# Patient Record
Sex: Female | Born: 1955
Health system: Southern US, Community
[De-identification: ages and names within clinical notes are randomized; demographics above are authoritative.]

## PROBLEM LIST (undated history)

## (undated) DIAGNOSIS — E785 Hyperlipidemia, unspecified: Secondary | ICD-10-CM

## (undated) DIAGNOSIS — E875 Hyperkalemia: Secondary | ICD-10-CM

## (undated) DIAGNOSIS — I639 Cerebral infarction, unspecified: Secondary | ICD-10-CM

## (undated) DIAGNOSIS — F17200 Nicotine dependence, unspecified, uncomplicated: Secondary | ICD-10-CM

## (undated) DIAGNOSIS — I720 Aneurysm of carotid artery: Secondary | ICD-10-CM

## (undated) HISTORY — DX: Hypercalcemia: E83.52

## (undated) HISTORY — DX: Hyperlipidemia, unspecified: E78.5

## (undated) HISTORY — DX: Nicotine dependence, unspecified, uncomplicated: F17.200

## (undated) HISTORY — DX: Hyperkalemia: E87.5

## (undated) HISTORY — DX: Aneurysm of carotid artery: I72.0

## (undated) HISTORY — PX: MOUTH SURGERY: SHX715

## (undated) HISTORY — PX: FRACTURE SURGERY: SHX138

---

## 2006-05-25 ENCOUNTER — Ambulatory Visit (HOSPITAL_COMMUNITY): Admission: RE | Admit: 2006-05-25 | Discharge: 2006-05-25 | Payer: Self-pay | Admitting: Obstetrics and Gynecology

## 2009-02-15 ENCOUNTER — Other Ambulatory Visit: Admission: RE | Admit: 2009-02-15 | Discharge: 2009-02-15 | Payer: Self-pay | Admitting: Obstetrics and Gynecology

## 2009-02-21 ENCOUNTER — Ambulatory Visit (HOSPITAL_COMMUNITY): Admission: RE | Admit: 2009-02-21 | Discharge: 2009-02-21 | Payer: Self-pay | Admitting: Obstetrics and Gynecology

## 2009-03-02 ENCOUNTER — Encounter: Payer: Self-pay | Admitting: Gastroenterology

## 2009-03-19 ENCOUNTER — Encounter: Payer: Self-pay | Admitting: Gastroenterology

## 2009-03-19 ENCOUNTER — Ambulatory Visit: Payer: Self-pay | Admitting: Gastroenterology

## 2009-03-19 ENCOUNTER — Ambulatory Visit (HOSPITAL_COMMUNITY): Admission: RE | Admit: 2009-03-19 | Discharge: 2009-03-19 | Payer: Self-pay | Admitting: Gastroenterology

## 2009-03-29 ENCOUNTER — Encounter (INDEPENDENT_AMBULATORY_CARE_PROVIDER_SITE_OTHER): Payer: Self-pay

## 2010-02-20 ENCOUNTER — Other Ambulatory Visit: Admission: RE | Admit: 2010-02-20 | Discharge: 2010-02-20 | Payer: Self-pay | Admitting: Obstetrics and Gynecology

## 2010-02-25 ENCOUNTER — Ambulatory Visit (HOSPITAL_COMMUNITY): Admission: RE | Admit: 2010-02-25 | Discharge: 2010-02-25 | Payer: Self-pay | Admitting: Obstetrics and Gynecology

## 2010-12-24 NOTE — Op Note (Signed)
NAMEMARKIYA, KEEFE               ACCOUNT NO.:  [id number]   MEDICAL RECORD NO.:  [id number]          PATIENT TYPE:  AMB   LOCATION:  DAY                           FACILITY:  [facility]   PHYSICIAN:  Kassie Mends, M.D.      DATE OF BIRTH:  1956/01/21   DATE OF PROCEDURE:  03/19/2009  DATE OF DISCHARGE:                               OPERATIVE REPORT   REFERRING Isel Skufca:  Victorino Dike L. Valentina Lucks, MD   PROCEDURE:  Colonoscopy with snare cautery and cold forceps polypectomy.   INDICATION FOR EXAM:  Ms. Knighton is a 55 year old female whose sister  had polyps.  She presents for screening.   FINDINGS:  Frequent sigmoid colon diverticula.  A 6-mm sessile sigmoid  colon polyp removed via snare cautery.  A 3-mm sessile rectal polyp  removed via cold forceps.  Small internal hemorrhoids.  Otherwise, no  masses, inflammatory changes, or AVMs seen.   RECOMMENDATIONS:  1. Screening colonoscopy in 10 years if she has a simple adenoma.  She      should have a screening colonoscopy in 5 years if her sister was      diagnosed with an advanced polyp at age less than 65.  2. She should follow a high-fiber diet.  She was given a handout on      high-fiber diet, polyps, diverticulosis, and hemorrhoids.  3. No aspirin, NSAIDs, or anticoagulation for 7 days.   MEDICATIONS:  1. Demerol 75 mg IV.  2. Versed 4 mg IV.   PROCEDURE TECHNIQUE:  Physical exam was performed.  Informed consent was  obtained from the patient after explaining the benefits, risks, and  alternatives to the procedure.  The patient was connected to the monitor  and placed in the left lateral position.  Continuous oxygen was provided  by nasal cannula and IV medicine administered through an indwelling  cannula.  After administration of sedation and rectal exam, the  patient's rectum was intubated, and the scope was advanced under direct  visualization to the cecum.  Scope was removed slowly by carefully  examining the color, texture,  anatomy, and integrity of the mucosa on  the way out.  The patient was recovered in endoscopy and discharged home  in satisfactory condition.   PATH:  SIMPLE ADENOMA. HYPERPLASTIC POLYP      Kassie Mends, M.D.  Electronically Signed     SM/MEDQ  D:  03/19/2009  T:  03/19/2009  Job:  [id number]   cc:   Tilda Burrow, M.D.  Fax: [phone]   Lise Auer. Valentina Lucks, MD

## 2011-03-03 ENCOUNTER — Other Ambulatory Visit: Payer: Self-pay | Admitting: Adult Health

## 2011-03-03 DIAGNOSIS — Z139 Encounter for screening, unspecified: Secondary | ICD-10-CM

## 2011-04-10 ENCOUNTER — Ambulatory Visit (HOSPITAL_COMMUNITY)
Admission: RE | Admit: 2011-04-10 | Discharge: 2011-04-10 | Disposition: A | Discharge status: Home / Self Care | Payer: BC Managed Care – PPO | Source: Ambulatory Visit | Attending: Adult Health | Admitting: Adult Health

## 2011-04-10 ENCOUNTER — Other Ambulatory Visit: Payer: Self-pay | Admitting: Adult Health

## 2011-04-10 ENCOUNTER — Other Ambulatory Visit (HOSPITAL_COMMUNITY)
Admission: RE | Admit: 2011-04-10 | Discharge: 2011-04-10 | Disposition: A | Discharge status: Home / Self Care | Payer: BC Managed Care – PPO | Source: Ambulatory Visit | Attending: Obstetrics and Gynecology | Admitting: Obstetrics and Gynecology

## 2011-04-10 DIAGNOSIS — Z1231 Encounter for screening mammogram for malignant neoplasm of breast: Secondary | ICD-10-CM | POA: Insufficient documentation

## 2011-04-10 DIAGNOSIS — Z01419 Encounter for gynecological examination (general) (routine) without abnormal findings: Secondary | ICD-10-CM | POA: Insufficient documentation

## 2011-04-10 DIAGNOSIS — Z139 Encounter for screening, unspecified: Secondary | ICD-10-CM

## 2011-09-19 ENCOUNTER — Ambulatory Visit (HOSPITAL_COMMUNITY)
Admission: RE | Admit: 2011-09-19 | Discharge: 2011-09-19 | Disposition: A | Discharge status: Home / Self Care | Payer: BC Managed Care – PPO | Source: Ambulatory Visit | Attending: Physician Assistant | Admitting: Physician Assistant

## 2011-09-19 ENCOUNTER — Other Ambulatory Visit (HOSPITAL_COMMUNITY): Payer: Self-pay | Admitting: Physician Assistant

## 2011-09-19 DIAGNOSIS — R209 Unspecified disturbances of skin sensation: Secondary | ICD-10-CM | POA: Insufficient documentation

## 2011-09-19 DIAGNOSIS — M5412 Radiculopathy, cervical region: Secondary | ICD-10-CM

## 2011-09-19 DIAGNOSIS — M542 Cervicalgia: Secondary | ICD-10-CM | POA: Insufficient documentation

## 2012-05-07 ENCOUNTER — Other Ambulatory Visit: Payer: Self-pay | Admitting: Adult Health

## 2012-05-07 DIAGNOSIS — Z139 Encounter for screening, unspecified: Secondary | ICD-10-CM

## 2012-06-11 ENCOUNTER — Other Ambulatory Visit (HOSPITAL_COMMUNITY)
Admission: RE | Admit: 2012-06-11 | Discharge: 2012-06-11 | Disposition: A | Discharge status: Home / Self Care | Payer: BC Managed Care – PPO | Source: Ambulatory Visit | Attending: Obstetrics and Gynecology | Admitting: Obstetrics and Gynecology

## 2012-06-11 ENCOUNTER — Other Ambulatory Visit: Payer: Self-pay | Admitting: Adult Health

## 2012-06-11 ENCOUNTER — Ambulatory Visit (HOSPITAL_COMMUNITY)
Admission: RE | Admit: 2012-06-11 | Discharge: 2012-06-11 | Disposition: A | Discharge status: Home / Self Care | Payer: BC Managed Care – PPO | Source: Ambulatory Visit | Attending: Adult Health | Admitting: Adult Health

## 2012-06-11 DIAGNOSIS — Z1151 Encounter for screening for human papillomavirus (HPV): Secondary | ICD-10-CM | POA: Insufficient documentation

## 2012-06-11 DIAGNOSIS — Z01419 Encounter for gynecological examination (general) (routine) without abnormal findings: Secondary | ICD-10-CM | POA: Insufficient documentation

## 2012-06-11 DIAGNOSIS — Z1231 Encounter for screening mammogram for malignant neoplasm of breast: Secondary | ICD-10-CM | POA: Insufficient documentation

## 2012-06-11 DIAGNOSIS — Z139 Encounter for screening, unspecified: Secondary | ICD-10-CM

## 2013-06-28 ENCOUNTER — Other Ambulatory Visit: Payer: Self-pay | Admitting: Adult Health

## 2013-06-28 DIAGNOSIS — Z139 Encounter for screening, unspecified: Secondary | ICD-10-CM

## 2013-06-30 ENCOUNTER — Ambulatory Visit (INDEPENDENT_AMBULATORY_CARE_PROVIDER_SITE_OTHER): Payer: BC Managed Care – PPO | Admitting: Adult Health

## 2013-06-30 ENCOUNTER — Encounter: Payer: Self-pay | Admitting: Adult Health

## 2013-06-30 VITALS — BP 116/70 | HR 68 | Ht 65.0 in | Wt 134.0 lb

## 2013-06-30 DIAGNOSIS — Z1212 Encounter for screening for malignant neoplasm of rectum: Secondary | ICD-10-CM

## 2013-06-30 DIAGNOSIS — Z01419 Encounter for gynecological examination (general) (routine) without abnormal findings: Secondary | ICD-10-CM

## 2013-06-30 LAB — LIPID PANEL
HDL: 76 mg/dL (ref >39)
LDL Cholesterol: 139 mg/dL — ABNORMAL HIGH (ref 0–99)
Total CHOL/HDL Ratio: 3 Ratio

## 2013-06-30 LAB — COMPREHENSIVE METABOLIC PANEL
ALT: 14 U/L (ref 0–35)
AST: 20 U/L (ref 0–37)
Alkaline Phosphatase: 72 U/L (ref 39–117)
Chloride: 104 mEq/L (ref 96–112)
Creat: 0.6 mg/dL (ref 0.50–1.10)
Total Bilirubin: 0.5 mg/dL (ref 0.3–1.2)

## 2013-06-30 LAB — CBC
HCT: 43.2 % (ref 36.0–46.0)
MCH: 31.5 pg (ref 26.0–34.0)
MCHC: 35 g/dL (ref 30.0–36.0)
MCV: 90 fL (ref 78.0–100.0)
RDW: 14.5 % (ref 11.5–15.5)

## 2013-06-30 LAB — HEMOCCULT GUIAC POC 1CARD (OFFICE): Fecal Occult Blood, POC: NEGATIVE

## 2013-06-30 LAB — TSH: TSH: 0.615 u[IU]/mL (ref 0.350–4.500)

## 2013-06-30 NOTE — Patient Instructions (Signed)
Physical in 1 year Mammogram 12/1 Follow up  Labs next week

## 2013-06-30 NOTE — Progress Notes (Signed)
Patient ID: Denise Maynard, female   DOB: 01/30/1956, 57 y.o.   MRN: [id number] History of Present Illness: Denise Maynard is a 57 year old white female in for physical.She had a normal pap with negative HPV 06/11/12. Got flu shot at work.  Current Medications, Allergies, Past Medical History, Past Surgical History, Family History and Social History were reviewed in [employer] record.     Review of Systems: Patient denies any headaches, blurred vision, shortness of breath, chest pain, abdominal pain, problems with bowel movements, urination, or intercourse.no joint pain or mood swings, has had dental surgery this year.    Physical Exam:BP 116/70  Pulse 68  Ht 5\' 5"  (1.651 m)  Wt 134 lb (60.782 kg)  BMI 22.30 kg/m2 General:  Well developed, well nourished, no acute distress Skin:  Warm and dry Neck:  Midline trachea, normal thyroid Lungs; Clear to auscultation bilaterally Breast:  No dominant palpable mass, retraction, or nipple discharge Cardiovascular: Regular rate and rhythm Abdomen:  Soft, non tender, no hepatosplenomegaly Pelvic:  External genitalia is normal in appearance.  The vagina is normal in appearance for age..               The cervix is atrophic.  Uterus is felt to be normal size, shape, and contour.  No   adnexal masses or tenderness noted. Rectal: Good sphincter tone, no polyps, or hemorrhoids felt.  Hemoccult negative. Extremities:  No swelling or varicosities noted Psych:  No mood changes, alert and cooperative, seems happy   Impression: Yearly gyn exam no pap   Plan: Physical in 1 year Mammogram 12/1 and yearly Colonoscopy per GI(had polyp removed with last one) Check CBC,CMP,TSH and lipids

## 2013-07-04 ENCOUNTER — Telehealth: Payer: Self-pay | Admitting: Adult Health

## 2013-07-04 NOTE — Telephone Encounter (Signed)
Left message labs back and they are fairly good

## 2013-07-11 ENCOUNTER — Ambulatory Visit (HOSPITAL_COMMUNITY)
Admission: RE | Admit: 2013-07-11 | Discharge: 2013-07-11 | Disposition: A | Discharge status: Home / Self Care | Payer: BC Managed Care – PPO | Source: Ambulatory Visit | Attending: Adult Health | Admitting: Adult Health

## 2013-07-11 DIAGNOSIS — Z139 Encounter for screening, unspecified: Secondary | ICD-10-CM

## 2013-07-11 DIAGNOSIS — Z1231 Encounter for screening mammogram for malignant neoplasm of breast: Secondary | ICD-10-CM | POA: Insufficient documentation

## 2014-06-12 ENCOUNTER — Encounter: Payer: Self-pay | Admitting: Adult Health

## 2014-07-03 ENCOUNTER — Ambulatory Visit (INDEPENDENT_AMBULATORY_CARE_PROVIDER_SITE_OTHER): Payer: BC Managed Care – PPO | Admitting: Adult Health

## 2014-07-03 ENCOUNTER — Encounter: Payer: Self-pay | Admitting: Adult Health

## 2014-07-03 VITALS — BP 142/84 | HR 76 | Ht 65.0 in | Wt 132.0 lb

## 2014-07-03 DIAGNOSIS — Z139 Encounter for screening, unspecified: Secondary | ICD-10-CM

## 2014-07-03 DIAGNOSIS — R519 Headache, unspecified: Secondary | ICD-10-CM | POA: Insufficient documentation

## 2014-07-03 DIAGNOSIS — Z1212 Encounter for screening for malignant neoplasm of rectum: Secondary | ICD-10-CM

## 2014-07-03 DIAGNOSIS — Z01419 Encounter for gynecological examination (general) (routine) without abnormal findings: Secondary | ICD-10-CM

## 2014-07-03 DIAGNOSIS — R51 Headache: Secondary | ICD-10-CM

## 2014-07-03 LAB — LIPID PANEL
CHOLESTEROL: 229 mg/dL — AB (ref 0–200)
HDL: 75 mg/dL (ref >39)
LDL Cholesterol: 131 mg/dL — ABNORMAL HIGH (ref 0–99)
Total CHOL/HDL Ratio: 3.1 Ratio
Triglycerides: 113 mg/dL (ref <150)
VLDL: 23 mg/dL (ref 0–40)

## 2014-07-03 LAB — COMPREHENSIVE METABOLIC PANEL
ALBUMIN: 4.5 g/dL (ref 3.5–5.2)
ALK PHOS: 74 U/L (ref 39–117)
ALT: 13 U/L (ref 0–35)
AST: 18 U/L (ref 0–37)
BUN: 8 mg/dL (ref 6–23)
CALCIUM: 9.8 mg/dL (ref 8.4–10.5)
CHLORIDE: 106 meq/L (ref 96–112)
CO2: 24 meq/L (ref 19–32)
Creat: 0.46 mg/dL — ABNORMAL LOW (ref 0.50–1.10)
GLUCOSE: 86 mg/dL (ref 70–99)
POTASSIUM: 4.4 meq/L (ref 3.5–5.3)
SODIUM: 139 meq/L (ref 135–145)
TOTAL PROTEIN: 7 g/dL (ref 6.0–8.3)
Total Bilirubin: 0.4 mg/dL (ref 0.2–1.2)

## 2014-07-03 LAB — CBC
HEMATOCRIT: 40.8 % (ref 36.0–46.0)
HEMOGLOBIN: 14.7 g/dL (ref 12.0–15.0)
MCH: 32.2 pg (ref 26.0–34.0)
MCHC: 36 g/dL (ref 30.0–36.0)
MCV: 89.3 fL (ref 78.0–100.0)
MPV: 9.9 fL (ref 9.4–12.4)
Platelets: 498 10*3/uL — ABNORMAL HIGH (ref 150–400)
RBC: 4.57 MIL/uL (ref 3.87–5.11)
RDW: 14.9 % (ref 11.5–15.5)
WBC: 10.4 10*3/uL (ref 4.0–10.5)

## 2014-07-03 LAB — HEMOCCULT GUIAC POC 1CARD (OFFICE): Fecal Occult Blood, POC: NEGATIVE

## 2014-07-03 NOTE — Patient Instructions (Signed)
Pap and physical in 1 year mammogram yearly  referred Dr Oneida Alar for colonoscopy MRI 11/14 at 8 pm

## 2014-07-03 NOTE — Progress Notes (Signed)
Patient ID: Denise Maynard, female   DOB: June 30, 1956, 58 y.o.   MRN: [id number] History of Present Illness: Denise Maynard is a 58 year old white female in for gyn exam.She had a normal pap with negative HPV 06/11/12. Got flu shot at work.  Current Medications, Allergies, Past Medical History, Past Surgical History, Family History and Social History were reviewed in [employer] record.     Review of Systems: Patient denies any  blurred vision, shortness of breath, chest pain, abdominal pain, problems with bowel movements, urination, or intercourse. No joint pain or mood swings.She has pressure and pounding in head, has had over a head, told PCP was told to sleep with head elevated.Has had some floaters and eye doctor aware, better with new glasses.No loss of vision or nausea associated with pressure.    Physical Exam:BP 142/84 mmHg  Pulse 76  Ht 5\' 5"  (1.651 m)  Wt 132 lb (59.875 kg)  BMI 21.97 kg/m2 General:  Well developed, well nourished, no acute distress Skin:  Warm and dry Neck:  Midline trachea, normal thyroid, no carotid bruits, CN 2-12 intact but has horizontal nystigmus Lungs; Clear to auscultation bilaterally Breast:  No dominant palpable mass, retraction, or nipple discharge Cardiovascular: Regular rate and rhythm Abdomen:  Soft, non tender, no hepatosplenomegaly Pelvic:  External genitalia is normal in appearance, no lesions.  The vagina has decrease in color, moisture and rugae. The cervix is smooth.  Uterus is felt to be normal size, shape, and contour.  No       adnexal masses or tenderness noted. Rectal: Good sphincter tone, no polyps, or hemorrhoids felt.  Hemoccult negative. Extremities:  No swelling or varicosities noted Psych:  No mood changes,alert and cooperative,seems happy   Impression: Well woman exam no pap Pressure in head    Plan: Refer to Dr Oneida Alar for colonoscopy Check CBC,CMP,TSH and lipids MRI brain wo contrast 11/14 at 8 pm at  [facility] Pap and physical in 1 year Mammogram yearly

## 2014-07-04 ENCOUNTER — Telehealth: Payer: Self-pay | Admitting: Adult Health

## 2014-07-04 ENCOUNTER — Ambulatory Visit (HOSPITAL_COMMUNITY)
Admission: RE | Admit: 2014-07-04 | Discharge: 2014-07-04 | Disposition: A | Discharge status: Home / Self Care | Payer: BC Managed Care – PPO | Source: Ambulatory Visit | Attending: Adult Health | Admitting: Adult Health

## 2014-07-04 DIAGNOSIS — H5509 Other forms of nystagmus: Secondary | ICD-10-CM | POA: Diagnosis not present

## 2014-07-04 DIAGNOSIS — I7789 Other specified disorders of arteries and arterioles: Secondary | ICD-10-CM | POA: Diagnosis not present

## 2014-07-04 DIAGNOSIS — R51 Headache: Secondary | ICD-10-CM | POA: Insufficient documentation

## 2014-07-04 DIAGNOSIS — R519 Headache, unspecified: Secondary | ICD-10-CM

## 2014-07-04 LAB — TSH: TSH: 1.375 u[IU]/mL (ref 0.350–4.500)

## 2014-07-04 NOTE — Telephone Encounter (Signed)
Left message labs look good, work on cholesterol, call call me back

## 2014-07-05 ENCOUNTER — Telehealth: Payer: Self-pay | Admitting: Adult Health

## 2014-07-05 DIAGNOSIS — R9089 Other abnormal findings on diagnostic imaging of central nervous system: Secondary | ICD-10-CM

## 2014-07-05 NOTE — Telephone Encounter (Signed)
Left message to call me at MRI

## 2014-07-05 NOTE — Telephone Encounter (Signed)
Pt aware of MRI results and possible aneurysm , will schedule CTA of head

## 2014-07-05 NOTE — Telephone Encounter (Signed)
Pt aware has CTA 12/1 at 7 pm at [facility] be there at 6:30 pm and do not eat or drink after 4 pm

## 2014-07-11 ENCOUNTER — Ambulatory Visit (HOSPITAL_COMMUNITY)
Admission: RE | Admit: 2014-07-11 | Discharge: 2014-07-11 | Disposition: A | Discharge status: Home / Self Care | Payer: BC Managed Care – PPO | Source: Ambulatory Visit | Attending: Adult Health | Admitting: Adult Health

## 2014-07-11 DIAGNOSIS — R930 Abnormal findings on diagnostic imaging of skull and head, not elsewhere classified: Secondary | ICD-10-CM | POA: Insufficient documentation

## 2014-07-11 DIAGNOSIS — R9089 Other abnormal findings on diagnostic imaging of central nervous system: Secondary | ICD-10-CM

## 2014-07-11 DIAGNOSIS — I671 Cerebral aneurysm, nonruptured: Secondary | ICD-10-CM | POA: Diagnosis not present

## 2014-07-11 MED ORDER — IOHEXOL 350 MG/ML SOLN
80.0000 mL | Freq: Once | INTRAVENOUS | Status: AC | PRN
  Administered 2014-07-11: 80 mL via INTRAVENOUS

## 2014-07-12 ENCOUNTER — Telehealth: Payer: Self-pay | Admitting: Adult Health

## 2014-07-12 ENCOUNTER — Telehealth: Payer: Self-pay | Admitting: *Deleted

## 2014-07-12 NOTE — Telephone Encounter (Signed)
Opal Sidles from [facility] called states CT Angiography Head confirmed right ICA terminus saccular aneurysm, no other intracranial aneurysm. Derrek Monaco, NP ordering provider given copy of CT report and notified of call.

## 2014-07-12 NOTE — Telephone Encounter (Signed)
Has appt 12/7 at 11:15 am with neurosurgeon.

## 2014-07-12 NOTE — Telephone Encounter (Signed)
Pt aware of CTA results with positive aneurysm, will refer to Dr Kathyrn Sheriff, fax [phone], phone [phone], they will call her for appt.,have sent MRi and CTA with notes and demographics.

## 2014-07-17 ENCOUNTER — Other Ambulatory Visit: Payer: Self-pay | Admitting: Adult Health

## 2014-07-17 DIAGNOSIS — Z1231 Encounter for screening mammogram for malignant neoplasm of breast: Secondary | ICD-10-CM

## 2014-07-19 ENCOUNTER — Other Ambulatory Visit (HOSPITAL_COMMUNITY): Payer: Self-pay | Admitting: Neurosurgery

## 2014-07-19 DIAGNOSIS — I729 Aneurysm of unspecified site: Secondary | ICD-10-CM

## 2014-07-24 ENCOUNTER — Ambulatory Visit (HOSPITAL_COMMUNITY)
Admission: RE | Admit: 2014-07-24 | Discharge: 2014-07-24 | Disposition: A | Discharge status: Home / Self Care | Payer: BC Managed Care – PPO | Source: Ambulatory Visit | Attending: Adult Health | Admitting: Adult Health

## 2014-07-24 DIAGNOSIS — Z1231 Encounter for screening mammogram for malignant neoplasm of breast: Secondary | ICD-10-CM | POA: Diagnosis present

## 2014-07-25 ENCOUNTER — Telehealth: Payer: Self-pay | Admitting: Adult Health

## 2014-07-25 NOTE — Telephone Encounter (Signed)
She saw neurosurgeon and  Is getting angiogram 12/30, she had mammogram yesterday, told her to keep me posted

## 2014-08-09 ENCOUNTER — Ambulatory Visit (HOSPITAL_COMMUNITY)
Admission: RE | Admit: 2014-08-09 | Discharge: 2014-08-09 | Disposition: A | Discharge status: Home / Self Care | Payer: BC Managed Care – PPO | Source: Ambulatory Visit | Attending: Neurosurgery | Admitting: Neurosurgery

## 2014-08-09 ENCOUNTER — Other Ambulatory Visit (HOSPITAL_COMMUNITY): Payer: Self-pay | Admitting: Neurosurgery

## 2014-08-09 DIAGNOSIS — F17210 Nicotine dependence, cigarettes, uncomplicated: Secondary | ICD-10-CM | POA: Insufficient documentation

## 2014-08-09 DIAGNOSIS — I729 Aneurysm of unspecified site: Secondary | ICD-10-CM

## 2014-08-09 DIAGNOSIS — I671 Cerebral aneurysm, nonruptured: Secondary | ICD-10-CM | POA: Diagnosis present

## 2014-08-09 DIAGNOSIS — I10 Essential (primary) hypertension: Secondary | ICD-10-CM | POA: Diagnosis not present

## 2014-08-09 DIAGNOSIS — Z7982 Long term (current) use of aspirin: Secondary | ICD-10-CM | POA: Diagnosis not present

## 2014-08-09 DIAGNOSIS — E119 Type 2 diabetes mellitus without complications: Secondary | ICD-10-CM | POA: Insufficient documentation

## 2014-08-09 LAB — BASIC METABOLIC PANEL
ANION GAP: 8 (ref 5–15)
BUN: 9 mg/dL (ref 6–23)
CO2: 27 mmol/L (ref 19–32)
Calcium: 9.6 mg/dL (ref 8.4–10.5)
Chloride: 105 mEq/L (ref 96–112)
Creatinine, Ser: 0.59 mg/dL (ref 0.50–1.10)
GFR calc Af Amer: >90 mL/min (ref >90)
Glucose, Bld: 82 mg/dL (ref 70–99)
POTASSIUM: 3.7 mmol/L (ref 3.5–5.1)
SODIUM: 140 mmol/L (ref 135–145)

## 2014-08-09 LAB — PROTIME-INR
INR: 0.94 (ref 0.00–1.49)
Prothrombin Time: 12.7 seconds (ref 11.6–15.2)

## 2014-08-09 LAB — CBC
HCT: 43 % (ref 36.0–46.0)
HEMOGLOBIN: 14.8 g/dL (ref 12.0–15.0)
MCH: 32.2 pg (ref 26.0–34.0)
MCHC: 34.4 g/dL (ref 30.0–36.0)
MCV: 93.7 fL (ref 78.0–100.0)
Platelets: 422 10*3/uL — ABNORMAL HIGH (ref 150–400)
RBC: 4.59 MIL/uL (ref 3.87–5.11)
RDW: 14.7 % (ref 11.5–15.5)
WBC: 10.8 10*3/uL — AB (ref 4.0–10.5)

## 2014-08-09 LAB — APTT: aPTT: 32 seconds (ref 24–37)

## 2014-08-09 MED ORDER — HYDROCODONE-ACETAMINOPHEN 5-325 MG PO TABS: 1.0000 | ORAL_TABLET | ORAL | Status: DC | PRN

## 2014-08-09 MED ORDER — FENTANYL CITRATE 0.05 MG/ML IJ SOLN
INTRAMUSCULAR | Status: AC
  Filled 2014-08-09: qty 2

## 2014-08-09 MED ORDER — HEPARIN SODIUM (PORCINE) 1000 UNIT/ML IJ SOLN
INTRAMUSCULAR | Status: AC | PRN
  Administered 2014-08-09: 2000 [IU] via INTRAVENOUS

## 2014-08-09 MED ORDER — SODIUM CHLORIDE 0.9 % IV SOLN: INTRAVENOUS | Status: DC

## 2014-08-09 MED ORDER — HEPARIN SODIUM (PORCINE) 1000 UNIT/ML IJ SOLN
INTRAMUSCULAR | Status: AC
  Filled 2014-08-09: qty 1

## 2014-08-09 MED ORDER — MIDAZOLAM HCL 2 MG/2ML IJ SOLN
INTRAMUSCULAR | Status: AC
  Filled 2014-08-09: qty 2

## 2014-08-09 MED ORDER — IOHEXOL 300 MG/ML  SOLN
150.0000 mL | Freq: Once | INTRAMUSCULAR | Status: AC | PRN
  Administered 2014-08-09: 1 mL via INTRA_ARTERIAL

## 2014-08-09 MED ORDER — LIDOCAINE HCL 1 % IJ SOLN
INTRAMUSCULAR | Status: AC
  Filled 2014-08-09: qty 20

## 2014-08-09 MED ORDER — FENTANYL CITRATE 0.05 MG/ML IJ SOLN
INTRAMUSCULAR | Status: AC | PRN
  Administered 2014-08-09: 25 ug via INTRAVENOUS

## 2014-08-09 MED ORDER — MIDAZOLAM HCL 2 MG/2ML IJ SOLN
INTRAMUSCULAR | Status: AC | PRN
  Administered 2014-08-09: 1 mg via INTRAVENOUS

## 2014-08-09 NOTE — Brief Op Note (Signed)
PREOP DX: RICA aneurysm  POSTOP DX: Same  PROCEDURE: Diagnostic cerebral angiogram  SURGEON: Dr. Consuella Lose, MD  ANESTHESIA: IV Sedation with Local  EBL: Minimal  SPECIMENS: None  COMPLICATIONS: None  CONDITION: Stable to recovery  FINDINGS: 1. 51mm x 68mm Right Pcom aneurysm projects laterally with the origin of the Pcom at the neck of the aneurysm 2. No other aneurysms, AVM, or high-flow fistulas

## 2014-08-09 NOTE — H&P (Signed)
CC:  Aneurysm  HPI: LORELEE Maynard is a 58 y.o. female with a incidentally found right ICA aneurysm, seen on CTA and MRI done for HA. She has a history of tobacco smoking, and no FH of aneurysms.  PMH: Past Medical History  Diagnosis Date  . Pressure in head 07/03/2014    PSH: Past Surgical History  Procedure Laterality Date  . Mouth surgery      SH: History  Substance Use Topics  . Smoking status: Current Every Day Smoker -- 0.75 packs/day for 20 years    Types: Cigarettes  . Smokeless tobacco: Never Used  . Alcohol Use: No    MEDS: Prior to Admission medications   Medication Sig Start Date End Date Taking? Authorizing Provider  aspirin 81 MG tablet Take 81 mg by mouth daily.   Yes Historical Provider, MD  Cholecalciferol (VITAMIN D) 2000 UNITS CAPS Take 2,000 Units by mouth daily.   Yes Historical Provider, MD  Multiple Vitamins-Minerals (MULTIVITAMIN WITH MINERALS) tablet Take 1 tablet by mouth daily.   Yes Historical Provider, MD  Omega 3 1200 MG CAPS Take 1,200 mg by mouth 2 (two) times daily.   Yes Historical Provider, MD    ALLERGY: No Known Allergies  ROS: ROS  NEUROLOGIC EXAM: Awake, alert, oriented Memory and concentration grossly intact Speech fluent, appropriate CN grossly intact Motor exam: Upper Extremities Deltoid Bicep Tricep Grip  Right 5/5 5/5 5/5 5/5  Left 5/5 5/5 5/5 5/5   Lower Extremity IP Quad PF DF EHL  Right 5/5 5/5 5/5 5/5 5/5  Left 5/5 5/5 5/5 5/5 5/5   Sensation grossly intact to LT  [facility]: CTA demonstrates laterally projecting 5-24mm distal supraclinoid RICA aneurysm  IMPRESSION: - 58 y.o. female with incidentally discovered RICA aneurysm  PLAN: - Proceed with diagnostic cerebral angiogram - Likely home post-procedure  The risks and benefits of the angiogram were reviewed in detail with the patient to include but are not limitied to stroke, hematoma, nephropathy, HA, contrast reaction. She provided consent after all  questions were answered.

## 2014-08-09 NOTE — Sedation Documentation (Signed)
Exoseal in place- holding pressure to R groin

## 2014-08-09 NOTE — Discharge Instructions (Signed)
Angiogram, Care After °Refer to this sheet in the next few weeks. These instructions provide you with information on caring for yourself after your procedure. Your health care provider may also give you more specific instructions. Your treatment has been planned according to current medical practices, but problems sometimes occur. Call your health care provider if you have any problems or questions after your procedure.  °WHAT TO EXPECT AFTER THE PROCEDURE °After your procedure, it is typical to have the following sensations: °· Minor discomfort or tenderness and a small bump at the catheter insertion site. The bump should usually decrease in size and tenderness within 1 to 2 weeks. °· Any bruising will usually fade within 2 to 4 weeks. °HOME CARE INSTRUCTIONS  °· You may need to keep taking blood thinners if they were prescribed for you. Take medicines only as directed by your health care provider. °· Do not apply powder or lotion to the site. °· Do not take baths, swim, or use a hot tub until your health care provider approves. °· You may shower 24 hours after the procedure. Remove the bandage (dressing) and gently wash the site with plain soap and water. Gently pat the site dry. °· Inspect the site at least twice daily. °· Limit your activity for the first 48 hours. Do not bend, squat, or lift anything over 20 lb (9 kg) or as directed by your health care provider. °· Plan to have someone take you home after the procedure. Follow instructions about when you can drive or return to work. °SEEK MEDICAL CARE IF: °· You get light-headed when standing up. °· You have drainage (other than a small amount of blood on the dressing). °· You have chills. °· You have a fever. °· You have redness, warmth, swelling, or pain at the insertion site. °SEEK IMMEDIATE MEDICAL CARE IF:  °· You develop chest pain or shortness of breath, feel faint, or pass out. °· You have bleeding, swelling larger than a walnut, or drainage from the  catheter insertion site. °· You develop pain, discoloration, coldness, or severe bruising in the leg or arm that held the catheter. °· You have heavy bleeding from the site. If this happens, hold pressure on the site and call 911. °MAKE SURE YOU: °· Understand these instructions. °· Will watch your condition. °· Will get help right away if you are not doing well or get worse. °Document Released: 02/13/2005 Document Revised: 12/12/2013 Document Reviewed: 12/20/2012 °ExitCare® Patient Information ©2015 [employer]. This information is not intended to replace advice given to you by your health care provider. Make sure you discuss any questions you have with your health care provider. ° °

## 2014-08-09 NOTE — Sedation Documentation (Signed)
Pulses WNL

## 2014-08-29 ENCOUNTER — Other Ambulatory Visit (HOSPITAL_COMMUNITY): Payer: Self-pay | Admitting: Neurosurgery

## 2014-08-29 DIAGNOSIS — I671 Cerebral aneurysm, nonruptured: Secondary | ICD-10-CM

## 2014-09-06 ENCOUNTER — Telehealth: Payer: Self-pay | Admitting: Adult Health

## 2014-09-06 NOTE — Telephone Encounter (Signed)
Pt called to let me know that she is having a wire placed in her aneurysm 10/12/14

## 2014-09-29 ENCOUNTER — Encounter (HOSPITAL_COMMUNITY)
Admission: RE | Admit: 2014-09-29 | Discharge: 2014-09-29 | Disposition: A | Discharge status: Home / Self Care | Payer: 59 | Source: Ambulatory Visit | Attending: Neurosurgery | Admitting: Neurosurgery

## 2014-09-29 ENCOUNTER — Encounter (HOSPITAL_COMMUNITY): Payer: Self-pay

## 2014-09-29 DIAGNOSIS — I671 Cerebral aneurysm, nonruptured: Secondary | ICD-10-CM | POA: Diagnosis not present

## 2014-09-29 DIAGNOSIS — Z01812 Encounter for preprocedural laboratory examination: Secondary | ICD-10-CM | POA: Insufficient documentation

## 2014-09-29 LAB — CBC
HEMATOCRIT: 43.6 % (ref 36.0–46.0)
HEMOGLOBIN: 15 g/dL (ref 12.0–15.0)
MCH: 31.5 pg (ref 26.0–34.0)
MCHC: 34.4 g/dL (ref 30.0–36.0)
MCV: 91.6 fL (ref 78.0–100.0)
Platelets: 463 10*3/uL — ABNORMAL HIGH (ref 150–400)
RBC: 4.76 MIL/uL (ref 3.87–5.11)
RDW: 14.6 % (ref 11.5–15.5)
WBC: 9.4 10*3/uL (ref 4.0–10.5)

## 2014-09-29 NOTE — Pre-Procedure Instructions (Signed)
Denise Maynard  09/29/2014   Your procedure is scheduled on:  10-12-2014  Thursday   Report to [facility] Admitting at 6:00 AM.  Call this number if you have problems the morning of surgery: [phone]   Remember:   Do not eat food or drink liquids after midnight.   Take these medicines the morning of surgery with A SIP OF WATER: Tylenol if needed   Do not wear jewelry, make-up or nail polish.   Do not wear lotions, powders, or perfumes. You may not wear deodorant.  Do not shave 48 hours prior to surgery.   Do not bring valuables to the hospital.  [facility] is not responsible  for any belongings or valuables.               Contacts, dentures or bridgework may not be worn into surgery.   Leave suitcase in the car. After surgery it may be brought to your room.  For patients admitted to the hospital, discharge time is determined by your  treatment team.               Patients discharged the day of surgery will not be allowed to drive home.    Special Instructions: See attached sheet for instructions on CHG shower/bath   Please read over the following fact sheets that you were given: Pain Booklet and Surgical Site Infection Prevention

## 2014-10-10 HISTORY — PX: OTHER SURGICAL HISTORY: SHX169

## 2014-10-11 MED ORDER — CEFAZOLIN SODIUM-DEXTROSE 2-3 GM-% IV SOLR
2.0000 g | INTRAVENOUS | Status: AC
  Administered 2014-10-12: 2 g via INTRAVENOUS
  Filled 2014-10-11 (×2): qty 50

## 2014-10-12 ENCOUNTER — Encounter (HOSPITAL_COMMUNITY)
Admission: RE | Disposition: A | Discharge status: Home / Self Care | Payer: Self-pay | Source: Ambulatory Visit | Attending: Neurosurgery

## 2014-10-12 ENCOUNTER — Ambulatory Visit (HOSPITAL_COMMUNITY)
Admission: RE | Admit: 2014-10-12 | Discharge: 2014-10-12 | Disposition: A | Discharge status: Home / Self Care | Payer: 59 | Source: Ambulatory Visit | Attending: Neurosurgery | Admitting: Neurosurgery

## 2014-10-12 ENCOUNTER — Encounter (HOSPITAL_COMMUNITY): Payer: Self-pay | Admitting: *Deleted

## 2014-10-12 ENCOUNTER — Inpatient Hospital Stay (HOSPITAL_COMMUNITY): Payer: 59 | Admitting: Anesthesiology

## 2014-10-12 ENCOUNTER — Inpatient Hospital Stay (HOSPITAL_COMMUNITY)
Admission: RE | Admit: 2014-10-12 | Discharge: 2014-10-13 | DRG: 027 | Disposition: A | Discharge status: Home / Self Care | Payer: 59 | Source: Ambulatory Visit | Attending: Neurosurgery | Admitting: Neurosurgery

## 2014-10-12 DIAGNOSIS — I671 Cerebral aneurysm, nonruptured: Secondary | ICD-10-CM | POA: Diagnosis present

## 2014-10-12 DIAGNOSIS — Z23 Encounter for immunization: Secondary | ICD-10-CM | POA: Diagnosis not present

## 2014-10-12 DIAGNOSIS — Z7982 Long term (current) use of aspirin: Secondary | ICD-10-CM | POA: Diagnosis not present

## 2014-10-12 DIAGNOSIS — F17210 Nicotine dependence, cigarettes, uncomplicated: Secondary | ICD-10-CM | POA: Diagnosis present

## 2014-10-12 HISTORY — PX: RADIOLOGY WITH ANESTHESIA: SHX6223

## 2014-10-12 LAB — CBC
HCT: 38.4 % (ref 36.0–46.0)
Hemoglobin: 13.3 g/dL (ref 12.0–15.0)
MCH: 32 pg (ref 26.0–34.0)
MCHC: 34.6 g/dL (ref 30.0–36.0)
MCV: 92.3 fL (ref 78.0–100.0)
Platelets: 415 10*3/uL — ABNORMAL HIGH (ref 150–400)
RBC: 4.16 MIL/uL (ref 3.87–5.11)
RDW: 14.8 % (ref 11.5–15.5)
WBC: 8.2 10*3/uL (ref 4.0–10.5)

## 2014-10-12 LAB — CREATININE, SERUM
Creatinine, Ser: 0.67 mg/dL (ref 0.50–1.10)
GFR calc Af Amer: >90 mL/min (ref >90)

## 2014-10-12 LAB — MRSA PCR SCREENING: MRSA by PCR: NEGATIVE

## 2014-10-12 SURGERY — RADIOLOGY WITH ANESTHESIA
Anesthesia: Monitor Anesthesia Care

## 2014-10-12 MED ORDER — FENTANYL CITRATE 0.05 MG/ML IJ SOLN
INTRAMUSCULAR | Status: DC | PRN
  Administered 2014-10-12: 150 ug via INTRAVENOUS
  Administered 2014-10-12: 100 ug via INTRAVENOUS

## 2014-10-12 MED ORDER — PROMETHAZINE HCL 25 MG/ML IJ SOLN: 6.2500 mg | INTRAMUSCULAR | Status: DC | PRN

## 2014-10-12 MED ORDER — PROPOFOL 10 MG/ML IV BOLUS
INTRAVENOUS | Status: DC | PRN
  Administered 2014-10-12: 50 mg via INTRAVENOUS
  Administered 2014-10-12: 100 mg via INTRAVENOUS

## 2014-10-12 MED ORDER — LABETALOL HCL 5 MG/ML IV SOLN
INTRAVENOUS | Status: DC | PRN
  Administered 2014-10-12: 5 mg via INTRAVENOUS

## 2014-10-12 MED ORDER — LACTATED RINGERS IV SOLN: INTRAVENOUS | Status: DC

## 2014-10-12 MED ORDER — HEPARIN SODIUM (PORCINE) 1000 UNIT/ML IJ SOLN
INTRAMUSCULAR | Status: DC | PRN
  Administered 2014-10-12: 5000 [IU] via INTRAVENOUS
  Administered 2014-10-12: 1000 [IU] via INTRAVENOUS

## 2014-10-12 MED ORDER — ACETAMINOPHEN 500 MG PO TABS: 500.0000 mg | ORAL_TABLET | Freq: Four times a day (QID) | ORAL | Status: DC | PRN

## 2014-10-12 MED ORDER — MIDAZOLAM HCL 5 MG/5ML IJ SOLN
INTRAMUSCULAR | Status: DC | PRN
  Administered 2014-10-12: 1 mg via INTRAVENOUS

## 2014-10-12 MED ORDER — ADULT MULTIVITAMIN W/MINERALS CH
1.0000 | ORAL_TABLET | Freq: Every day | ORAL | Status: DC
  Administered 2014-10-12 – 2014-10-13 (×2): 1 via ORAL
  Filled 2014-10-12 (×2): qty 1

## 2014-10-12 MED ORDER — HYDROCODONE-ACETAMINOPHEN 5-325 MG PO TABS: 1.0000 | ORAL_TABLET | ORAL | Status: DC | PRN

## 2014-10-12 MED ORDER — MULTI-VITAMIN/MINERALS PO TABS: 1.0000 | ORAL_TABLET | Freq: Every day | ORAL | Status: DC

## 2014-10-12 MED ORDER — IOHEXOL 300 MG/ML  SOLN
150.0000 mL | Freq: Once | INTRAMUSCULAR | Status: AC | PRN
  Administered 2014-10-12: 50 mL via INTRA_ARTERIAL

## 2014-10-12 MED ORDER — ASPIRIN 81 MG PO CHEW
81.0000 mg | CHEWABLE_TABLET | Freq: Every day | ORAL | Status: DC
  Administered 2014-10-12 – 2014-10-13 (×2): 81 mg via ORAL
  Filled 2014-10-12 (×2): qty 1

## 2014-10-12 MED ORDER — PHENYLEPHRINE HCL 10 MG/ML IJ SOLN
10.0000 mg | INTRAVENOUS | Status: DC | PRN
  Administered 2014-10-12: 20 ug/min via INTRAVENOUS

## 2014-10-12 MED ORDER — MEPERIDINE HCL 25 MG/ML IJ SOLN: 6.2500 mg | INTRAMUSCULAR | Status: DC | PRN

## 2014-10-12 MED ORDER — OMEGA-3-ACID ETHYL ESTERS 1 G PO CAPS
1.0000 g | ORAL_CAPSULE | Freq: Two times a day (BID) | ORAL | Status: DC
  Administered 2014-10-12 – 2014-10-13 (×2): 1 g via ORAL
  Filled 2014-10-12 (×3): qty 1

## 2014-10-12 MED ORDER — HEPARIN SODIUM (PORCINE) 5000 UNIT/ML IJ SOLN
5000.0000 [IU] | Freq: Three times a day (TID) | INTRAMUSCULAR | Status: DC
  Administered 2014-10-13: 5000 [IU] via SUBCUTANEOUS
  Filled 2014-10-12 (×4): qty 1

## 2014-10-12 MED ORDER — OMEGA 3 1200 MG PO CAPS: 1200.0000 mg | ORAL_CAPSULE | Freq: Two times a day (BID) | ORAL | Status: DC

## 2014-10-12 MED ORDER — ONDANSETRON HCL 4 MG/2ML IJ SOLN
INTRAMUSCULAR | Status: DC | PRN
  Administered 2014-10-12: 4 mg via INTRAVENOUS

## 2014-10-12 MED ORDER — LACTATED RINGERS IV SOLN
INTRAVENOUS | Status: DC | PRN
  Administered 2014-10-12 (×2): via INTRAVENOUS

## 2014-10-12 MED ORDER — FENTANYL CITRATE 0.05 MG/ML IJ SOLN: 25.0000 ug | INTRAMUSCULAR | Status: DC | PRN

## 2014-10-12 MED ORDER — PHENYLEPHRINE HCL 10 MG/ML IJ SOLN
INTRAMUSCULAR | Status: DC | PRN
  Administered 2014-10-12: 80 ug via INTRAVENOUS
  Administered 2014-10-12 (×2): 40 ug via INTRAVENOUS

## 2014-10-12 MED ORDER — ROCURONIUM BROMIDE 100 MG/10ML IV SOLN
INTRAVENOUS | Status: DC | PRN
  Administered 2014-10-12: 40 mg via INTRAVENOUS
  Administered 2014-10-12: 10 mg via INTRAVENOUS

## 2014-10-12 MED ORDER — MIDAZOLAM HCL 2 MG/2ML IJ SOLN: 0.5000 mg | Freq: Once | INTRAMUSCULAR | Status: DC | PRN

## 2014-10-12 MED ORDER — NEOSTIGMINE METHYLSULFATE 10 MG/10ML IV SOLN
INTRAVENOUS | Status: DC | PRN
  Administered 2014-10-12: 5 mg via INTRAVENOUS

## 2014-10-12 MED ORDER — SODIUM CHLORIDE 0.9 % IV SOLN
INTRAVENOUS | Status: DC
  Administered 2014-10-12: 12:00:00 via INTRAVENOUS

## 2014-10-12 MED ORDER — LIDOCAINE HCL (CARDIAC) 20 MG/ML IV SOLN
INTRAVENOUS | Status: DC | PRN
Start: 2014-10-12 — End: 2014-10-12
  Administered 2014-10-12: 20 mg via INTRAVENOUS

## 2014-10-12 MED ORDER — PNEUMOCOCCAL VAC POLYVALENT 25 MCG/0.5ML IJ INJ
0.5000 mL | INJECTION | INTRAMUSCULAR | Status: AC
  Administered 2014-10-13: 0.5 mL via INTRAMUSCULAR
  Filled 2014-10-12: qty 0.5

## 2014-10-12 MED ORDER — GLYCOPYRROLATE 0.2 MG/ML IJ SOLN
INTRAMUSCULAR | Status: DC | PRN
  Administered 2014-10-12: 0.2 mg via INTRAVENOUS
  Administered 2014-10-12: 0.6 mg via INTRAVENOUS

## 2014-10-12 MED ORDER — VECURONIUM BROMIDE 10 MG IV SOLR
INTRAVENOUS | Status: DC | PRN
  Administered 2014-10-12 (×2): 2 mg via INTRAVENOUS

## 2014-10-12 MED ORDER — ASPIRIN 81 MG PO TABS: 81.0000 mg | ORAL_TABLET | Freq: Every day | ORAL | Status: DC

## 2014-10-12 MED ORDER — EPHEDRINE SULFATE 50 MG/ML IJ SOLN
INTRAMUSCULAR | Status: DC | PRN
  Administered 2014-10-12 (×3): 5 mg via INTRAVENOUS

## 2014-10-12 NOTE — Brief Op Note (Signed)
PREOP DX: RICA aneurysm  POSTOP DX: Same  PROCEDURE: Diagnostic cerebral angiogram  SURGEON: Dr. Consuella Lose, MD  ANESTHESIA: GETA  EBL: Minimal  SPECIMENS: None  COMPLICATIONS: None  CONDITION: Stable to recovery  FINDINGS: 1. Successful coil embolization of RICA aneurysm without significant residual aneurysm seen.

## 2014-10-12 NOTE — H&P (Signed)
CC:  Aneurysm  HPI: Denise Maynard is a 59 y.o. female previous incidental discovery of a right ICA aneurysm, confirmed on diagnostic angiogram. She now presents for elective coil embolization. She has no new complaints.  PMH: Past Medical History  Diagnosis Date  . Pressure in head 07/03/2014    PSH: Past Surgical History  Procedure Laterality Date  . Mouth surgery    . Fracture surgery      jaw    SH: History  Substance Use Topics  . Smoking status: Current Every Day Smoker -- 0.75 packs/day for 20 years    Types: Cigarettes  . Smokeless tobacco: Never Used  . Alcohol Use: No    MEDS: Prior to Admission medications   Medication Sig Start Date End Date Taking? Authorizing Provider  acetaminophen (TYLENOL) 500 MG tablet Take 500 mg by mouth every 6 (six) hours as needed for mild pain or moderate pain.   Yes Historical Provider, MD  aspirin 81 MG tablet Take 81 mg by mouth daily.   Yes Historical Provider, MD  Cholecalciferol (VITAMIN D) 2000 UNITS CAPS Take 2,000 Units by mouth daily.   Yes Historical Provider, MD  Multiple Vitamins-Minerals (MULTIVITAMIN WITH MINERALS) tablet Take 1 tablet by mouth daily.   Yes Historical Provider, MD  Omega 3 1200 MG CAPS Take 1,200 mg by mouth 2 (two) times daily.   Yes Historical Provider, MD    ALLERGY: No Known Allergies  ROS: ROS  NEUROLOGIC EXAM: Awake, alert, oriented Memory and concentration grossly intact Speech fluent, appropriate CN grossly intact Motor exam: Upper Extremities Deltoid Bicep Tricep Grip  Right 5/5 5/5 5/5 5/5  Left 5/5 5/5 5/5 5/5   Lower Extremity IP Quad PF DF EHL  Right 5/5 5/5 5/5 5/5 5/5  Left 5/5 5/5 5/5 5/5 5/5   Sensation grossly intact to LT  [facility]: Diagnostic angiogram demonstrates an ~4.73mm distal supraclinoid RICA aneurysm projecting laterally.  IMPRESSION: - 59 y.o. female with incidental supraclinoid RICA aneurysm  PLAN: - Proceed with primary coil embolization of RICA  aneurysm - ICU postop  The risks and benefits of the procedure were reviewed in detail with the patient and family in the office. All questions were answered.

## 2014-10-12 NOTE — Transfer of Care (Signed)
Immediate Anesthesia Transfer of Care Note  Patient: [name]  Procedure(s) Performed: Procedure(s): Embolization/arteriogram (N/A)  Patient Location: PACU  Anesthesia Type:General  Level of Consciousness: awake, oriented and patient cooperative  Airway & Oxygen Therapy: Patient Spontanous Breathing and Patient connected to nasal cannula oxygen  Post-op Assessment: Report given to RN and Post -op Vital signs reviewed and stable  Post vital signs: Reviewed  Last Vitals: There were no vitals filed for this visit.  Complications: No apparent anesthesia complications

## 2014-10-12 NOTE — Anesthesia Postprocedure Evaluation (Signed)
  Anesthesia Post-op Note  Patient: Denise Maynard  Procedure(s) Performed: Procedure(s): Embolization/arteriogram (N/A)  Patient Location: PACU  Anesthesia Type:General  Level of Consciousness: awake, alert , oriented and patient cooperative  Airway and Oxygen Therapy: Patient Spontanous Breathing and Patient connected to nasal cannula oxygen  Post-op Pain: none  Post-op Assessment: Post-op Vital signs reviewed, Patient's Cardiovascular Status Stable, Respiratory Function Stable, Patent Airway, No signs of Nausea or vomiting and Pain level controlled  Post-op Vital Signs: Reviewed and stable  Last Vitals:  Filed Vitals:   10/12/14 1155  BP: 109/58  Pulse:   Temp:   Resp:     Complications: No apparent anesthesia complications

## 2014-10-12 NOTE — Anesthesia Procedure Notes (Signed)
Procedure Name: Intubation Date/Time: 10/12/2014 8:23 AM Performed by: Jenne Campus Pre-anesthesia Checklist: Patient identified, Emergency Drugs available, Suction available, Patient being monitored and Timeout performed Patient Re-evaluated:Patient Re-evaluated prior to inductionOxygen Delivery Method: Circle system utilized Preoxygenation: Pre-oxygenation with 100% oxygen Intubation Type: IV induction Ventilation: Mask ventilation without difficulty Laryngoscope Size: Miller and 2 Grade View: Grade II Tube type: Oral Tube size: 7.0 mm Number of attempts: 1 Airway Equipment and Method: Stylet Placement Confirmation: ETT inserted through vocal cords under direct vision,  positive ETCO2,  CO2 detector and breath sounds checked- equal and bilateral Secured at: 21 cm Tube secured with: Tape Dental Injury: Teeth and Oropharynx as per pre-operative assessment

## 2014-10-12 NOTE — Anesthesia Preprocedure Evaluation (Addendum)
Anesthesia Evaluation  Patient identified by MRN, date of birth, ID band Patient awake    Reviewed: Allergy & Precautions, NPO status , Patient's Chart, lab work & pertinent test results  History of Anesthesia Complications Negative for: history of anesthetic complications  Airway Mallampati: II  TM Distance: >3 FB Neck ROM: Full    Dental  (+) Teeth Intact, Dental Advisory Given   Pulmonary Current Smoker,  breath sounds clear to auscultation        Cardiovascular - anginaRhythm:Regular Rate:Normal     Neuro/Psych  Headaches, Cerebral aneurysm    GI/Hepatic negative GI ROS, Neg liver ROS,   Endo/Other  negative endocrine ROS  Renal/GU negative Renal ROS     Musculoskeletal   Abdominal   Peds  Hematology negative hematology ROS (+)   Anesthesia Other Findings   Reproductive/Obstetrics                           Anesthesia Physical Anesthesia Plan  ASA: III  Anesthesia Plan: MAC and General   Post-op Pain Management:    Induction: Intravenous  Airway Management Planned: Nasal Cannula and Oral ETT  Additional Equipment: Arterial line  Intra-op Plan:   Post-operative Plan:   Informed Consent: I have reviewed the patients History and Physical, chart, labs and discussed the procedure including the risks, benefits and alternatives for the proposed anesthesia with the patient or authorized representative who has indicated his/her understanding and acceptance.   Dental advisory given  Plan Discussed with: CRNA and Surgeon  Anesthesia Plan Comments: (Plan routine monitors, A line, MAC for arteriogram, GETA if needed for embolization)        Anesthesia Quick Evaluation

## 2014-10-12 NOTE — Progress Notes (Signed)
Care of pt assumed by MA Brittan Mapel RN from J. Hart RN 

## 2014-10-12 NOTE — Progress Notes (Signed)
Pt seen in PACU, awake, alert, following commands. Moving all extremities symmetrically, no facial droop.

## 2014-10-12 NOTE — Progress Notes (Signed)
UR completed.  Massimiliano Rohleder, RN BSN [facility] CCM Trauma/Neuro ICU Case Manager [phone]  

## 2014-10-13 ENCOUNTER — Encounter (HOSPITAL_COMMUNITY): Payer: Self-pay | Admitting: Neurosurgery

## 2014-10-13 NOTE — Discharge Summary (Signed)
  Physician Discharge Summary  Patient ID: Denise Maynard MRN: [id number] DOB/AGE: March 05, 1956 59 y.o.  Admit date: 10/12/2014 Discharge date: 10/13/2014  Admission Diagnoses: Cerebral aneurysm  Discharge Diagnoses: Same Active Problems:   Cerebral aneurysm   Discharged Condition: Stable  Hospital Course:  Denise Maynard is a 59 y.o. female electively admitted after uncomplicated coil embolization of RICA aneurysm. She was at her baseline postop, and did well overnight. She was ambulating well, tolerating diet, with minimal pain.  Treatments: Surgery - Coil embolization of RICA aneurysm.  Discharge Exam: Blood pressure 101/49, pulse 52, temperature 99.8 F (37.7 C), temperature source Oral, resp. rate 16, height 5\' 5"  (1.651 m), SpO2 95 %. Awake, alert, oriented Speech fluent, appropriate CN grossly intact 5/5 BUE/BLE Wound c/d/i, soft  Follow-up: Follow-up in my office [facility] [phone]) in 3-4 weeks  Disposition: Home     Medication List    TAKE these medications        acetaminophen 500 MG tablet  Commonly known as:  TYLENOL  Take 500 mg by mouth every 6 (six) hours as needed for mild pain or moderate pain.     aspirin 81 MG tablet  Take 81 mg by mouth daily.     multivitamin with minerals tablet  Take 1 tablet by mouth daily.     Omega 3 1200 MG Caps  Take 1,200 mg by mouth 2 (two) times daily.     Vitamin D 2000 UNITS Caps  Take 2,000 Units by mouth daily.         SignedConsuella Lose, C 10/13/2014, 8:33 AM

## 2014-10-24 ENCOUNTER — Ambulatory Visit (HOSPITAL_COMMUNITY)
Admission: RE | Admit: 2014-10-24 | Discharge: 2014-10-24 | Disposition: A | Discharge status: Home / Self Care | Payer: 59 | Source: Ambulatory Visit | Attending: Physician Assistant | Admitting: Physician Assistant

## 2014-10-24 ENCOUNTER — Other Ambulatory Visit (HOSPITAL_COMMUNITY): Payer: Self-pay | Admitting: Physician Assistant

## 2014-10-24 DIAGNOSIS — M79671 Pain in right foot: Secondary | ICD-10-CM | POA: Diagnosis not present

## 2014-10-28 ENCOUNTER — Emergency Department (HOSPITAL_COMMUNITY)
Admission: EM | Admit: 2014-10-28 | Discharge: 2014-10-28 | Disposition: A | Discharge status: Home / Self Care | Payer: 59 | Attending: Emergency Medicine | Admitting: Emergency Medicine

## 2014-10-28 ENCOUNTER — Encounter (HOSPITAL_COMMUNITY): Payer: Self-pay | Admitting: *Deleted

## 2014-10-28 DIAGNOSIS — Z8679 Personal history of other diseases of the circulatory system: Secondary | ICD-10-CM | POA: Insufficient documentation

## 2014-10-28 DIAGNOSIS — Z79899 Other long term (current) drug therapy: Secondary | ICD-10-CM | POA: Insufficient documentation

## 2014-10-28 DIAGNOSIS — W1849XA Other slipping, tripping and stumbling without falling, initial encounter: Secondary | ICD-10-CM | POA: Diagnosis not present

## 2014-10-28 DIAGNOSIS — Y9289 Other specified places as the place of occurrence of the external cause: Secondary | ICD-10-CM | POA: Diagnosis not present

## 2014-10-28 DIAGNOSIS — S90821A Blister (nonthermal), right foot, initial encounter: Secondary | ICD-10-CM | POA: Diagnosis not present

## 2014-10-28 DIAGNOSIS — Z7982 Long term (current) use of aspirin: Secondary | ICD-10-CM | POA: Diagnosis not present

## 2014-10-28 DIAGNOSIS — S99921A Unspecified injury of right foot, initial encounter: Secondary | ICD-10-CM | POA: Diagnosis present

## 2014-10-28 DIAGNOSIS — Y9389 Activity, other specified: Secondary | ICD-10-CM | POA: Insufficient documentation

## 2014-10-28 DIAGNOSIS — Z720 Tobacco use: Secondary | ICD-10-CM | POA: Diagnosis not present

## 2014-10-28 DIAGNOSIS — Y998 Other external cause status: Secondary | ICD-10-CM | POA: Diagnosis not present

## 2014-10-28 DIAGNOSIS — L089 Local infection of the skin and subcutaneous tissue, unspecified: Secondary | ICD-10-CM

## 2014-10-28 LAB — CBC WITH DIFFERENTIAL/PLATELET
Basophils Absolute: 0.1 10*3/uL (ref 0.0–0.1)
Basophils Relative: 0 % (ref 0–1)
EOS ABS: 0.2 10*3/uL (ref 0.0–0.7)
EOS PCT: 2 % (ref 0–5)
HCT: 38.3 % (ref 36.0–46.0)
Hemoglobin: 13 g/dL (ref 12.0–15.0)
Lymphocytes Relative: 28 % (ref 12–46)
Lymphs Abs: 3.2 10*3/uL (ref 0.7–4.0)
MCH: 31.7 pg (ref 26.0–34.0)
MCHC: 33.9 g/dL (ref 30.0–36.0)
MCV: 93.4 fL (ref 78.0–100.0)
MONO ABS: 1.1 10*3/uL — AB (ref 0.1–1.0)
Monocytes Relative: 10 % (ref 3–12)
Neutro Abs: 7.1 10*3/uL (ref 1.7–7.7)
Neutrophils Relative %: 60 % (ref 43–77)
PLATELETS: 477 10*3/uL — AB (ref 150–400)
RBC: 4.1 MIL/uL (ref 3.87–5.11)
RDW: 14.6 % (ref 11.5–15.5)
WBC: 11.6 10*3/uL — ABNORMAL HIGH (ref 4.0–10.5)

## 2014-10-28 LAB — BASIC METABOLIC PANEL
Anion gap: 8 (ref 5–15)
BUN: 10 mg/dL (ref 6–23)
CHLORIDE: 103 mmol/L (ref 96–112)
CO2: 27 mmol/L (ref 19–32)
Calcium: 9.8 mg/dL (ref 8.4–10.5)
Creatinine, Ser: 0.75 mg/dL (ref 0.50–1.10)
GLUCOSE: 84 mg/dL (ref 70–99)
POTASSIUM: 4.1 mmol/L (ref 3.5–5.1)
Sodium: 138 mmol/L (ref 135–145)

## 2014-10-28 LAB — SEDIMENTATION RATE: SED RATE: 40 mm/h — AB (ref 0–22)

## 2014-10-28 MED ORDER — DOXYCYCLINE HYCLATE 100 MG PO CAPS: 100.0000 mg | ORAL_CAPSULE | Freq: Two times a day (BID) | ORAL | Status: DC

## 2014-10-28 MED ORDER — CEFAZOLIN SODIUM 1-5 GM-% IV SOLN
1.0000 g | Freq: Once | INTRAVENOUS | Status: AC
  Administered 2014-10-28: 1 g via INTRAVENOUS
  Filled 2014-10-28: qty 50

## 2014-10-28 NOTE — ED Notes (Signed)
The pt   Is c/o blisters on her rt foot and pain with swelling for  One week.  She  Tripped over a wire at that time but there were no marks on her foot  Initially there were no marks then the blisters appeared..  She has been seen by her regular doctor that xrayed it and she was given meds.  Today there are more blisters and she has more pain and swelling.  She was here as a pt march 3rd for a coiling of a cerebral aneurysm

## 2014-10-28 NOTE — ED Notes (Signed)
Vascular tech at bedside. °

## 2014-10-28 NOTE — Discharge Instructions (Signed)
Clean your foot daily.  Place antibiotic ointment and a dressing to any other areas of infection that rupture.  Check with your physician within the next week if not improving.

## 2014-10-28 NOTE — ED Provider Notes (Signed)
CSN: [id number]     Arrival date & time 10/28/14  1655 History   First MD Initiated Contact with Patient 10/28/14 1744     Chief Complaint  Patient presents with  . Foot Pain      HPI  Social history evaluation of abnormal skin of her right foot. Has some discomfort in the foot. Has noticed small areas. Be somewhat fluid filled or blisterlike. It is painful when she walks. Less so when she simply stands. Does not had fever chills and does not feel poorly.  Areas appear to be somewhat yellow or purulent but not ruptured or drained yet. Along the upper aspect of her foot and laterally onto the left fifth toe dorsally and laterally. States the foot may be "slightly" swollen. No red streaks up her leg.  She had a right femoral artery approach to a cerebral angiogram and aneurysm coil deployment 2 weeks ago for an asymptomatic we will aneurysm. Has recovered well from this. No swelling or pain at the groin.  Past Medical History  Diagnosis Date  . Pressure in head 07/03/2014  . Aneurysm    Past Surgical History  Procedure Laterality Date  . Mouth surgery    . Fracture surgery      jaw  . Radiology with anesthesia N/A 10/12/2014    Procedure: Embolization/arteriogram;  Surgeon: Consuella Lose, MD;  Location: [facility];  Service: Radiology;  Laterality: N/A;   Family History  Problem Relation Age of Onset  . Heart attack Mother   . Diabetes Mother   . Cancer Father     throat   . Hypertension Father   . Hyperlipidemia Sister   . Diabetes Maternal Grandmother   . Cancer Maternal Grandfather   . Heart attack Paternal Grandfather   . Hyperlipidemia Sister    History  Substance Use Topics  . Smoking status: Current Every Day Smoker -- 0.75 packs/day for 20 years    Types: Cigarettes  . Smokeless tobacco: Never Used  . Alcohol Use: No   OB History    Gravida Para Term Preterm AB TAB SAB Ectopic Multiple Living   1 1        1      Review of Systems  Constitutional: Negative for  fever, chills, diaphoresis, appetite change and fatigue.  HENT: Negative for mouth sores, sore throat and trouble swallowing.   Eyes: Negative for visual disturbance.  Respiratory: Negative for cough, chest tightness, shortness of breath and wheezing.   Cardiovascular: Negative for chest pain.  Gastrointestinal: Negative for nausea, vomiting, abdominal pain, diarrhea and abdominal distention.  Endocrine: Negative for polydipsia, polyphagia and polyuria.  Genitourinary: Negative for dysuria, frequency and hematuria.  Musculoskeletal: Negative for gait problem.  Skin: Negative for color change, pallor and rash.  Neurological: Negative for dizziness, syncope, light-headedness and headaches.  Hematological: Does not bruise/bleed easily.  Psychiatric/Behavioral: Negative for behavioral problems and confusion.      Allergies  Review of patient's allergies indicates no known allergies.  Home Medications   Prior to Admission medications   Medication Sig Start Date End Date Taking? Authorizing Provider  acetaminophen (TYLENOL) 500 MG tablet Take 500 mg by mouth every 6 (six) hours as needed for mild pain or moderate pain.    Historical Provider, MD  aspirin 81 MG tablet Take 81 mg by mouth daily.    Historical Provider, MD  Cholecalciferol (VITAMIN D) 2000 UNITS CAPS Take 2,000 Units by mouth daily.    Historical Provider, MD  doxycycline (VIBRAMYCIN)  100 MG capsule Take 1 capsule (100 mg total) by mouth 2 (two) times daily. 10/28/14   Tanna Furry, MD  Multiple Vitamins-Minerals (MULTIVITAMIN WITH MINERALS) tablet Take 1 tablet by mouth daily.    Historical Provider, MD  Omega 3 1200 MG CAPS Take 1,200 mg by mouth 2 (two) times daily.    Historical Provider, MD   BP 117/51 mmHg  Pulse 67  Temp(Src) 99.2 F (37.3 C) (Oral)  Resp 18  SpO2 97% Physical Exam  Constitutional: She is oriented to person, place, and time. She appears well-developed and well-nourished. No distress.  HENT:  Head:  Normocephalic.  Eyes: Conjunctivae are normal. Pupils are equal, round, and reactive to light. No scleral icterus.  Neck: Normal range of motion. Neck supple. No thyromegaly present.  Cardiovascular: Normal rate and regular rhythm.  Exam reveals no gallop and no friction rub.   No murmur heard. Pulmonary/Chest: Effort normal and breath sounds normal. No respiratory distress. She has no wheezes. She has no rales.  Abdominal: Soft. Bowel sounds are normal. She exhibits no distension. There is no tenderness. There is no rebound.  Musculoskeletal: Normal range of motion.  Neurological: She is alert and oriented to person, place, and time.  Skin: Skin is warm and dry. No rash noted.  Sole of the right foot shows several areas of small areas of pustules. Somewhat clear fluid filled. Some appear purulent. Not frankly vesicular or petechial.  Psychiatric: She has a normal mood and affect. Her behavior is normal.    ED Course  Procedures (including critical care time) Labs Review Labs Reviewed  CBC WITH DIFFERENTIAL/PLATELET - Abnormal; Notable for the following:    WBC 11.6 (*)    Platelets 477 (*)    Monocytes Absolute 1.1 (*)    All other components within normal limits  SEDIMENTATION RATE - Abnormal; Notable for the following:    Sed Rate 40 (*)    All other components within normal limits  CULTURE, BLOOD (SINGLE)  BASIC METABOLIC PANEL  C-REACTIVE PROTEIN    Imaging Review No results found.   EKG Interpretation None      MDM   Final diagnoses:  Blister of foot, infected, right, initial encounter    Dear erratically this may be multiple areas of infection. No sign of this being an endocarditis. She does not have murmur. She does not have any other areas of infection or inflammation in her eyes upper extremities remainder of her skin or left lower extremity. This may be simple localized infection. Given IV Ancef. Blood culture obtained. Plan is discharge home. Dr. Loyola Mast.  Primary care follow-up. Recheck any worsening symptoms.    Tanna Furry, MD 10/28/14 2055

## 2014-10-28 NOTE — Progress Notes (Signed)
VASCULAR LAB PRELIMINARY  PRELIMINARY  PRELIMINARY  PRELIMINARY  Right ultrasound of the groin to rule out pseudoaneurysm completed.    Preliminary report:  There is no pseudoaneurysm or AV fistula noted in the right groin.  There is an enlarged lymph node noted in the right groin. Arterial flow is normal throughout the right lower extremity.  Tamora Huneke, RVT 10/28/2014, 7:00 PM

## 2014-10-28 NOTE — ED Notes (Signed)
Vascular Tech aware of patient

## 2014-10-29 LAB — C-REACTIVE PROTEIN: CRP: 0.8 mg/dL — ABNORMAL HIGH (ref <0.60)

## 2014-11-04 LAB — CULTURE, BLOOD (SINGLE): Culture: NO GROWTH

## 2014-11-07 ENCOUNTER — Encounter: Payer: Self-pay | Admitting: Vascular Surgery

## 2014-11-07 ENCOUNTER — Telehealth: Payer: Self-pay | Admitting: Surgery

## 2014-11-07 NOTE — Telephone Encounter (Signed)
Received a return call from [place] confirming appt, dpm

## 2014-11-07 NOTE — Telephone Encounter (Signed)
-----   Message from Denman George, RN sent at 11/06/2014  5:34 PM EDT ----- Regarding: needs MD consult within 2 days Per Dr. Trula Slade, pt. needs office consult within next 1-2 days with anyone.  S/p Coil Embolization of (R) ICA Aneurysm 10/12/14 per Dr. Kathyrn Sheriff, and has developed changes to her (R) foot that may indicate she embolized to her (R) foot, following procedure. Per Dr. Trula Slade- no vasc. Studies as these were done at the ER recently.

## 2014-11-07 NOTE — Telephone Encounter (Signed)
Left message for patient with appt date/time- asked that she call back to confirm and/or reschedule. dpm

## 2014-11-08 ENCOUNTER — Encounter: Payer: Self-pay | Admitting: Vascular Surgery

## 2014-11-08 ENCOUNTER — Ambulatory Visit (INDEPENDENT_AMBULATORY_CARE_PROVIDER_SITE_OTHER): Payer: 59 | Admitting: Vascular Surgery

## 2014-11-08 ENCOUNTER — Ambulatory Visit (HOSPITAL_COMMUNITY)
Admission: RE | Admit: 2014-11-08 | Discharge: 2014-11-08 | Disposition: A | Discharge status: Home / Self Care | Payer: 59 | Source: Ambulatory Visit | Attending: Vascular Surgery | Admitting: Vascular Surgery

## 2014-11-08 VITALS — BP 130/63 | HR 63 | Resp 16 | Ht 65.0 in | Wt 135.0 lb

## 2014-11-08 DIAGNOSIS — I700 Atherosclerosis of aorta: Secondary | ICD-10-CM | POA: Diagnosis not present

## 2014-11-08 DIAGNOSIS — R1900 Intra-abdominal and pelvic swelling, mass and lump, unspecified site: Secondary | ICD-10-CM | POA: Diagnosis not present

## 2014-11-08 DIAGNOSIS — I75021 Atheroembolism of right lower extremity: Secondary | ICD-10-CM | POA: Diagnosis not present

## 2014-11-08 NOTE — Progress Notes (Addendum)
[facility] HISTORY AND PHYSICAL   History of Present Illness:  Patient is a 59 y.o. year old female who presents for evaluation of nonhealing wounds right foot.  The patient had a carotid artery aneurysm embolization performed by Dr. Kathyrn Sheriff with neurosurgery on March 3. She did well with the procedure. However, 3 days post procedure she started to notice a "pulling sensation" in her right foot. She was seen by her primary care physician and given NSAIDs. The pain in the foot became worse and she began to develop blisters on the foot. She was seen in the emergency room on March 19. She was given a prescription for antibiotics. She subsequently saw her primary M.D. again on March 29 and was given a prescription for antifungal medication. She was then seen by her neuro interventionalist earlier this week and switched to Bactrim. The patient states that several of the blisters ruptured and she developed ulcerations scattered on her right foot. She states that the swelling has improved. She states she thinks most of the ulcers are starting to heal. She denies any fever or chills. She states the ulcers were draining but this has improved. She denies any prior similar events. She denies any prior events in the left leg. She denies rest pain or claudication. She is a smoker of many years of three quarters pack per day. Greater than 3 minutes today spent regarding smoking cessation counseling. The patient denies any prior history and her family of aneurysms. She really has no other significant medical problems.   Past Medical History  Diagnosis Date  . Pressure in head 07/03/2014  . Aneurysm     Past Surgical History  Procedure Laterality Date  . Mouth surgery    . Fracture surgery      jaw  . Radiology with anesthesia N/A 10/12/2014    Procedure: Embolization/arteriogram;  Surgeon: Consuella Lose, MD;  Location: [facility];  Service: Radiology;  Laterality: N/A;    Social  History History  Substance Use Topics  . Smoking status: Current Every Day Smoker -- 0.75 packs/day for 20 years    Types: Cigarettes  . Smokeless tobacco: Never Used  . Alcohol Use: No    Family History Family History  Problem Relation Age of Onset  . Heart attack Mother   . Diabetes Mother   . Cancer Father     throat   . Hypertension Father   . Hyperlipidemia Sister   . Diabetes Maternal Grandmother   . Cancer Maternal Grandfather   . Heart attack Paternal Grandfather   . Hyperlipidemia Sister     Allergies  No Known Allergies   Current Outpatient Prescriptions  Medication Sig Dispense Refill  . acetaminophen (TYLENOL) 500 MG tablet Take 500 mg by mouth every 6 (six) hours as needed for mild pain or moderate pain.    Marland Kitchen aspirin 81 MG tablet Take 81 mg by mouth daily.    . Cholecalciferol (VITAMIN D) 2000 UNITS CAPS Take 2,000 Units by mouth daily.    . Multiple Vitamins-Minerals (MULTIVITAMIN WITH MINERALS) tablet Take 1 tablet by mouth daily.    . Omega 3 1200 MG CAPS Take 1,200 mg by mouth 2 (two) times daily.    Marland Kitchen doxycycline (VIBRAMYCIN) 100 MG capsule Take 1 capsule (100 mg total) by mouth 2 (two) times daily. (Patient not taking: Reported on 11/08/2014) 29 capsule 0   No current facility-administered medications for this visit.   Bactrim  ROS:   General:  No weight loss, Fever, chills  HEENT: No recent headaches, no nasal bleeding, no visual changes, no sore throat  Neurologic: No dizziness, blackouts, seizures. No recent symptoms of stroke or mini- stroke. No recent episodes of slurred speech, or temporary blindness.  Cardiac: No recent episodes of chest pain/pressure, no shortness of breath at rest.  No shortness of breath with exertion.  Denies history of atrial fibrillation or irregular heartbeat  Vascular: No history of rest pain in feet.  No history of claudication.  No history of non-healing ulcer, No history of DVT   Pulmonary: No home oxygen, no  productive cough, no hemoptysis,  No asthma or wheezing  Musculoskeletal:  [ ]  Arthritis, [ ]  Low back pain,  [ ]  Joint pain  Hematologic:No history of hypercoagulable state.  No history of easy bleeding.  No history of anemia  Gastrointestinal: No hematochezia or melena,  No gastroesophageal reflux, no trouble swallowing  Urinary: [ ]  chronic Kidney disease, [ ]  on HD - [ ]  MWF or [ ]  TTHS, [ ]  Burning with urination, [ ]  Frequent urination, [ ]  Difficulty urinating;   Skin: No rashes  Psychological: No history of anxiety,  No history of depression   Physical Examination  Filed Vitals:   11/08/14 1444  BP: 130/63  Pulse: 63  Resp: 16  Height: 5\' 5"  (1.651 m)  Weight: 135 lb (61.236 kg)    Body mass index is 22.47 kg/(m^2).  General:  Alert and oriented, no acute distress HEENT: Normal Neck: No bruit or JVD Pulmonary: Clear to auscultation bilaterally Cardiac: Regular Rate and Rhythm without murmur Abdomen: Soft, non-tender, non-distended,.full and wide feeling abdominal aorta  Skin: No rash, multiple ulcerations scattered about right foot the deepest of which is on the lateral aspect of her right foot over the mid shaft of the right fifth metatarsal which is 3-4 cm in length 2 cm in width covered in fibrinous exudate no surrounding erythema there are multiple dark spots ranging from 2-4 mm in diameter on the plantar aspect of her right foot. There is some sloughed skin of the toes on the right foot these appear to be healing. Extremity Pulses:  2+ radial, brachial, femoral, dorsalis pedis pulses bilaterally Musculoskeletal: No deformity or edema  Neurologic: Upper and lower extremity motor 5/5 and symmetric  DATA:  Patient had an arterial duplex scan performed at [facility] on 10/28/2014 which showed no evidence of pseudoaneurysm.  She had an abdominal aortic ultrasound in our office today which showed no evidence of common iliac or abdominal aortic aneurysm. There  was a diffuse calcific plaque observed throughout the aorta.   ASSESSMENT:  Most likely atheroembolic event to right lower extremity. She does have palpable pulses in the right foot and should have adequate perfusion for wound healing at this point.   PLAN:  I believe the patient would benefit from abdominal aortogram bilateral lower extremity runoff with possible intervention to make sure that she does not have ulcerated plaque that could still put her at risk for further events. She will continue her aspirin. If no significant lesion is found on arteriogram then treatment will primarily be aspirin with consideration for adding Plavix to aspirin. She will finish her Bactrim prescription. She will continue her current aspirin. Risks benefits possible complications and procedure details of arteriogram were explained to the patient. She understands and agrees to proceed. This is scheduled for April 15. She will call us if her foot deteriorates prior to this and we  would consider moving this up. Relationship of cigarette smoking and atherosclerosis was explained to the patient. She was counseled to quit smoking.  Ruta Hinds, MD [facility] Office: [phone] Pager: [phone]

## 2014-11-09 ENCOUNTER — Other Ambulatory Visit: Payer: Self-pay

## 2014-11-12 ENCOUNTER — Encounter (HOSPITAL_COMMUNITY): Payer: Self-pay | Admitting: Cardiology

## 2014-11-12 ENCOUNTER — Emergency Department (HOSPITAL_COMMUNITY)
Admission: EM | Admit: 2014-11-12 | Discharge: 2014-11-12 | Disposition: A | Discharge status: Home / Self Care | Payer: 59 | Attending: Emergency Medicine | Admitting: Emergency Medicine

## 2014-11-12 ENCOUNTER — Emergency Department (HOSPITAL_COMMUNITY): Payer: 59

## 2014-11-12 DIAGNOSIS — Z8679 Personal history of other diseases of the circulatory system: Secondary | ICD-10-CM | POA: Insufficient documentation

## 2014-11-12 DIAGNOSIS — Z7982 Long term (current) use of aspirin: Secondary | ICD-10-CM | POA: Insufficient documentation

## 2014-11-12 DIAGNOSIS — Z79899 Other long term (current) drug therapy: Secondary | ICD-10-CM | POA: Diagnosis not present

## 2014-11-12 DIAGNOSIS — Z792 Long term (current) use of antibiotics: Secondary | ICD-10-CM | POA: Insufficient documentation

## 2014-11-12 DIAGNOSIS — Z8673 Personal history of transient ischemic attack (TIA), and cerebral infarction without residual deficits: Secondary | ICD-10-CM | POA: Insufficient documentation

## 2014-11-12 DIAGNOSIS — R079 Chest pain, unspecified: Secondary | ICD-10-CM

## 2014-11-12 DIAGNOSIS — Z720 Tobacco use: Secondary | ICD-10-CM | POA: Diagnosis not present

## 2014-11-12 HISTORY — DX: Cerebral infarction, unspecified: I63.9

## 2014-11-12 LAB — BASIC METABOLIC PANEL
ANION GAP: 9 (ref 5–15)
BUN: 12 mg/dL (ref 6–23)
CALCIUM: 9.5 mg/dL (ref 8.4–10.5)
CHLORIDE: 101 mmol/L (ref 96–112)
CO2: 23 mmol/L (ref 19–32)
CREATININE: 0.96 mg/dL (ref 0.50–1.10)
GFR calc Af Amer: 73 mL/min — ABNORMAL LOW (ref >90)
GFR calc non Af Amer: 63 mL/min — ABNORMAL LOW (ref >90)
GLUCOSE: 92 mg/dL (ref 70–99)
Potassium: 4.3 mmol/L (ref 3.5–5.1)
Sodium: 133 mmol/L — ABNORMAL LOW (ref 135–145)

## 2014-11-12 LAB — CBC WITH DIFFERENTIAL/PLATELET
BASOS PCT: 1 % (ref 0–1)
Basophils Absolute: 0 10*3/uL (ref 0.0–0.1)
Eosinophils Absolute: 0.1 10*3/uL (ref 0.0–0.7)
Eosinophils Relative: 2 % (ref 0–5)
HCT: 40.6 % (ref 36.0–46.0)
HEMOGLOBIN: 14.2 g/dL (ref 12.0–15.0)
LYMPHS PCT: 19 % (ref 12–46)
Lymphs Abs: 1.6 10*3/uL (ref 0.7–4.0)
MCH: 31.8 pg (ref 26.0–34.0)
MCHC: 35 g/dL (ref 30.0–36.0)
MCV: 91 fL (ref 78.0–100.0)
Monocytes Absolute: 1 10*3/uL (ref 0.1–1.0)
Monocytes Relative: 12 % (ref 3–12)
Neutro Abs: 5.4 10*3/uL (ref 1.7–7.7)
Neutrophils Relative %: 66 % (ref 43–77)
Platelets: 481 10*3/uL — ABNORMAL HIGH (ref 150–400)
RBC: 4.46 MIL/uL (ref 3.87–5.11)
RDW: 14.6 % (ref 11.5–15.5)
WBC: 8.1 10*3/uL (ref 4.0–10.5)

## 2014-11-12 LAB — TROPONIN I: Troponin I: <0.03 ng/mL (ref <0.031)

## 2014-11-12 MED ORDER — ALPRAZOLAM 0.5 MG PO TABS
0.5000 mg | ORAL_TABLET | Freq: Once | ORAL | Status: AC
  Administered 2014-11-12: 0.5 mg via ORAL
  Filled 2014-11-12: qty 1

## 2014-11-12 MED ORDER — ALPRAZOLAM 0.5 MG PO TABS: 0.5000 mg | ORAL_TABLET | Freq: Every day | ORAL | Status: DC

## 2014-11-12 NOTE — Discharge Instructions (Signed)
Test today were good. Recommend following up with cardiology.  Return here if worse

## 2014-11-12 NOTE — ED Notes (Signed)
Chest pain,  Feels like her heart is beating fast.  Feels shaky.

## 2014-11-12 NOTE — ED Provider Notes (Signed)
CSN: [id number]     Arrival date & time 11/12/14  1048 History  This chart was scribed for Nat Christen, MD by [facility], ED Scribe. The patient was seen in [facility] and the patient's care was started at 11:49 AM.  Chief Complaint  Patient presents with  . Chest Pain   Patient is a 59 y.o. female presenting with chest pain. The history is provided by the patient and a relative. No language interpreter was used.  Chest Pain Associated symptoms: no diaphoresis, no nausea and no shortness of breath     HPI Comments: RANETTE LUCKADOO is a 59 y.o. female who presents to the Emergency Department complaining of left lateral chest pain gradual onset yesterday. She describes the pain today as a pressure when breathing. Pt states movement did not worsen the pain. She has a FHx of heart attack, her mother had a heart attack in her late 85 early 13. She ha a CABG Pt smokes about .75 pack a day. She has never had a formal cardiology work up. This morning 1 hour pta she began shaking but this has improved. Her sister states she has several stressors in her life which she believes could be contributing factors to her shaking. March 3rd she had a coil to repair a brain aneurysm, after the procedure she began developing blisters in her right foot. She soaked her foot in Clorox for relief and she is taking Septra DS. The onset of the blisters was 5 days after the procedure and the blisters have gradually improved since. She is a Glass blower/designer, she works 1st shift. Pt denies SOB, diaphoresis, and nausea.  Past Medical History  Diagnosis Date  . Pressure in head 07/03/2014  . Aneurysm   . Stroke     neurologist said she had a stroke in her foot   Past Surgical History  Procedure Laterality Date  . Mouth surgery    . Fracture surgery      jaw  . Radiology with anesthesia N/A 10/12/2014    Procedure: Embolization/arteriogram;  Surgeon: Consuella Lose, MD;  Location: [facility];  Service: Radiology;   Laterality: N/A;   Family History  Problem Relation Age of Onset  . Heart attack Mother   . Diabetes Mother   . Cancer Father     throat   . Hypertension Father   . Hyperlipidemia Sister   . Diabetes Maternal Grandmother   . Cancer Maternal Grandfather   . Heart attack Paternal Grandfather   . Hyperlipidemia Sister    History  Substance Use Topics  . Smoking status: Current Every Day Smoker -- 0.75 packs/day for 20 years    Types: Cigarettes  . Smokeless tobacco: Never Used  . Alcohol Use: No   OB History    Gravida Para Term Preterm AB TAB SAB Ectopic Multiple Living   1 1        1      Review of Systems  Constitutional: Negative for diaphoresis.  Respiratory: Negative for shortness of breath.   Cardiovascular: Positive for chest pain.  Gastrointestinal: Negative for nausea.   A complete 10 system review of systems was obtained and all systems are negative except as noted in the HPI and PMH.   Allergies  Review of patient's allergies indicates no known allergies.  Home Medications   Prior to Admission medications   Medication Sig Start Date End Date Taking? Authorizing Provider  aspirin EC 81 MG tablet Take 81 mg by mouth daily.  Yes Historical Provider, MD  Cholecalciferol (VITAMIN D) 2000 UNITS CAPS Take 2,000 Units by mouth daily.   Yes Historical Provider, MD  Multiple Vitamins-Minerals (MULTIVITAMIN WITH MINERALS) tablet Take 1 tablet by mouth daily.   Yes Historical Provider, MD  Omega 3 1200 MG CAPS Take 1,200 mg by mouth 2 (two) times daily.   Yes Historical Provider, MD  sulfamethoxazole-trimethoprim (BACTRIM DS,SEPTRA DS) 800-160 MG per tablet Take 2 tablets by mouth every 12 (twelve) hours. Started on 11/06/14 for 10 days 11/06/14  Yes Historical Provider, MD  acetaminophen (TYLENOL) 500 MG tablet Take 500 mg by mouth every 6 (six) hours as needed for mild pain or moderate pain.    Historical Provider, MD  doxycycline (VIBRAMYCIN) 100 MG capsule Take 1  capsule (100 mg total) by mouth 2 (two) times daily. Patient not taking: Reported on 11/08/2014 10/28/14   Tanna Furry, MD   BP 109/61 mmHg  Pulse 50  Temp(Src) 97.9 F (36.6 C) (Oral)  Resp 18  Ht 5\' 5"  (1.651 m)  Wt 135 lb (61.236 kg)  BMI 22.47 kg/m2  SpO2 99% Physical Exam  Constitutional: She is oriented to person, place, and time. She appears well-developed and well-nourished.  HENT:  Head: Normocephalic and atraumatic.  Eyes: Conjunctivae and EOM are normal. Pupils are equal, round, and reactive to light.  Neck: Normal range of motion. Neck supple.  Cardiovascular: Normal rate and regular rhythm.   Pulmonary/Chest: Effort normal and breath sounds normal.  Tender over the left lateral chest.  Abdominal: Soft. Bowel sounds are normal.  Musculoskeletal: Normal range of motion.  Neurological: She is alert and oriented to person, place, and time.  Skin: Skin is warm and dry.  Psychiatric: She has a normal mood and affect. Her behavior is normal.  Nursing note and vitals reviewed.  ED Course  Procedures  DIAGNOSTIC STUDIES: Oxygen Saturation is 98% on room air, normal by my interpretation.    COORDINATION OF CARE: 11:55 AM Advise th pt use an OTC anti-fungal cream and neosporin for the healing blisters in her right foot. Will do a chest x-ray, basic blood work and EKG. pt agreed to plan.  Labs Review Labs Reviewed  CBC WITH DIFFERENTIAL/PLATELET - Abnormal; Notable for the following:    Platelets 481 (*)    All other components within normal limits  BASIC METABOLIC PANEL - Abnormal; Notable for the following:    Sodium 133 (*)    GFR calc non Af Amer 63 (*)    GFR calc Af Amer 73 (*)    All other components within normal limits  TROPONIN I   Imaging Review Dg Chest 2 View  11/12/2014   CLINICAL DATA:  Left-sided chest pain episodically, smoker  EXAM: CHEST  2 VIEW  COMPARISON:  None.  FINDINGS: Hyperinflation suggests emphysema. Biapical pleural thickening is noted.  Heart size normal. No pleural effusion. Bones are subjectively osteopenic without focal acute finding. Mild rightward curvature of the thoracic spine centered at T8 noted. No focal lobar opacity.  IMPRESSION: Hyper expansion suggesting emphysema.  No focal acute finding.   Electronically Signed   By: Conchita Paris M.D.   On: 11/12/2014 12:00     EKG Interpretation   Date/Time:  Sunday November 12 2014 10:56:23 EDT Ventricular Rate:  59 PR Interval:  128 QRS Duration: 88 QT Interval:  394 QTC Calculation: 390 R Axis:   60 Text Interpretation:  Sinus bradycardia Nonspecific ST abnormality  Abnormal ECG Confirmed by Lacinda Axon  MD, Voyd Groft (  [id number]) on 11/12/2014 11:08:15 AM      MDM   Final diagnoses:  Chest pain, unspecified chest pain type   Patient is stable. Screening labs, EKG, troponin, chest x-ray all negative for acute findings. Discussed with family members including patient. Will follow-up with cardiologist. Discharge medication Xanax 0.5 mg  I personally performed the services described in this documentation, which was scribed in my presence. The recorded information has been reviewed and is accurate.      Nat Christen, MD 11/12/14 1414

## 2014-11-23 MED ORDER — SODIUM CHLORIDE 0.9 % IV SOLN
INTRAVENOUS | Status: DC
  Administered 2014-11-24: 06:00:00 via INTRAVENOUS

## 2014-11-24 ENCOUNTER — Encounter (HOSPITAL_COMMUNITY)
Admission: RE | Disposition: A | Discharge status: Home / Self Care | Payer: Self-pay | Source: Ambulatory Visit | Attending: Vascular Surgery

## 2014-11-24 ENCOUNTER — Encounter (HOSPITAL_COMMUNITY): Payer: Self-pay | Admitting: Vascular Surgery

## 2014-11-24 ENCOUNTER — Ambulatory Visit (HOSPITAL_COMMUNITY)
Admission: RE | Admit: 2014-11-24 | Discharge: 2014-11-24 | Disposition: A | Discharge status: Home / Self Care | Payer: 59 | Source: Ambulatory Visit | Attending: Vascular Surgery | Admitting: Vascular Surgery

## 2014-11-24 DIAGNOSIS — L97519 Non-pressure chronic ulcer of other part of right foot with unspecified severity: Secondary | ICD-10-CM | POA: Diagnosis present

## 2014-11-24 DIAGNOSIS — F17210 Nicotine dependence, cigarettes, uncomplicated: Secondary | ICD-10-CM | POA: Insufficient documentation

## 2014-11-24 DIAGNOSIS — Z7982 Long term (current) use of aspirin: Secondary | ICD-10-CM | POA: Diagnosis not present

## 2014-11-24 DIAGNOSIS — I75021 Atheroembolism of right lower extremity: Secondary | ICD-10-CM | POA: Diagnosis not present

## 2014-11-24 HISTORY — PX: ABDOMINAL AORTAGRAM: SHX5454

## 2014-11-24 HISTORY — PX: LOWER EXTREMITY ANGIOGRAM: SHX5508

## 2014-11-24 SURGERY — ABDOMINAL AORTAGRAM
Anesthesia: LOCAL

## 2014-11-24 MED ORDER — LIDOCAINE HCL (PF) 1 % IJ SOLN
INTRAMUSCULAR | Status: AC
  Filled 2014-11-24: qty 30

## 2014-11-24 MED ORDER — ALUM & MAG HYDROXIDE-SIMETH 200-200-20 MG/5ML PO SUSP: 15.0000 mL | ORAL | Status: DC | PRN

## 2014-11-24 MED ORDER — SODIUM CHLORIDE 0.45 % IV SOLN
INTRAVENOUS | Status: DC
  Administered 2014-11-24: 1000 mL via INTRAVENOUS

## 2014-11-24 MED ORDER — ACETAMINOPHEN 325 MG PO TABS: 325.0000 mg | ORAL_TABLET | ORAL | Status: DC | PRN

## 2014-11-24 MED ORDER — HEPARIN (PORCINE) IN NACL 2-0.9 UNIT/ML-% IJ SOLN
INTRAMUSCULAR | Status: AC
  Filled 2014-11-24: qty 1000

## 2014-11-24 MED ORDER — PHENOL 1.4 % MT LIQD: 1.0000 | OROMUCOSAL | Status: DC | PRN

## 2014-11-24 MED ORDER — PANTOPRAZOLE SODIUM 40 MG PO TBEC: 40.0000 mg | DELAYED_RELEASE_TABLET | Freq: Every day | ORAL | Status: DC

## 2014-11-24 MED ORDER — MORPHINE SULFATE 10 MG/ML IJ SOLN: 2.0000 mg | INTRAMUSCULAR | Status: DC | PRN

## 2014-11-24 MED ORDER — POTASSIUM CHLORIDE CRYS ER 20 MEQ PO TBCR: 20.0000 meq | EXTENDED_RELEASE_TABLET | Freq: Every day | ORAL | Status: DC | PRN

## 2014-11-24 MED ORDER — DOCUSATE SODIUM 100 MG PO CAPS: 100.0000 mg | ORAL_CAPSULE | Freq: Every day | ORAL | Status: DC

## 2014-11-24 MED ORDER — LABETALOL HCL 5 MG/ML IV SOLN: 10.0000 mg | INTRAVENOUS | Status: DC | PRN

## 2014-11-24 MED ORDER — ONDANSETRON HCL 4 MG/2ML IJ SOLN: 4.0000 mg | Freq: Four times a day (QID) | INTRAMUSCULAR | Status: DC | PRN

## 2014-11-24 MED ORDER — HYDRALAZINE HCL 20 MG/ML IJ SOLN: 5.0000 mg | INTRAMUSCULAR | Status: DC | PRN

## 2014-11-24 MED ORDER — ACETAMINOPHEN 325 MG RE SUPP: 325.0000 mg | RECTAL | Status: DC | PRN

## 2014-11-24 MED ORDER — METOPROLOL TARTRATE 1 MG/ML IV SOLN: 2.0000 mg | INTRAVENOUS | Status: DC | PRN

## 2014-11-24 MED ORDER — MAGNESIUM SULFATE 2 GM/50ML IV SOLN: 2.0000 g | Freq: Every day | INTRAVENOUS | Status: DC | PRN

## 2014-11-24 MED ORDER — DEXTROSE 5 % IV SOLN: 1.5000 g | Freq: Two times a day (BID) | INTRAVENOUS | Status: DC

## 2014-11-24 MED ORDER — GUAIFENESIN-DM 100-10 MG/5ML PO SYRP: 15.0000 mL | ORAL_SOLUTION | ORAL | Status: DC | PRN

## 2014-11-24 NOTE — Discharge Instructions (Signed)
Angiogram °An angiogram, also called angiography, is a procedure used to look at the blood vessels that carry blood to different parts of your body (arteries). In this procedure, dye is injected through a long, thin tube (catheter) into an artery. X-rays are then taken. The X-rays will show if there is a blockage or problem in a blood vessel.  °LET YOUR HEALTH CARE PROVIDER KNOW ABOUT: °· Any allergies you have, including allergies to shellfish or contrast dye.   °· All medicines you are taking, including vitamins, herbs, eye drops, creams, and over-the-counter medicines.   °· Previous problems you or members of your family have had with the use of anesthetics.   °· Any blood disorders you have.   °· Previous surgeries you have had. °· Any previous kidney problems or failure you have had.  °· Medical conditions you have.   °· Possibility of pregnancy, if this applies. °RISKS AND COMPLICATIONS °Generally, an angiogram is a safe procedure. However, as with any procedure, problems can occur. Possible problems include: °· Injury to the blood vessels, including rupture or bleeding. °· Infection or bruising at the catheter site. °· Allergic reaction to the dye or contrast used. °· Kidney damage from the dye or contrast used. °· Blood clots that can lead to a stroke or heart attack. °BEFORE THE PROCEDURE °· Do not eat or drink after midnight on the night before the procedure, or as directed by your health care provider.   °· Ask your health care provider if you may drink enough water to take any needed medicines the morning of the procedure.   °PROCEDURE °· You may be given a medicine to help you relax (sedative) before and during the procedure. This medicine is given through an IV access tube that is inserted into one of your veins.   °· The area where the catheter will be inserted will be washed and shaved. This is usually done in the groin but may be done in the fold of your arm (near your elbow) or in the wrist. °· A  medicine will be given to numb the area where the catheter will be inserted (local anesthetic). °· The catheter will be inserted with a guide wire into an artery. The catheter is guided by using a type of X-ray (fluoroscopy) to the blood vessel being examined.   °· Dye is then injected into the catheter, and X-rays are taken. The dye helps to show where any narrowing or blockages are located.   °AFTER THE PROCEDURE  °· If the procedure is done through the leg, you will be kept in bed lying flat for several hours. You will be instructed to not bend or cross your legs. °· The insertion site will be checked frequently. °· The pulse in your feet or wrist will be checked frequently. °· Additional blood tests, X-rays, and electrocardiography may be done.   °· You may need to stay in the hospital overnight for observation.   °Document Released: 05/07/2005 Document Revised: 08/02/2013 Document Reviewed: 12/29/2012 °ExitCare® Patient Information ©2015 [employer]. This information is not intended to replace advice given to you by your health care provider. Make sure you discuss any questions you have with your health care provider. ° °

## 2014-11-24 NOTE — Progress Notes (Signed)
Site area: Left FA Site Prior to Removal:  Level 0 Pressure Applied For:71min Manual: yes   Patient Status During Pull:  stable Post Pull Site:  Level 0 Post Pull Instructions Given:  yes Post Pull Pulses Present: palpable Dressing Applied:  clear Bedrest begins @ 0900 Comments:removed by [employer]

## 2014-11-24 NOTE — H&P (View-Only) (Signed)
[facility] [place] HISTORY AND PHYSICAL   History of Present Illness:  Patient is a 59 y.o. year old female who presents for evaluation of nonhealing wounds right foot.  The patient had a carotid artery aneurysm embolization performed by Dr. Kathyrn Sheriff with neurosurgery on March 3. She did well with the procedure. However, 3 days post procedure she started to notice a "pulling sensation" in her right foot. She was seen by her primary care physician and given NSAIDs. The pain in the foot became worse and she began to develop blisters on the foot. She was seen in the emergency room on March 19. She was given a prescription for antibiotics. She subsequently saw her primary M.D. again on March 29 and was given a prescription for antifungal medication. She was then seen by her neuro interventionalist earlier this week and switched to Bactrim. The patient states that several of the blisters ruptured and she developed ulcerations scattered on her right foot. She states that the swelling has improved. She states she thinks most of the ulcers are starting to heal. She denies any fever or chills. She states the ulcers were draining but this has improved. She denies any prior similar events. She denies any prior events in the left leg. She denies rest pain or claudication. She is a smoker of many years of three quarters pack per day. Greater than 3 minutes today spent regarding smoking cessation counseling. The patient denies any prior history and her family of aneurysms. She really has no other significant medical problems.   Past Medical History  Diagnosis Date  . Pressure in head 07/03/2014  . Aneurysm     Past Surgical History  Procedure Laterality Date  . Mouth surgery    . Fracture surgery      jaw  . Radiology with anesthesia N/A 10/12/2014    Procedure: Embolization/arteriogram;  Surgeon: Consuella Lose, MD;  Location: [place];  Service: Radiology;  Laterality: N/A;    Social  History History  Substance Use Topics  . Smoking status: Current Every Day Smoker -- 0.75 packs/day for 20 years    Types: Cigarettes  . Smokeless tobacco: Never Used  . Alcohol Use: No    Family History Family History  Problem Relation Age of Onset  . Heart attack Mother   . Diabetes Mother   . Cancer Father     throat   . Hypertension Father   . Hyperlipidemia Sister   . Diabetes Maternal Grandmother   . Cancer Maternal Grandfather   . Heart attack Paternal Grandfather   . Hyperlipidemia Sister     Allergies  No Known Allergies   Current Outpatient Prescriptions  Medication Sig Dispense Refill  . acetaminophen (TYLENOL) 500 MG tablet Take 500 mg by mouth every 6 (six) hours as needed for mild pain or moderate pain.    Marland Kitchen aspirin 81 MG tablet Take 81 mg by mouth daily.    . Cholecalciferol (VITAMIN D) 2000 UNITS CAPS Take 2,000 Units by mouth daily.    . Multiple Vitamins-Minerals (MULTIVITAMIN WITH MINERALS) tablet Take 1 tablet by mouth daily.    . Omega 3 1200 MG CAPS Take 1,200 mg by mouth 2 (two) times daily.    Marland Kitchen doxycycline (VIBRAMYCIN) 100 MG capsule Take 1 capsule (100 mg total) by mouth 2 (two) times daily. (Patient not taking: Reported on 11/08/2014) 29 capsule 0   No current facility-administered medications for this visit.   Bactrim  ROS:   General:  No weight loss, Fever, chills  HEENT: No recent headaches, no nasal bleeding, no visual changes, no sore throat  Neurologic: No dizziness, blackouts, seizures. No recent symptoms of stroke or mini- stroke. No recent episodes of slurred speech, or temporary blindness.  Cardiac: No recent episodes of chest pain/pressure, no shortness of breath at rest.  No shortness of breath with exertion.  Denies history of atrial fibrillation or irregular heartbeat  Vascular: No history of rest pain in feet.  No history of claudication.  No history of non-healing ulcer, No history of DVT   Pulmonary: No home oxygen, no  productive cough, no hemoptysis,  No asthma or wheezing  Musculoskeletal:  [ ]  Arthritis, [ ]  Low back pain,  [ ]  Joint pain  Hematologic:No history of hypercoagulable state.  No history of easy bleeding.  No history of anemia  Gastrointestinal: No hematochezia or melena,  No gastroesophageal reflux, no trouble swallowing  Urinary: [ ]  chronic Kidney disease, [ ]  on HD - [ ]  MWF or [ ]  TTHS, [ ]  Burning with urination, [ ]  Frequent urination, [ ]  Difficulty urinating;   Skin: No rashes  Psychological: No history of anxiety,  No history of depression   Physical Examination  Filed Vitals:   11/08/14 1444  BP: 130/63  Pulse: 63  Resp: 16  Height: 5\' 5"  (1.651 m)  Weight: 135 lb (61.236 kg)    Body mass index is 22.47 kg/(m^2).  General:  Alert and oriented, no acute distress HEENT: Normal Neck: No bruit or JVD Pulmonary: Clear to auscultation bilaterally Cardiac: Regular Rate and Rhythm without murmur Abdomen: Soft, non-tender, non-distended,.full and wide feeling abdominal aorta  Skin: No rash, multiple ulcerations scattered about right foot the deepest of which is on the lateral aspect of her right foot over the mid shaft of the right fifth metatarsal which is 3-4 cm in length 2 cm in width covered in fibrinous exudate no surrounding erythema there are multiple dark spots ranging from 2-4 mm in diameter on the plantar aspect of her right foot. There is some sloughed skin of the toes on the right foot these appear to be healing. Extremity Pulses:  2+ radial, brachial, femoral, dorsalis pedis pulses bilaterally Musculoskeletal: No deformity or edema  Neurologic: Upper and lower extremity motor 5/5 and symmetric  DATA:  Patient had an arterial duplex scan performed at [facility] on 10/28/2014 which showed no evidence of pseudoaneurysm.  She had an abdominal aortic ultrasound in our office today which showed no evidence of common iliac or abdominal aortic aneurysm. There  was a diffuse calcific plaque observed throughout the aorta.   ASSESSMENT:  Most likely atheroembolic event to right lower extremity. She does have palpable pulses in the right foot and should have adequate perfusion for wound healing at this point.   PLAN:  I believe the patient would benefit from abdominal aortogram bilateral lower extremity runoff with possible intervention to make sure that she does not have ulcerated plaque that could still put her at risk for further events. She will continue her aspirin. If no significant lesion is found on arteriogram then treatment will primarily be aspirin with consideration for adding Plavix to aspirin. She will finish her Bactrim prescription. She will continue her current aspirin. Risks benefits possible complications and procedure details of arteriogram were explained to the patient. She understands and agrees to proceed. This is scheduled for April 15. She will call us if her foot deteriorates prior to this and we  would consider moving this up. Relationship of cigarette smoking and atherosclerosis was explained to the patient. She was counseled to quit smoking.  Ruta Hinds, MD [facility] [place] Office: [phone] Pager: [phone]

## 2014-11-24 NOTE — Op Note (Signed)
Procedure: Thoracic and abdominal aortogram with bilateral lower extremity runoff  Preoperative diagnosis: Atheroemboli right foot  Postoperative diagnosis: Same  Anesthesia: Local  Indications: Patient is a 59 year old female who recently had coil embolization of a an intracranial aneurysm. Post procedure she developed blistering and ulcers on her right foot consistent with an atheroembolic event. She now presents for elective arteriograms further investigation of an embolic source.  Operative details: After pain informed consent, the patient was taken to the [facility] lab. The patient was placed in supine position Angio table. Both groins were prepped and draped the usual sterile fashion. Local anesthesia was demonstrated of the left common femoral artery. Ultrasound was used to identify the left common femoral artery. An introducer needle was then used to cannulate the left common femoral artery without difficulty. An 035 versacore wire was threaded up in the abdominal aorta under fluoroscopic guidance. A 5 French sheathover the guidewire and the left common femoral artery and this was thoroughly flushed with heparinized saline. A 5 French Omni Flush catheter was then placed over the guidewire and advanced into the upper portion of the abdominal aorta. An abdominal aortogram was then obtained in AP projection. This shows no ulcerated plaque. The arterial wall is smooth with no irregularity. The infrarenal abdominal aorta is widely patent. The left and right renal arteries are widely patent. Next the Omni Flush catheter was pulled down just above the aortic bifurcation. An AP and bilateral oblique views of the pelvis were performed. This shows a patent infrarenal abdominal aorta with bilateral patent common external and internal iliac arteries with no evidence of ulceration or significant plaque burden. The Omni Flush catheter was then used to perform a bilateral lower extremity runoff.  In the left lower  extremity, the left common femoral profunda femoris and superficial femoral arteries are widely patent. The left popliteal artery is widely patent. The origins of all 3 tibial vessels are well visualized although contrast the leads out in the lower portion of the leg.  In the right lower extremity, the right common femoral profunda femoris and superficial femoral arteries are all patent. The anterior tibial posterior tibial and peroneal arteries are all widely patent. There is no evidence of ulcerated plaque or possible source of atheroemboli.  Since at this point we had found no real obvious source for an atheroembolic event the Omni Flush catheter was exchanged over a guidewire for a long 5 French pigtail catheter. This was advanced up into the ascending aorta. An aortogram was then obtained in a 45 LAO position. This shows a normal ascending and descending thoracic aorta with no evidence of ulcerated plaque. The innominate left common and left subclavian origins are all patent with normal arch configuration. At this point the pigtail catheter was pulled back over a guidewire. The 5 French sheath was thoroughly flushed with heparin saline. Sheath was left in place to be pulled in the holding area. The patient tolerated the procedure well and there were no complications.  Operative management: No obvious atheroembolic source found on arteriogram today. Patient will continue her aspirin daily. She will follow-up with me on an as-needed basis.  Ruta Hinds, MD [facility] [place] Office: [phone] Pager: [phone]

## 2014-11-24 NOTE — Interval H&P Note (Signed)
History and Physical Interval Note:  11/24/2014 7:33 AM  Denise Maynard  has presented today for surgery, with the diagnosis of pvd with right foot ulcer  The various methods of treatment have been discussed with the patient and family. After consideration of risks, benefits and other options for treatment, the patient has consented to  Procedure(s): ABDOMINAL AORTAGRAM (N/A) as a surgical intervention .  The patient's history has been reviewed, patient examined, no change in status, stable for surgery.  I have reviewed the patient's chart and labs.  Questions were answered to the patient's satisfaction.     Keyshla Tunison E

## 2014-11-27 ENCOUNTER — Telehealth: Payer: Self-pay | Admitting: Vascular Surgery

## 2014-11-27 NOTE — Telephone Encounter (Signed)
-----   Message from Mena Goes, RN sent at 11/24/2014  9:44 AM EDT ----- Regarding: Schedule FYI no follow up  ----- Message -----    From: Elam Dutch, MD    Sent: 11/24/2014   8:36 AM      To: [facility] Charge Pool  Aortogram with bilateral runoff [place] left groin  She does not need follow up  Ruta Hinds

## 2014-11-29 ENCOUNTER — Ambulatory Visit: Payer: 59 | Admitting: Infectious Diseases

## 2014-12-05 ENCOUNTER — Ambulatory Visit: Payer: 59 | Admitting: Cardiovascular Disease

## 2014-12-28 ENCOUNTER — Ambulatory Visit (INDEPENDENT_AMBULATORY_CARE_PROVIDER_SITE_OTHER): Payer: 59

## 2014-12-28 ENCOUNTER — Ambulatory Visit (INDEPENDENT_AMBULATORY_CARE_PROVIDER_SITE_OTHER): Payer: 59 | Admitting: Cardiology

## 2014-12-28 ENCOUNTER — Encounter: Payer: Self-pay | Admitting: Cardiology

## 2014-12-28 VITALS — BP 124/66 | HR 65 | Ht 65.0 in | Wt 134.0 lb

## 2014-12-28 DIAGNOSIS — I749 Embolism and thrombosis of unspecified artery: Secondary | ICD-10-CM | POA: Diagnosis not present

## 2014-12-28 DIAGNOSIS — E785 Hyperlipidemia, unspecified: Secondary | ICD-10-CM | POA: Diagnosis not present

## 2014-12-28 DIAGNOSIS — R002 Palpitations: Secondary | ICD-10-CM | POA: Diagnosis not present

## 2014-12-28 NOTE — Patient Instructions (Signed)
Your physician recommends that you schedule a follow-up appointment in: TBD after results  Your physician has requested that you have an echocardiogram. Echocardiography is a painless test that uses sound waves to create images of your heart. It provides your doctor with information about the size and shape of your heart and how well your heart's chambers and valves are working. This procedure takes approximately one hour. There are no restrictions for this procedure.  Your physician has recommended that you wear an event monitor. Event monitors are medical devices that record the heart's electrical activity. Doctors most often [place] these monitors to diagnose arrhythmias. Arrhythmias are problems with the speed or rhythm of the heartbeat. The monitor is a small, portable device. You can wear one while you do your normal daily activities. This is usually used to diagnose what is causing palpitations/syncope (passing out).  Your physician recommends that you continue on your current medications as directed. Please refer to the Current Medication list given to you today.  Thank you for choosing [facility]!

## 2014-12-28 NOTE — Progress Notes (Signed)
Cardiology Office Note  Date: 12/28/2014   ID: Denise Maynard, DOB 25-Feb-1956, MRN [id number]  PCP: Purvis Kilts, MD  Consulting Cardiologist: Rozann Lesches, MD   Chief Complaint  Patient presents with  . Possible peripheral embolic event    History of Present Illness: Denise Maynard is a 59 y.o. female referred for cardiology consultation by Dr. Hilma Favors. I reviewed her history. She was diagnosed with a right internal carotid artery aneurysm and underwent coil embolization with Dr. Kathyrn Sheriff in April. Following this she presented to see Dr. Oneida Alar due to subsequent develop of a discomfort in her right foot followed by development of blisters and ulcerations that were somewhat slow to heal. There was suspicion that she may have had an atheroembolic event to the right lower extremity and underwent thoracic and abdominal aortogram with bilateral lower extremity runoff with Dr. Oneida Alar in mid April. This study reported no obvious atheroembolic source and it was recommended that aspirin be continued with no additional workup planned.  During this time course she was also seen in the ER with chest discomfort and what she describes to me as a sense of palpitations. She was evaluated by Dr. Lacinda Axon. Cardiac enzymes were normal, ECG showed sinus rhythm. She tells me that she has experienced brief palpitations for at least the last year, never particularly thought very much of them. She has had no major limitation related to the symptoms. She does not have any history of cardiac arrhythmia.  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 She has a history of tobacco abuse.    Past Medical History  Diagnosis Date  . Carotid artery aneurysm     Coil embolization of a supraclinoid right internal carotid artery - Dr. Kathyrn Sheriff  . Hyperlipidemia     Past Surgical History  Procedure Laterality Date  . Mouth surgery    . Fracture surgery      Jaw  . Radiology with anesthesia  N/A 10/12/2014    Procedure: Embolization/arteriogram;  Surgeon: Consuella Lose, MD;  Location: [place];  Service: Radiology;  Laterality: N/A;  . Abdominal aortagram N/A 11/24/2014    Procedure: ABDOMINAL AORTAGRAM;  Surgeon: Elam Dutch, MD;  Location: [facility] CATH LAB;  Service: Cardiovascular;  Laterality: N/A;  . Lower extremity angiogram  11/24/2014    Procedure: LOWER EXTREMITY ANGIOGRAM;  Surgeon: Elam Dutch, MD;  Location: [facility] CATH LAB;  Service: Cardiovascular;;    Current Outpatient Prescriptions  Medication Sig Dispense Refill  . acetaminophen (TYLENOL) 500 MG tablet Take 1,000 mg by mouth every 6 (six) hours as needed for mild pain or moderate pain.     Marland Kitchen ALPRAZolam (XANAX) 0.5 MG tablet Take 1 tablet (0.5 mg total) by mouth at bedtime. (Patient taking differently: Take 0.25 mg by mouth at bedtime as needed for anxiety. ) 15 tablet 0  . aspirin EC 81 MG tablet Take 81 mg by mouth daily.    . cholecalciferol (VITAMIN D) 1000 UNITS tablet Take 1,000 Units by mouth daily.    . Multiple Vitamins-Minerals (MULTIVITAMIN WITH MINERALS) tablet Take 1 tablet by mouth daily.    . Omega-3 Fatty Acids (FISH OIL) 1200 MG CAPS Take 1,200 mg by mouth 2 (two) times daily.    Marland Kitchen OVER THE COUNTER MEDICATION Apply 1 application topically 2 (two) times daily as needed (wound care). CVS anti-fungal medication/neosporin/CVS Cortisone Maximum - mixed together     No current facility-administered medications for this visit.  Allergies:  Review of patient's allergies indicates no known allergies.   Social History: The patient  reports that she has been smoking Cigarettes.  She has a 15 pack-year smoking history. She has never used smokeless tobacco. She reports that she does not drink alcohol or use illicit drugs.   Family History: The patient's family history includes Cancer in her maternal grandfather; Diabetes in her maternal grandmother and mother; Heart attack in her mother and paternal  grandfather; Hyperlipidemia in her sister and sister; Hypertension in her father; Throat cancer in her father.   ROS:  Please see the history of present illness. Otherwise, complete review of systems is positive for none.  All other systems are reviewed and negative.   Physical Exam: VS:  BP 124/66 mmHg  Pulse 65  Ht 5\' 5"  (1.651 m)  Wt 134 lb (60.782 kg)  BMI 22.30 kg/m2  SpO2 96%, BMI Body mass index is 22.3 kg/(m^2).  Wt Readings from Last 3 Encounters:  12/28/14 134 lb (60.782 kg)  11/12/14 135 lb (61.236 kg)  11/08/14 135 lb (61.236 kg)     General: Patient appears comfortable at rest. HEENT: Conjunctiva and lids normal, oropharynx clear. Neck: Supple, no elevated JVP or carotid bruits, no thyromegaly. Lungs: Clear to auscultation, nonlabored breathing at rest. Cardiac: Regular rate and rhythm, no S3 or significant systolic murmur, no pericardial rub. Abdomen: Soft, nontender, bowel sounds present, no guarding or rebound. Extremities: No pitting edema, DPs 2+. Skin: Warm and dry. Musculoskeletal: No kyphosis. Neuropsychiatric: Alert and oriented x3, affect grossly appropriate.   ECG: ECG from April of this year reviewed showing sinus bradycardia.  Recent Labwork: 07/03/2014: ALT 13; AST 18; TSH 1.375 11/12/2014: BUN 12; Creatinine 0.96; Hemoglobin 14.2; Platelets 481*; Potassium 4.3; Sodium 133*     Component Value Date/Time   CHOL 229* 07/03/2014 1030   TRIG 113 07/03/2014 1030   HDL 75 07/03/2014 1030   CHOLHDL 3.1 07/03/2014 1030   VLDL 23 07/03/2014 1030   LDLCALC 131* 07/03/2014 1030    ASSESSMENT AND PLAN:  1. Patient with questionable peripheral embolic event based on clinical presentation, not demonstrated by distal angiography however. She has no history of cardiac arrhythmia although has experienced some palpitations. Our plan is to obtain an echocardiogram to assess cardiac structure and function and also an event recorder to exclude any obvious atrial  arrhythmias that might increase her risk for thromboembolic events. For now she will continue aspirin.  2. Reported history of mild hyperlipidemia, managed by diet and omega-3 supplements.  Current medicines were reviewed at length with the patient today.   Orders Placed This Encounter  Procedures  . Echocardiogram    Disposition: Call with results.   Signed, Satira Sark, MD, [facility] 12/28/2014 3:42 PM    [facility] at [facility] [address] [place], [place] [address] Phone: [phone]; Fax: [phone]

## 2014-12-28 NOTE — Addendum Note (Signed)
Addended by: Levonne Hubert on: 12/28/2014 05:08 PM   Modules accepted: Orders

## 2014-12-29 ENCOUNTER — Other Ambulatory Visit: Payer: Self-pay | Admitting: Cardiology

## 2014-12-29 DIAGNOSIS — R002 Palpitations: Secondary | ICD-10-CM

## 2015-01-03 ENCOUNTER — Ambulatory Visit (HOSPITAL_COMMUNITY)
Admission: RE | Admit: 2015-01-03 | Discharge: 2015-01-03 | Disposition: A | Discharge status: Home / Self Care | Payer: 59 | Source: Ambulatory Visit | Attending: Cardiovascular Disease | Admitting: Cardiovascular Disease

## 2015-01-03 DIAGNOSIS — I749 Embolism and thrombosis of unspecified artery: Secondary | ICD-10-CM | POA: Insufficient documentation

## 2015-01-04 ENCOUNTER — Telehealth: Payer: Self-pay | Admitting: Cardiology

## 2015-01-04 NOTE — Telephone Encounter (Signed)
Patient given echo results,copy to pcp

## 2015-01-04 NOTE — Telephone Encounter (Signed)
Pls call Dosha Broshears concerning test results

## 2015-01-05 ENCOUNTER — Other Ambulatory Visit (HOSPITAL_COMMUNITY): Payer: 59

## 2015-04-26 ENCOUNTER — Telehealth: Payer: Self-pay

## 2015-04-26 NOTE — Telephone Encounter (Signed)
Calling to set up TCS. She would like to have it on Monday. Her call back number is [phone]

## 2015-04-30 NOTE — Telephone Encounter (Signed)
LMOM for a return call.  Pt is on recall for 03/2019. Her last colonoscopy was 03/19/2009 by Dr. Gala Romney.

## 2015-04-30 NOTE — Telephone Encounter (Signed)
Pt returned call. ( Her last colonoscopy was 03/19/2009 by Dr. Oneida Alar ( NOT DR. Gala Romney) She said she thought she was to return in 5 years due to polyps. I told her Dr. Oneida Alar has her down to come back in 10 years ( 2020). She is not having any problems and no family hx of colon cancer. Please advise!

## 2015-05-01 ENCOUNTER — Other Ambulatory Visit (HOSPITAL_COMMUNITY): Payer: Self-pay | Admitting: Neurosurgery

## 2015-05-01 ENCOUNTER — Other Ambulatory Visit: Payer: Self-pay | Admitting: Neurosurgery

## 2015-05-01 DIAGNOSIS — I729 Aneurysm of unspecified site: Secondary | ICD-10-CM

## 2015-05-10 NOTE — Telephone Encounter (Signed)
PLEASE CALL PT. I REVIEWED HER TCS FROM 2010. SHE HAD ONE SIMPLE ADENOMA AND ONE HYPERPLASTIC POLYP REMOVED. THE SCREENING GUIDELINES CALL FOR A TCS ANYTIME BETWEEN 2015 AND 2020 BECAUSE SHE HAD ONE SIMPLE ADENOMA REMOVED. SHE COULD HAVE ONE IN 2016 PER OUR SCREENING GUIDELINES.  I FAVOR 2016 BECAUSE HER SISTER HAD A HISTORY OF COLON POLYPS.

## 2015-05-15 ENCOUNTER — Other Ambulatory Visit (HOSPITAL_COMMUNITY)
Admission: RE | Admit: 2015-05-15 | Discharge: 2015-05-15 | Disposition: A | Discharge status: Home / Self Care | Payer: 59 | Source: Ambulatory Visit | Attending: Adult Health | Admitting: Adult Health

## 2015-05-15 ENCOUNTER — Ambulatory Visit (INDEPENDENT_AMBULATORY_CARE_PROVIDER_SITE_OTHER): Payer: 59 | Admitting: Adult Health

## 2015-05-15 ENCOUNTER — Encounter: Payer: Self-pay | Admitting: Adult Health

## 2015-05-15 VITALS — BP 136/76 | HR 64 | Ht 65.0 in | Wt 136.5 lb

## 2015-05-15 DIAGNOSIS — Z01419 Encounter for gynecological examination (general) (routine) without abnormal findings: Secondary | ICD-10-CM | POA: Diagnosis not present

## 2015-05-15 DIAGNOSIS — F17210 Nicotine dependence, cigarettes, uncomplicated: Secondary | ICD-10-CM

## 2015-05-15 DIAGNOSIS — Z1151 Encounter for screening for human papillomavirus (HPV): Secondary | ICD-10-CM | POA: Insufficient documentation

## 2015-05-15 DIAGNOSIS — Z1212 Encounter for screening for malignant neoplasm of rectum: Secondary | ICD-10-CM

## 2015-05-15 DIAGNOSIS — F17200 Nicotine dependence, unspecified, uncomplicated: Secondary | ICD-10-CM

## 2015-05-15 HISTORY — DX: Nicotine dependence, unspecified, uncomplicated: F17.200

## 2015-05-15 LAB — HEMOCCULT GUIAC POC 1CARD (OFFICE): Fecal Occult Blood, POC: NEGATIVE

## 2015-05-15 NOTE — Progress Notes (Signed)
Patient ID: Denise Maynard, female   DOB: 20-Apr-1956, 59 y.o.   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 PCP is [facility].   Current Medications, Allergies, Past Medical History, Past Surgical History, Family History and Social History were reviewed in [employer] record.     Review of Systems: Patient denies any headaches, hearing loss, fatigue, blurred vision, shortness of breath, chest pain, abdominal pain, problems with bowel movements, urination, or intercourse. No joint pain or mood swings.She says she wants the patch to quit smoking, but declines referral to [facility].    Physical Exam:BP 136/76 mmHg  Pulse 64  Ht 5\' 5"  (1.651 m)  Wt 136 lb 8 oz (61.916 kg)  BMI 22.71 kg/m2 General:  Well developed, well nourished, no acute distress Skin:  Warm and dry Neck:  Midline trachea, normal thyroid, good ROM, no lymphadenopathy, no carotid bruits heard Lungs; Clear to auscultation bilaterally Breast:  No dominant palpable mass, retraction, or nipple discharge Cardiovascular: Regular rate and rhythm Abdomen:  Soft, non tender, no hepatosplenomegaly Pelvic:  External genitalia is normal in appearance, no lesions.  The vagina is pale and has loss of moisture and rugae. Urethra has no lesions or masses. The cervix is smooth, pap with HPV performed.  Uterus is felt to be normal size, shape, and contour.  No adnexal masses or tenderness noted.Bladder is non tender, no masses felt. Rectal: Good sphincter tone, no polyps, or hemorrhoids felt.  Hemoccult negative. Extremities/musculoskeletal:  No swelling or varicosities noted, no clubbing or cyanosis Psych:  No mood changes, alert and cooperative,seems  happy   Impression: Well woman gyn exam and pap Nicotine adiction    Plan: [facility] Check CBC,CMP,TSH and lipids Gets flu [facility] Mammogram yearly Colonoscopy per GI Follow up with neurologist

## 2015-05-15 NOTE — Patient Instructions (Signed)
Physical in 1 year Mammogram yearly Colonoscopy per GI 

## 2015-05-16 ENCOUNTER — Telehealth: Payer: Self-pay | Admitting: Adult Health

## 2015-05-16 LAB — CBC
Hematocrit: 44.7 % (ref 34.0–46.6)
Hemoglobin: 15.1 g/dL (ref 11.1–15.9)
MCH: 31.5 pg (ref 26.6–33.0)
MCHC: 33.8 g/dL (ref 31.5–35.7)
MCV: 93 fL (ref 79–97)
Platelets: 542 10*3/uL — ABNORMAL HIGH (ref 150–379)
RBC: 4.8 x10E6/uL (ref 3.77–5.28)
RDW: 15.1 % (ref 12.3–15.4)
WBC: 11.2 10*3/uL — AB (ref 3.4–10.8)

## 2015-05-16 LAB — COMPREHENSIVE METABOLIC PANEL
ALT: 16 IU/L (ref 0–32)
AST: 22 IU/L (ref 0–40)
Albumin/Globulin Ratio: 1.7 (ref 1.1–2.5)
Albumin: 4.8 g/dL (ref 3.6–4.8)
Alkaline Phosphatase: 91 IU/L (ref 39–117)
BUN/Creatinine Ratio: 14 (ref 11–26)
BUN: 9 mg/dL (ref 8–27)
Bilirubin Total: 0.4 mg/dL (ref 0.0–1.2)
CALCIUM: 10.2 mg/dL (ref 8.7–10.3)
CO2: 22 mmol/L (ref 18–29)
CREATININE: 0.64 mg/dL (ref 0.57–1.00)
Chloride: 102 mmol/L (ref 97–108)
GFR calc Af Amer: 111 mL/min/{1.73_m2} (ref >59)
GFR calc non Af Amer: 97 mL/min/{1.73_m2} (ref >59)
GLUCOSE: 82 mg/dL (ref 65–99)
Globulin, Total: 2.9 g/dL (ref 1.5–4.5)
Potassium: 5.2 mmol/L (ref 3.5–5.2)
SODIUM: 143 mmol/L (ref 134–144)
Total Protein: 7.7 g/dL (ref 6.0–8.5)

## 2015-05-16 LAB — LIPID PANEL
CHOLESTEROL TOTAL: 246 mg/dL — AB (ref 100–199)
Chol/HDL Ratio: 3.2 ratio units (ref 0.0–4.4)
HDL: 78 mg/dL (ref >39)
LDL Calculated: 148 mg/dL — ABNORMAL HIGH (ref 0–99)
TRIGLYCERIDES: 101 mg/dL (ref 0–149)
VLDL CHOLESTEROL CAL: 20 mg/dL (ref 5–40)

## 2015-05-16 LAB — TSH: TSH: 1.68 u[IU]/mL (ref 0.450–4.500)

## 2015-05-16 NOTE — Telephone Encounter (Signed)
Left message about labs

## 2015-05-17 ENCOUNTER — Other Ambulatory Visit (HOSPITAL_COMMUNITY): Payer: Self-pay | Admitting: Neurosurgery

## 2015-05-17 ENCOUNTER — Ambulatory Visit (HOSPITAL_COMMUNITY)
Admission: RE | Admit: 2015-05-17 | Discharge: 2015-05-17 | Disposition: A | Discharge status: Home / Self Care | Payer: 59 | Source: Ambulatory Visit | Attending: Neurosurgery | Admitting: Neurosurgery

## 2015-05-17 DIAGNOSIS — I729 Aneurysm of unspecified site: Secondary | ICD-10-CM

## 2015-05-17 DIAGNOSIS — E785 Hyperlipidemia, unspecified: Secondary | ICD-10-CM | POA: Diagnosis not present

## 2015-05-17 DIAGNOSIS — Z48812 Encounter for surgical aftercare following surgery on the circulatory system: Secondary | ICD-10-CM | POA: Insufficient documentation

## 2015-05-17 DIAGNOSIS — I671 Cerebral aneurysm, nonruptured: Secondary | ICD-10-CM | POA: Diagnosis not present

## 2015-05-17 DIAGNOSIS — F17210 Nicotine dependence, cigarettes, uncomplicated: Secondary | ICD-10-CM | POA: Insufficient documentation

## 2015-05-17 DIAGNOSIS — Z7982 Long term (current) use of aspirin: Secondary | ICD-10-CM | POA: Diagnosis not present

## 2015-05-17 LAB — BASIC METABOLIC PANEL
Anion gap: 7 (ref 5–15)
BUN: 8 mg/dL (ref 6–20)
CHLORIDE: 105 mmol/L (ref 101–111)
CO2: 28 mmol/L (ref 22–32)
CREATININE: 0.63 mg/dL (ref 0.44–1.00)
Calcium: 9.4 mg/dL (ref 8.9–10.3)
GFR calc non Af Amer: >60 mL/min (ref >60)
Glucose, Bld: 80 mg/dL (ref 65–99)
Potassium: 3.8 mmol/L (ref 3.5–5.1)
Sodium: 140 mmol/L (ref 135–145)

## 2015-05-17 LAB — CBC WITH DIFFERENTIAL/PLATELET
Basophils Absolute: 0 10*3/uL (ref 0.0–0.1)
Basophils Relative: 0 %
Eosinophils Absolute: 0.2 10*3/uL (ref 0.0–0.7)
Eosinophils Relative: 2 %
HEMATOCRIT: 44.1 % (ref 36.0–46.0)
HEMOGLOBIN: 14.8 g/dL (ref 12.0–15.0)
LYMPHS ABS: 2 10*3/uL (ref 0.7–4.0)
LYMPHS PCT: 25 %
MCH: 31.2 pg (ref 26.0–34.0)
MCHC: 33.6 g/dL (ref 30.0–36.0)
MCV: 93 fL (ref 78.0–100.0)
Monocytes Absolute: 0.8 10*3/uL (ref 0.1–1.0)
Monocytes Relative: 10 %
NEUTROS PCT: 63 %
Neutro Abs: 5.2 10*3/uL (ref 1.7–7.7)
Platelets: 457 10*3/uL — ABNORMAL HIGH (ref 150–400)
RBC: 4.74 MIL/uL (ref 3.87–5.11)
RDW: 15 % (ref 11.5–15.5)
WBC: 8.2 10*3/uL (ref 4.0–10.5)

## 2015-05-17 LAB — PROTIME-INR
INR: 0.94 (ref 0.00–1.49)
Prothrombin Time: 12.8 seconds (ref 11.6–15.2)

## 2015-05-17 LAB — APTT: aPTT: 32 seconds (ref 24–37)

## 2015-05-17 LAB — CYTOLOGY - PAP

## 2015-05-17 MED ORDER — LIDOCAINE HCL 1 % IJ SOLN
INTRAMUSCULAR | Status: AC
Start: 2015-05-17 — End: 2015-05-17
  Filled 2015-05-17: qty 20

## 2015-05-17 MED ORDER — MIDAZOLAM HCL 2 MG/2ML IJ SOLN
INTRAMUSCULAR | Status: AC
  Filled 2015-05-17: qty 2

## 2015-05-17 MED ORDER — IOHEXOL 300 MG/ML  SOLN
100.0000 mL | Freq: Once | INTRAMUSCULAR | Status: DC | PRN
  Administered 2015-05-17: 50 mL via INTRAVENOUS
  Filled 2015-05-17: qty 100

## 2015-05-17 MED ORDER — FENTANYL CITRATE (PF) 100 MCG/2ML IJ SOLN
INTRAMUSCULAR | Status: AC
  Filled 2015-05-17: qty 2

## 2015-05-17 MED ORDER — MIDAZOLAM HCL 2 MG/2ML IJ SOLN
INTRAMUSCULAR | Status: AC | PRN
  Administered 2015-05-17: 0.5 mg via INTRAVENOUS

## 2015-05-17 MED ORDER — HEPARIN SODIUM (PORCINE) 1000 UNIT/ML IJ SOLN
INTRAMUSCULAR | Status: AC
  Filled 2015-05-17: qty 1

## 2015-05-17 MED ORDER — HYDROCODONE-ACETAMINOPHEN 5-325 MG PO TABS: 1.0000 | ORAL_TABLET | ORAL | Status: DC | PRN

## 2015-05-17 MED ORDER — FENTANYL CITRATE (PF) 100 MCG/2ML IJ SOLN
INTRAMUSCULAR | Status: AC | PRN
  Administered 2015-05-17: 25 ug via INTRAVENOUS

## 2015-05-17 MED ORDER — HEPARIN SODIUM (PORCINE) 1000 UNIT/ML IJ SOLN
INTRAMUSCULAR | Status: AC | PRN
  Administered 2015-05-17: 2000 [IU] via INTRAVENOUS

## 2015-05-17 MED ORDER — SODIUM CHLORIDE 0.9 % IV SOLN
INTRAVENOUS | Status: DC
  Administered 2015-05-17: 14:00:00 via INTRAVENOUS

## 2015-05-17 NOTE — Sedation Documentation (Signed)
Patient is resting comfortably. 

## 2015-05-17 NOTE — Discharge Instructions (Signed)
Angiogram, Care After °Refer to this sheet in the next few weeks. These instructions provide you with information about caring for yourself after your procedure. Your health care provider may also give you more specific instructions. Your treatment has been planned according to current medical practices, but problems sometimes occur. Call your health care provider if you have any problems or questions after your procedure. °WHAT TO EXPECT AFTER THE PROCEDURE °After your procedure, it is typical to have the following: °· Bruising at the catheter insertion site that usually fades within 1-2 weeks. °· Blood collecting in the tissue (hematoma) that may be painful to the touch. It should usually decrease in size and tenderness within 1-2 weeks. °HOME CARE INSTRUCTIONS °· Take medicines only as directed by your health care provider. °· You may shower 24-48 hours after the procedure or as directed by your health care provider. Remove the bandage (dressing) and gently wash the site with plain soap and water. Pat the area dry with a clean towel. Do not rub the site, because this may cause bleeding. °· Do not take baths, swim, or use a hot tub until your health care provider approves. °· Check your insertion site every day for redness, swelling, or drainage. °· Do not apply powder or lotion to the site. °· Do not lift over 10 lb (4.5 kg) for 5 days after your procedure or as directed by your health care provider. °· Ask your health care provider when it is okay to: °¨ Return to work or school. °¨ Resume usual physical activities or sports. °¨ Resume sexual activity. °· Do not drive home if you are discharged the same day as the procedure. Have someone else drive you. °· You may drive 24 hours after the procedure unless otherwise instructed by your health care provider. °· Do not operate machinery or power tools for 24 hours after the procedure or as directed by your health care provider. °· If your procedure was done as an  outpatient procedure, which means that you went home the same day as your procedure, a responsible adult should be with you for the first 24 hours after you arrive home. °· Keep all follow-up visits as directed by your health care provider. This is important. °SEEK MEDICAL CARE IF: °· You have a fever. °· You have chills. °· You have increased bleeding from the catheter insertion site. Hold pressure on the site. °SEEK IMMEDIATE MEDICAL CARE IF: °· You have unusual pain at the catheter insertion site. °· You have redness, warmth, or swelling at the catheter insertion site. °· You have drainage (other than a small amount of blood on the dressing) from the catheter insertion site. °· The catheter insertion site is bleeding, and the bleeding does not stop after 30 minutes of holding steady pressure on the site. °· The area near or just beyond the catheter insertion site becomes pale, cool, tingly, or numb. °  °This information is not intended to replace advice given to you by your health care provider. Make sure you discuss any questions you have with your health care provider. °  °Document Released: 02/13/2005 Document Revised: 08/18/2014 Document Reviewed: 12/29/2012 °Elsevier Interactive Patient Education ©2016 Elsevier Inc. ° °

## 2015-05-17 NOTE — H&P (Signed)
CC:  Aneurysm  HPI: Denise Maynard is a 59 y.o. female with inicidentally discovered RICA aneurysm who underwent coiling in March of this year, 6 months ago. She did have complication with distal emboli into the right leg causing multiple ulcers which have healed. She has had no neurologic complications. She presents today for elective f/u angiogram. She takes a baby ASA daily.  PMH: Past Medical History  Diagnosis Date  . Carotid artery aneurysm (HCC)     Coil embolization of a supraclinoid right internal carotid artery - Dr. Kathyrn Sheriff  . Hyperlipidemia   . Nicotine addiction 05/15/2015    Will try patch    PSH: Past Surgical History  Procedure Laterality Date  . Mouth surgery    . Fracture surgery      Jaw  . Radiology with anesthesia N/A 10/12/2014    Procedure: Embolization/arteriogram;  Surgeon: Consuella Lose, MD;  Location: [place];  Service: Radiology;  Laterality: N/A;  . Abdominal aortagram N/A 11/24/2014    Procedure: ABDOMINAL AORTAGRAM;  Surgeon: Elam Dutch, MD;  Location[facility] C[facility] LAB;  Service: Cardiovascular;  Laterality: N/A;  . Lower extremity angiogram  11/24/2014    Procedure: LOWER EXTREMITY ANGIOGRAM;  Surgeon: Elam Dutch, MD;  Location[facility] CATH LAB;  Service: Cardiovascular;;  . Aneursym repair  10/2014    SH: Social History  Substance Use Topics  . Smoking status: Current Every Day Smoker -- 0.75 packs/day for 30 years    Types: Cigarettes  . Smokeless tobacco: Never Used  . Alcohol Use: No    MEDS: Prior to Admission medications   Medication Sig Start Date End Date Taking? Authorizing Provider  acetaminophen (TYLENOL) 500 MG tablet Take 1,000 mg by mouth every 6 (six) hours as needed for mild pain or moderate pain.    Yes Historical Provider, MD  aspirin EC 81 MG tablet Take 81 mg by mouth daily.   Yes Historical Provider, MD  cholecalciferol (VITAMIN D) 1000 UNITS tablet Take 1,000 Units by mouth daily.   Yes Historical Provider, MD   meloxicam (MOBIC) 15 MG tablet Take 15 mg by mouth daily as needed for pain.   Yes Historical Provider, MD  Multiple Vitamins-Minerals (MULTIVITAMIN WITH MINERALS) tablet Take 1 tablet by mouth daily.   Yes Historical Provider, MD  Omega-3 Fatty Acids (FISH OIL) 1200 MG CAPS Take 1,200 mg by mouth 2 (two) times daily.   Yes Historical Provider, MD    ALLERGY: No Known Allergies  ROS: ROS  NEUROLOGIC EXAM: Awake, alert, oriented Memory and concentration grossly intact Speech fluent, appropriate CN grossly intact Motor exam: Upper Extremities Deltoid Bicep Tricep Grip  Right 5/5 5/5 5/5 5/5  Left 5/5 5/5 5/5 5/5   Lower Extremity IP Quad PF DF EHL  Right 5/5 5/5 5/5 5/5 5/5  Left 5/5 5/5 5/5 5/5 5/5   Sensation grossly intact to LT  IMPRESSION: - 59 y.o. female 45mo s/p coiling of RICA aneurysm  PLAN: - Proceed with diagnostic angiogram - Likely home post-procedure  I have reviewed the indications, risks, benefits, and alternatives to the angiogram with the patient. Questions were answered, she provided consent

## 2015-05-17 NOTE — Op Note (Signed)
DIAGNOSTIC CEREBRAL ANGIOGRAM    OPERATOR:   Dr. Consuella Lose, MD  HISTORY:   The patient is a 59 y.o. yo female with a history of incidentally discovered RICA aneurysm who underwent coiling 6 months ago. She presents today for routine f/u diagnostic angiogram.  APPROACH:   The technical aspects of the procedure as well as its potential risks and benefits were reviewed with the patient. These risks included but were not limited bleeding, infection, allergic reaction, damage to organs/vital structures, stroke, non-diagnostic procedure, and the catastrophic outcomes of heart attack, coma, and death. With an understanding of these risks, informed consent was obtained and witnessed.    The patient was placed in the supine position on the angiography table and the skin of right groin prepped in the usual sterile fashion. The procedure was performed under local anesthesia (1%-solution of bicarbonate-bufferred Lidoacaine) and conscious sedation with Versed and fentanyl monitored by the in-suite nurse.    A 5- French sheath was introduced in the right common femoral artery using Seldinger technique.  A fluorophase sequence was used to document the sheath position.    HEPARIN: 2000 Units total.   CONTRAST AGENT: 60cc, Omnipaque 300   FLUOROSCOPY TIME: 4.0 combined AP and lateral minutes    CATHETER(S) AND WIRE(S):    5-French JB-1 glidecatheter   0.035" glidewire    VESSELS CATHETERIZED:   Right internal carotid   Left internal carotid   Right vertebral   Left vertebral   Right common femoral  VESSELS STUDIED:   Right common carotid, neck Right internal carotid, head Right vertebral Left internal carotid, head Left vertebral Right femoral  PROCEDURAL NARRATIVE:   A 5-Fr JB-1 terumo glide catheter was advanced over a 0.035 glidewire into the aortic arch. The above vessels were then sequentially catheterized and cervical/cerebral angiograms taken. After review of images, the catheter  was removed without incident.    INTERPRETATION:   Right common carotid: neck:   There is no significant stenosis, occlusion, aneurysm or plaque visualized on this injection.    Right internal carotid: head:   Injection reveals the presence of a widely patent ICA, M1, and A1 segments and their branches. Coil masses seen in the region of the supraclinoid internal carotid artery. No aneurysm recurrence is identified. There is no stenosis.  The parenchymal and venous phases are normal. The venous sinuses are widely patent.    Left internal carotid: head:   Injection reveals the presence of a widely patent ICA, A1, and M1 segments and their branches. There is no significant stenosis, occlusion, aneurysm, or high flow vascular malformation visualized. Incidental note is made of a fetal-type posterior cerebral artery. The parenchymal and venous phases are normal. The venous sinuses are widely patent.    Left vertebral:   Injection reveals the presence of a widely patent vertebral artery. This leads to a widely patent basilar artery that terminates in bilateral P1, although the left P1 is hypoplastic. The basilar apex is normal. There is no significant stenosis, occlusion, aneurysm, or vascular malformation visualized. The parenchymal and venous phases are normal. The venous sinuses are widely patent.    Right vertebral:    Normal vessel. No PICA aneurysm. See basilar description above.    Right femoral:    Normal vessel. No significant atherosclerotic disease. Arterial sheath in adequate position.   DISPOSITION:  Upon completion of the study, the femoral sheath was removed and hemostasis obtained using a 5-Fr ExoSeal closure device. Good proximal and distal lower extremity pulses  were documented upon achievement of hemostasis.    The procedure was well tolerated and no early complications were observed.       The patient was transferred back to the holding area to be positioned flat in bed for 3  hours of observation.    IMPRESSION:  1. Previously coiled distal supraclinoid right internal carotid artery aneurysm without aneurysm residual or recurrence identified.  The preliminary results of this procedure were shared with the patient and the patient's family.

## 2015-05-18 NOTE — Telephone Encounter (Signed)
LMOM to call.

## 2015-05-21 NOTE — Telephone Encounter (Signed)
Pt returned your call ans she said that you could call her back around 4:00

## 2015-05-22 NOTE — Telephone Encounter (Signed)
PT called and I informed her of Dr. Nona Dell recommendations. She said she has had some other problems recently and had so much expense, she wants to wait until December. She will call me back in November to get scheduled. She definitely would like to have it before the end of the year.

## 2015-05-23 NOTE — Telephone Encounter (Signed)
REVIEWED. Triage for TCS IN DEC 2016.

## 2015-06-29 ENCOUNTER — Other Ambulatory Visit: Payer: Self-pay | Admitting: Adult Health

## 2015-06-29 DIAGNOSIS — Z1231 Encounter for screening mammogram for malignant neoplasm of breast: Secondary | ICD-10-CM

## 2015-07-04 ENCOUNTER — Other Ambulatory Visit: Payer: Self-pay

## 2015-07-04 ENCOUNTER — Telehealth: Payer: Self-pay

## 2015-07-04 DIAGNOSIS — Z1211 Encounter for screening for malignant neoplasm of colon: Secondary | ICD-10-CM

## 2015-07-04 NOTE — Telephone Encounter (Signed)
LMOM to call and schedule.

## 2015-07-10 NOTE — Telephone Encounter (Signed)
Gastroenterology Pre-Procedure Review  Request Date:  07/04/2015 Requesting Physician: Dr. Oneida Alar  PATIENT REVIEW QUESTIONS: The patient responded to the following health history questions as indicated:    1. Diabetes Melitis: no 2. Joint replacements in the past 12 months: no 3. Major health problems in the past 3 months: no 4. Has an artificial valve or MVP: no 5. Has a defibrillator: no 6. Has been advised in past to take antibiotics in advance of a procedure like teeth cleaning: no 7. Family history of colon cancer: no    Sister had hx of colon polyps 8. Alcohol Use: no 9. History of sleep apnea: no     MEDICATIONS & ALLERGIES:    Patient reports the following regarding taking any blood thinners:   Plavix? no Aspirin? YES Coumadin? no  Patient confirms/reports the following medications:  Current Outpatient Prescriptions  Medication Sig Dispense Refill  . acetaminophen (TYLENOL) 500 MG tablet Take 1,000 mg by mouth every 6 (six) hours as needed for mild pain or moderate pain.     Marland Kitchen aspirin EC 81 MG tablet Take 81 mg by mouth daily.    . cholecalciferol (VITAMIN D) 1000 UNITS tablet Take 1,000 Units by mouth daily.    . meloxicam (MOBIC) 15 MG tablet Take 15 mg by mouth daily as needed for pain.    . Multiple Vitamins-Minerals (MULTIVITAMIN WITH MINERALS) tablet Take 1 tablet by mouth daily.    . Omega-3 Fatty Acids (FISH OIL) 1200 MG CAPS Take 1,200 mg by mouth 2 (two) times daily.     No current facility-administered medications for this visit.    Patient confirms/reports the following allergies:  No Known Allergies  No orders of the defined types were placed in this encounter.    AUTHORIZATION INFORMATION Primary Insurance: ID #:   Group #:  Pre-Cert / Auth required:  Pre-Cert / Auth #:   Secondary Insurance:   ID #:  Group #:  Pre-Cert / Auth required:  Pre-Cert / Auth #:   SCHEDULE INFORMATION: Procedure has been scheduled as follows:  Date: 07/30/2015,  Time: 1:15 PM  Location: [facility] Short Stay  This Gastroenterology Pre-Precedure Review Form is being routed to the following provider(s): Barney Drain, MD

## 2015-07-10 NOTE — Telephone Encounter (Signed)
PREPOPIK-DRINK WATER TO KEEP URINE LIGHT YELLOW. FULL LIQUIDS WITH BREAKFAST.  Full Liquid Diet A high-calorie, high-protein supplement should be used to meet your nutritional requirements when the full liquid diet is continued for more than 2 or 3 days. If this diet is to be used for an extended period of time (more than 7 days), a multivitamin should be considered.  Breads and Starches  Allowed: None are allowed except crackersWHOLE OR pureed (made into a thick, smooth soup) in soup.   Avoid: Any others.    Potatoes/Pasta/Rice  Allowed: ANY ITEM AS A SOUP OR SMALL PLATE OF MASHED POTATOES OR SCRAMBLED EGGS.       Vegetables  Allowed: Strained tomato or vegetable juice. Vegetables pureed in soup.   Avoid: Any others.    Fruit  Allowed: Any strained fruit juices and fruit drinks. Include 1 serving of citrus or vitamin C-enriched fruit juice daily.   Avoid: Any others.  Meat and Meat Substitutes  Allowed: Egg  Avoid: Any meat, fish, or fowl. All cheese.  Milk  Allowed: SOY Milk beverages, including milk shakes and instant breakfast mixes. Smooth yogurt.   Avoid: Any others. Avoid dairy products if not tolerated.    Soups and Combination Foods  Allowed: Broth, strained cream soups. Strained, broth-based soups.   Avoid: Any others.    Desserts and Sweets  Allowed: flavored gelatin, tapioca, ice cream, sherbet, smooth pudding, junket, fruit ices, frozen ice pops, pudding pops, frozen fudge pops, chocolate syrup. Sugar, honey, jelly, syrup.   Avoid: Any others.  Fats and Oils  Allowed: Margarine, butter, cream, sour cream, oils.   Avoid: Any others.  Beverages  Allowed: All.   Avoid: None.  Condiments  Allowed: Iodized salt, pepper, spices, flavorings. Cocoa powder.   Avoid: Any others.    SAMPLE MEAL PLAN Breakfast   cup orange juice.   1 OR 2 EGGS  1 cup milk.   1 cup beverage (coffee or tea).   Cream or sugar, if desired.     Midmorning Snack  2 SCRAMBLED OR HARD BOILED EGG   Lunch  1 cup cream soup.    cup fruit juice.   1 cup milk.    cup custard.   1 cup beverage (coffee or tea).   Cream or sugar, if desired.    Midafternoon Snack  1 cup milk shake.  Dinner  1 cup cream soup.    cup fruit juice.   1 cup MILK    cup pudding.   1 cup beverage (coffee or tea).   Cream or sugar, if desired.  Evening Snack  1 cup supplement.  To increase calories, add sugar, cream, butter, or margarine if possible. Nutritional supplements will also increase the total calories.

## 2015-07-11 MED ORDER — SOD PICOSULFATE-MAG OX-CIT ACD 10-3.5-12 MG-GM-GM PO PACK: 1.0000 | PACK | ORAL | Status: DC

## 2015-07-11 NOTE — Telephone Encounter (Signed)
Rx sent to the pharmacy and instructions mailed to pt.  

## 2015-07-12 ENCOUNTER — Telehealth: Payer: Self-pay

## 2015-07-12 NOTE — Telephone Encounter (Signed)
Called [employer] at [phone] and spoke to [place] who said PA is required for the screening colonoscopy as outpatient at [facility]. [facility] [place] # [id number].

## 2015-07-23 ENCOUNTER — Ambulatory Visit (HOSPITAL_COMMUNITY)
Admission: RE | Admit: 2015-07-23 | Discharge: 2015-07-23 | Disposition: A | Discharge status: Home / Self Care | Payer: 59 | Source: Ambulatory Visit | Attending: Adult Health | Admitting: Adult Health

## 2015-07-23 DIAGNOSIS — Z1231 Encounter for screening mammogram for malignant neoplasm of breast: Secondary | ICD-10-CM

## 2015-07-30 ENCOUNTER — Ambulatory Visit (HOSPITAL_COMMUNITY): Payer: 59

## 2015-07-30 ENCOUNTER — Encounter (HOSPITAL_COMMUNITY)
Admission: RE | Disposition: A | Discharge status: Home / Self Care | Payer: Self-pay | Source: Ambulatory Visit | Attending: Gastroenterology

## 2015-07-30 ENCOUNTER — Encounter (HOSPITAL_COMMUNITY): Payer: Self-pay | Admitting: *Deleted

## 2015-07-30 ENCOUNTER — Ambulatory Visit (HOSPITAL_COMMUNITY)
Admission: RE | Admit: 2015-07-30 | Discharge: 2015-07-30 | Disposition: A | Discharge status: Home / Self Care | Payer: 59 | Source: Ambulatory Visit | Attending: Gastroenterology | Admitting: Gastroenterology

## 2015-07-30 DIAGNOSIS — F17210 Nicotine dependence, cigarettes, uncomplicated: Secondary | ICD-10-CM | POA: Insufficient documentation

## 2015-07-30 DIAGNOSIS — Z8673 Personal history of transient ischemic attack (TIA), and cerebral infarction without residual deficits: Secondary | ICD-10-CM | POA: Diagnosis not present

## 2015-07-30 DIAGNOSIS — K5730 Diverticulosis of large intestine without perforation or abscess without bleeding: Secondary | ICD-10-CM | POA: Diagnosis not present

## 2015-07-30 DIAGNOSIS — Z1211 Encounter for screening for malignant neoplasm of colon: Secondary | ICD-10-CM | POA: Diagnosis not present

## 2015-07-30 DIAGNOSIS — Z79899 Other long term (current) drug therapy: Secondary | ICD-10-CM | POA: Insufficient documentation

## 2015-07-30 DIAGNOSIS — E785 Hyperlipidemia, unspecified: Secondary | ICD-10-CM | POA: Insufficient documentation

## 2015-07-30 DIAGNOSIS — Z7982 Long term (current) use of aspirin: Secondary | ICD-10-CM | POA: Insufficient documentation

## 2015-07-30 DIAGNOSIS — K648 Other hemorrhoids: Secondary | ICD-10-CM | POA: Insufficient documentation

## 2015-07-30 DIAGNOSIS — Z86010 Personal history of colonic polyps: Secondary | ICD-10-CM

## 2015-07-30 HISTORY — PX: COLONOSCOPY: SHX5424

## 2015-07-30 SURGERY — COLONOSCOPY
Anesthesia: Moderate Sedation

## 2015-07-30 MED ORDER — MEPERIDINE HCL 100 MG/ML IJ SOLN
INTRAMUSCULAR | Status: DC | PRN
  Administered 2015-07-30 (×2): 25 mg via INTRAVENOUS

## 2015-07-30 MED ORDER — MIDAZOLAM HCL 5 MG/5ML IJ SOLN
INTRAMUSCULAR | Status: AC
  Filled 2015-07-30: qty 10

## 2015-07-30 MED ORDER — MIDAZOLAM HCL 5 MG/5ML IJ SOLN
INTRAMUSCULAR | Status: DC | PRN
  Administered 2015-07-30 (×2): 2 mg via INTRAVENOUS
  Administered 2015-07-30: 1 mg via INTRAVENOUS

## 2015-07-30 MED ORDER — STERILE WATER FOR IRRIGATION IR SOLN
Status: DC | PRN
  Administered 2015-07-30: 12:00:00

## 2015-07-30 MED ORDER — SODIUM CHLORIDE 0.9 % IV SOLN
INTRAVENOUS | Status: DC
  Administered 2015-07-30: 12:00:00 via INTRAVENOUS

## 2015-07-30 MED ORDER — MEPERIDINE HCL 100 MG/ML IJ SOLN
INTRAMUSCULAR | Status: AC
  Filled 2015-07-30: qty 2

## 2015-07-30 NOTE — Op Note (Signed)
[facility] [address] [place], [address]   COLONOSCOPY PROCEDURE REPORT  PATIENT: Denise Maynard, Denise Maynard  MR#: [id number] BIRTHDATE: [birth year] , 72  yrs. old GENDER: female ENDOSCOPIST: Danie Binder, MD REFERRED [id number] Hilma Favors, M.D. PROCEDURE DATE:  09-Aug-2015 PROCEDURE:   Colonoscopy, screening INDICATIONS:high risk patient with personal history of colonic polyps: 6 MM SIMPLE ADENOMA([place]) IN 2010. SISTER HAS POLYPS. MEDICATIONS: Demerol 50 mg IV and Versed 5 mg IV  DESCRIPTION OF PROCEDURE:    Physical exam was performed.  Informed consent was obtained from the patient after explaining the benefits, risks, and alternatives to procedure.  The patient was connected to monitor and placed in left lateral position. Continuous oxygen was provided by nasal cannula and IV medicine administered through an indwelling cannula.  After administration of sedation and rectal exam, the patients rectum was intubated and the [id number]  colonoscope was advanced under direct visualization to the cecum.  The scope was removed slowly by carefully examining the color, texture, anatomy, and integrity mucosa on the way out.  The patient was recovered in endoscopy and discharged home in satisfactory condition. Estimated blood loss is zero unless otherwise noted in this procedure report.    COLON FINDINGS: There was mild diverticulosis noted in the descending colon, sigmoid colon, ascending colon, and at the hepatic flexure with associated muscular hypertrophy.  , The examination was otherwise normal.  , and Moderate sized internal hemorrhoids were found.  PREP QUALITY: good. CECAL W/D TIME: 17       minutes COMPLICATIONS: None  ENDOSCOPIC IMPRESSION: 1.   Mild pan-colonic diverticuolsis 2.   Moderate sized internal hemorrhoids  RECOMMENDATIONS: HIGH FIBER DIET NEXT TCS IN 5-10 YEARS      _______________________________ eSignedDanie Binder, MD 08/09/15  2:13 PM    CPT CODES: ICD CODES:  The ICD and CPT codes recommended by this software are interpretations from the data that the clinical staff has captured with the software.  The verification of the translation of this report to the ICD and CPT codes and modifiers is the sole responsibility of the health care institution and practicing physician where this report was generated.  [employer] will not be held responsible for the validity of the ICD and CPT codes included on this report.  AMA assumes no liability for data contained or not contained herein. CPT is a Designer, television/film set of the [employer].

## 2015-07-30 NOTE — H&P (Signed)
Primary Care Physician:  Purvis Kilts, MD Primary Gastroenterologist:  Dr. Oneida Alar  Pre-Procedure History & Physical: HPI:  Denise Maynard is a 59 y.o. female here for [facility].  Past Medical History  Diagnosis Date  . Carotid artery aneurysm (HCC)     Coil embolization of a supraclinoid right internal carotid artery - Dr. Kathyrn Sheriff  . Hyperlipidemia   . Nicotine addiction 05/15/2015    Will try patch  . Stroke [facility])     Past Surgical History  Procedure Laterality Date  . Mouth surgery    . Fracture surgery      Jaw  . Radiology with anesthesia N/A 10/12/2014    Procedure: Embolization/arteriogram;  Surgeon: Consuella Lose, MD;  Location: [facility];  Service: Radiology;  Laterality: N/A;  . Abdominal aortagram N/A 11/24/2014    Procedure: ABDOMINAL AORTAGRAM;  Surgeon: Elam Dutch, MD;  Location: [facility] CATH LAB;  Service: Cardiovascular;  Laterality: N/A;  . Lower extremity angiogram  11/24/2014    Procedure: LOWER EXTREMITY ANGIOGRAM;  Surgeon: Elam Dutch, MD;  Location: [facility] CATH LAB;  Service: Cardiovascular;;  . Aneursym repair  10/2014    Prior to Admission medications   Medication Sig Start Date End Date Taking? Authorizing Provider  aspirin EC 81 MG tablet Take 81 mg by mouth daily.   Yes Historical Provider, MD  cholecalciferol (VITAMIN D) 1000 UNITS tablet Take 1,000 Units by mouth daily.   Yes Historical Provider, MD  Multiple Vitamins-Minerals (MULTIVITAMIN WITH MINERALS) tablet Take 1 tablet by mouth daily.   Yes Historical Provider, MD  Omega-3 Fatty Acids (FISH OIL) 1200 MG CAPS Take 1,200 mg by mouth 2 (two) times daily.   Yes Historical Provider, MD  Sod Picosulfate-Mag Ox-Cit Acd 10-3.5-12 MG-GM-GM PACK Take 1 Container by mouth as directed. 07/11/15  Yes Danie Binder, MD  acetaminophen (TYLENOL) 500 MG tablet Take 1,000 mg by mouth every 6 (six) hours as needed for mild pain or moderate pain.     Historical Provider, MD  meloxicam  (MOBIC) 15 MG tablet Take 15 mg by mouth daily as needed for pain.    Historical Provider, MD    Allergies as of 07/04/2015  . (No Known Allergies)    Family History  Problem Relation Age of Onset  . Heart attack Mother   . Diabetes Mother   . Throat cancer Father   . Hypertension Father   . Hyperlipidemia Sister   . Diabetes Maternal Grandmother   . Cancer Maternal Grandfather   . Heart attack Paternal Grandfather   . Hyperlipidemia Sister     Social History   Social History  . Marital Status: Divorced    Spouse Name: N/A  . Number of Children: N/A  . Years of Education: N/A   Occupational History  . Not on file.   Social History Main Topics  . Smoking status: Current Every Day Smoker -- 0.50 packs/day for 30 years    Types: Cigarettes  . Smokeless tobacco: Never Used  . Alcohol Use: No  . Drug Use: No  . Sexual Activity: Yes    Birth Control/ Protection: Post-menopausal   Other Topics Concern  . Not on file   Social History Narrative    Review of Systems: See HPI, otherwise negative ROS   Physical Exam: BP 118/81 mmHg  Pulse 58  Temp(Src) 98.2 F (36.8 C) (Oral)  Resp 15  Ht 5\' 3"  (1.6 m)  Wt 137 lb (62.143 kg)  BMI 24.27  kg/m2  SpO2 99% General:   Alert,  pleasant and cooperative in NAD Head:  Normocephalic and atraumatic. Neck:  Supple; Lungs:  Clear throughout to auscultation.    Heart:  Regular rate and rhythm. Abdomen:  Soft, nontender and nondistended. Normal bowel sounds, without guarding, and without rebound.   Neurologic:  Alert and  oriented x4;  grossly normal neurologically.  Impression/Plan:     SCREENING  Plan:  1. TCS TODAY

## 2015-07-30 NOTE — Discharge Instructions (Signed)
You have internal hemorrhoids. YOU DID NOT HAVE ANY POLYPS.   FOLLOW A HIGH FIBER DIET. AVOID ITEMS THAT CAUSE BLOATING. SEE INFO BELOW.  Next colonoscopy in 5-10 years. Colonoscopy Care After Read the instructions outlined below and refer to this sheet in the next week. These discharge instructions provide you with general information on caring for yourself after you leave the hospital. While your treatment has been planned according to the most current medical practices available, unavoidable complications occasionally occur. If you have any problems or questions after discharge, call DR. Berit Raczkowski, [phone].  ACTIVITY  You may resume your regular activity, but move at a slower pace for the next 24 hours.   Take frequent rest periods for the next 24 hours.   Walking will help get rid of the air and reduce the bloated feeling in your belly (abdomen).   No driving for 24 hours (because of the medicine (anesthesia) used during the test).   You may shower.   Do not sign any important legal documents or operate any machinery for 24 hours (because of the anesthesia used during the test).    NUTRITION  Drink plenty of fluids.   You may resume your normal diet as instructed by your doctor.   Begin with a light meal and progress to your normal diet. Heavy or fried foods are harder to digest and may make you feel sick to your stomach (nauseated).   Avoid alcoholic beverages for 24 hours or as instructed.    MEDICATIONS  You may resume your normal medications.   WHAT YOU CAN EXPECT TODAY  Some feelings of bloating in the abdomen.   Passage of more gas than usual.   Spotting of blood in your stool or on the toilet paper  .  IF YOU HAD POLYPS REMOVED DURING THE COLONOSCOPY:  Eat a soft diet IF YOU HAVE NAUSEA, BLOATING, ABDOMINAL PAIN, OR VOMITING.    FINDING OUT THE RESULTS OF YOUR TEST Not all test results are available during your visit. DR. Oneida Alar WILL CALL YOU  WITHIN 7 DAYS OF YOUR PROCEDUE WITH YOUR RESULTS. Do not assume everything is normal if you have not heard from DR. Malaka Ruffner IN ONE WEEK, CALL HER OFFICE AT [phone].  SEEK IMMEDIATE MEDICAL ATTENTION AND CALL THE OFFICE: [phone] IF:  You have more than a spotting of blood in your stool.   Your belly is swollen (abdominal distention).   You are nauseated or vomiting.   You have a temperature over 101F.   You have abdominal pain or discomfort that is severe or gets worse throughout the day.  High-Fiber Diet A high-fiber diet changes your normal diet to include more whole grains, legumes, fruits, and vegetables. Changes in the diet involve replacing refined carbohydrates with unrefined foods. The calorie level of the diet is essentially unchanged. The Dietary Reference Intake (recommended amount) for adult males is 38 grams per day. For adult females, it is 25 grams per day. Pregnant and lactating women should consume 28 grams of fiber per day. Fiber is the intact part of a plant that is not broken down during digestion. Functional fiber is fiber that has been isolated from the plant to provide a beneficial effect in the body. PURPOSE  Increase stool bulk.   Ease and regulate bowel movements.   Lower cholesterol.  REDUCE RISK OF COLON CANCER  INDICATIONS THAT YOU NEED MORE FIBER  Constipation and hemorrhoids.   Uncomplicated diverticulosis (intestine condition) and irritable bowel syndrome.   Weight  management.   As a protective measure against hardening of the arteries (atherosclerosis), diabetes, and cancer.   GUIDELINES FOR INCREASING FIBER IN THE DIET  Start adding fiber to the diet slowly. A gradual increase of about 5 more grams (2 slices of whole-wheat bread, 2 servings of most fruits or vegetables, or 1 bowl of high-fiber cereal) per day is best. Too rapid an increase in fiber may result in constipation, flatulence, and bloating.   Drink enough water and fluids to  keep your urine clear or pale yellow. Water, juice, or caffeine-free drinks are recommended. Not drinking enough fluid may cause constipation.   Eat a variety of high-fiber foods rather than one type of fiber.   Try to increase your intake of fiber through using high-fiber foods rather than fiber pills or supplements that contain small amounts of fiber.   The goal is to change the types of food eaten. Do not supplement your present diet with high-fiber foods, but replace foods in your present diet.   INCLUDE A VARIETY OF FIBER SOURCES  Replace refined and processed grains with whole grains, canned fruits with fresh fruits, and incorporate other fiber sources. White rice, white breads, and most bakery goods contain little or no fiber.   Brown whole-grain rice, buckwheat oats, and many fruits and vegetables are all good sources of fiber. These include: broccoli, Brussels sprouts, cabbage, cauliflower, beets, sweet potatoes, white potatoes (skin on), carrots, tomatoes, eggplant, squash, berries, fresh fruits, and dried fruits.   Cereals appear to be the richest source of fiber. Cereal fiber is found in whole grains and bran. Bran is the fiber-rich outer coat of cereal grain, which is largely removed in refining. In whole-grain cereals, the bran remains. In breakfast cereals, the largest amount of fiber is found in those with "bran" in their names. The fiber content is sometimes indicated on the label.   You may need to include additional fruits and vegetables each day.   In baking, for 1 cup white flour, you may use the following substitutions:   1 cup whole-wheat flour minus 2 tablespoons.   1/2 cup white flour plus 1/2 cup whole-wheat flour.   Hemorrhoids Hemorrhoids are dilated (enlarged) veins around the rectum. Sometimes clots will form in the veins. This makes them swollen and painful. These are called thrombosed hemorrhoids. Causes of hemorrhoids include:  Constipation.   Straining  to have a bowel movement.   HEAVY LIFTING  HOME CARE INSTRUCTIONS  Eat a well balanced diet and drink 6 to 8 glasses of water every day to avoid constipation. You may also use a bulk laxative.   Avoid straining to have bowel movements.   Keep anal area dry and clean.   Do not use a donut shaped pillow or sit on the toilet for long periods. This increases blood pooling and pain.   Move your bowels when your body has the urge; this will require less straining and will decrease pain and pressure.

## 2015-08-07 ENCOUNTER — Encounter (HOSPITAL_COMMUNITY): Payer: Self-pay | Admitting: Gastroenterology

## 2015-12-24 ENCOUNTER — Ambulatory Visit (HOSPITAL_COMMUNITY)
Admission: RE | Admit: 2015-12-24 | Discharge: 2015-12-24 | Disposition: A | Discharge status: Home / Self Care | Payer: 59 | Source: Ambulatory Visit | Attending: Physician Assistant | Admitting: Physician Assistant

## 2015-12-24 ENCOUNTER — Other Ambulatory Visit (HOSPITAL_COMMUNITY): Payer: Self-pay | Admitting: Physician Assistant

## 2015-12-24 DIAGNOSIS — K148 Other diseases of tongue: Secondary | ICD-10-CM | POA: Diagnosis present

## 2015-12-24 DIAGNOSIS — M542 Cervicalgia: Secondary | ICD-10-CM | POA: Insufficient documentation

## 2017-01-13 ENCOUNTER — Other Ambulatory Visit (HOSPITAL_COMMUNITY): Payer: Self-pay | Admitting: Family Medicine

## 2017-01-13 ENCOUNTER — Ambulatory Visit (HOSPITAL_COMMUNITY)
Admission: RE | Admit: 2017-01-13 | Discharge: 2017-01-13 | Disposition: A | Discharge status: Home / Self Care | Payer: Managed Care, Other (non HMO) | Source: Ambulatory Visit | Attending: Family Medicine | Admitting: Family Medicine

## 2017-01-13 DIAGNOSIS — M7731 Calcaneal spur, right foot: Secondary | ICD-10-CM | POA: Diagnosis not present

## 2017-01-13 DIAGNOSIS — M79671 Pain in right foot: Secondary | ICD-10-CM

## 2017-01-13 DIAGNOSIS — M19071 Primary osteoarthritis, right ankle and foot: Secondary | ICD-10-CM | POA: Diagnosis not present

## 2017-11-17 ENCOUNTER — Other Ambulatory Visit (HOSPITAL_COMMUNITY)
Admission: RE | Admit: 2017-11-17 | Discharge: 2017-11-17 | Disposition: A | Discharge status: Home / Self Care | Payer: BLUE CROSS/BLUE SHIELD | Source: Ambulatory Visit | Attending: Adult Health | Admitting: Adult Health

## 2017-11-17 ENCOUNTER — Ambulatory Visit (INDEPENDENT_AMBULATORY_CARE_PROVIDER_SITE_OTHER): Payer: BLUE CROSS/BLUE SHIELD | Admitting: Adult Health

## 2017-11-17 ENCOUNTER — Encounter: Payer: Self-pay | Admitting: Adult Health

## 2017-11-17 VITALS — BP 110/60 | HR 76 | Resp 15 | Ht 65.0 in | Wt 145.0 lb

## 2017-11-17 DIAGNOSIS — Z1212 Encounter for screening for malignant neoplasm of rectum: Secondary | ICD-10-CM | POA: Diagnosis not present

## 2017-11-17 DIAGNOSIS — Z01419 Encounter for gynecological examination (general) (routine) without abnormal findings: Secondary | ICD-10-CM | POA: Insufficient documentation

## 2017-11-17 DIAGNOSIS — Z1329 Encounter for screening for other suspected endocrine disorder: Secondary | ICD-10-CM

## 2017-11-17 DIAGNOSIS — Z8679 Personal history of other diseases of the circulatory system: Secondary | ICD-10-CM | POA: Insufficient documentation

## 2017-11-17 DIAGNOSIS — E7800 Pure hypercholesterolemia, unspecified: Secondary | ICD-10-CM | POA: Diagnosis not present

## 2017-11-17 DIAGNOSIS — Z1211 Encounter for screening for malignant neoplasm of colon: Secondary | ICD-10-CM

## 2017-11-17 DIAGNOSIS — E785 Hyperlipidemia, unspecified: Secondary | ICD-10-CM | POA: Insufficient documentation

## 2017-11-17 DIAGNOSIS — E782 Mixed hyperlipidemia: Secondary | ICD-10-CM | POA: Insufficient documentation

## 2017-11-17 LAB — HEMOCCULT GUIAC POC 1CARD (OFFICE): Fecal Occult Blood, POC: NEGATIVE

## 2017-11-17 NOTE — Progress Notes (Signed)
Patient ID: Denise Maynard, female   DOB: 1956-05-08, 62 y.o.   MRN: [id number] History of Present Illness: Denise Maynard is a 62 year old white female, divorced,retired, PM in for a well woman gyn exam and pap. PCP is Dr Hilma Favors.    Current Medications, Allergies, Past Medical History, Past Surgical History, Family History and Social History were reviewed in [employer] record.     Review of Systems:  Patient denies any headaches, hearing loss, fatigue, blurred vision, shortness of breath, chest pain, abdominal pain, problems with bowel movements, urination, or intercourse. No joint pain or mood swings.   Physical Exam:BP 110/60 (BP Location: Left Arm, Patient Position: Sitting, Cuff Size: Normal)   Pulse 76   Resp 15   Ht 5\' 5"  (1.651 m)   Wt 145 lb (65.8 kg)   BMI 24.13 kg/m  General:  Well developed, well nourished, no acute distress Skin:  Warm and dry Neck:  Midline trachea, normal thyroid, good ROM, no lymphadenopathy.no carotid bruits heard  Lungs; Clear to auscultation bilaterally Breast:  No dominant palpable mass, retraction, or nipple discharge Cardiovascular: Regular rate and rhythm Abdomen:  Soft, non tender, no hepatosplenomegaly Pelvic:  External genitalia is normal in appearance, no lesions.  The vagina is pale, with loss of moisture and rugae. Urethra has no lesions or masses. The cervix is smooth,pap with HPV performed.  Uterus is felt to be normal size, shape, and contour.  No adnexal masses or tenderness noted.Bladder is non tender, no masses felt. Rectal: Good sphincter tone, no polyps, or hemorrhoids felt.  Hemoccult negative. Extremities/musculoskeletal:  No swelling or varicosities noted, no clubbing or cyanosis Psych:  No mood changes, alert and cooperative,seems happy PHQ 2 score 0.   Impression: 1. Encounter for gynecological examination with Papanicolaou smear of cervix   2. Screening for colorectal cancer   3. Elevated cholesterol   4.  History of aneurysm   5. Screening for thyroid disorder       Plan: Check CBC,CMP,TSH and lipids Physical in 1 year Pap in 3 if normal Mammogram now and yearly Colonoscopy per GI  F/U with neurosurgeon

## 2017-11-18 ENCOUNTER — Other Ambulatory Visit: Payer: Self-pay | Admitting: Adult Health

## 2017-11-18 DIAGNOSIS — Z1231 Encounter for screening mammogram for malignant neoplasm of breast: Secondary | ICD-10-CM

## 2017-11-18 LAB — LIPID PANEL
CHOLESTEROL TOTAL: 249 mg/dL — AB (ref 100–199)
Chol/HDL Ratio: 4 ratio (ref 0.0–4.4)
HDL: 62 mg/dL (ref >39)
LDL CALC: 151 mg/dL — AB (ref 0–99)
TRIGLYCERIDES: 181 mg/dL — AB (ref 0–149)
VLDL Cholesterol Cal: 36 mg/dL (ref 5–40)

## 2017-11-18 LAB — COMPREHENSIVE METABOLIC PANEL
ALT: 19 IU/L (ref 0–32)
AST: 19 IU/L (ref 0–40)
Albumin/Globulin Ratio: 1.8 (ref 1.2–2.2)
Albumin: 4.8 g/dL (ref 3.6–4.8)
Alkaline Phosphatase: 106 IU/L (ref 39–117)
BUN/Creatinine Ratio: 15 (ref 12–28)
BUN: 11 mg/dL (ref 8–27)
Bilirubin Total: 0.4 mg/dL (ref 0.0–1.2)
CALCIUM: 10.9 mg/dL — AB (ref 8.7–10.3)
CO2: 25 mmol/L (ref 20–29)
CREATININE: 0.74 mg/dL (ref 0.57–1.00)
Chloride: 102 mmol/L (ref 96–106)
GFR calc Af Amer: 100 mL/min/{1.73_m2} (ref >59)
GFR, EST NON AFRICAN AMERICAN: 86 mL/min/{1.73_m2} (ref >59)
GLOBULIN, TOTAL: 2.6 g/dL (ref 1.5–4.5)
Glucose: 82 mg/dL (ref 65–99)
Potassium: 6.2 mmol/L — ABNORMAL HIGH (ref 3.5–5.2)
Sodium: 143 mmol/L (ref 134–144)
Total Protein: 7.4 g/dL (ref 6.0–8.5)

## 2017-11-18 LAB — CYTOLOGY - PAP
DIAGNOSIS: NEGATIVE
HPV: NOT DETECTED

## 2017-11-18 LAB — TSH: TSH: 1.33 u[IU]/mL (ref 0.450–4.500)

## 2017-11-18 LAB — CBC
HEMATOCRIT: 50.8 % — AB (ref 34.0–46.6)
Hemoglobin: 16.5 g/dL — ABNORMAL HIGH (ref 11.1–15.9)
MCH: 31 pg (ref 26.6–33.0)
MCHC: 32.5 g/dL (ref 31.5–35.7)
MCV: 96 fL (ref 79–97)
PLATELETS: 781 10*3/uL — AB (ref 150–379)
RBC: 5.32 x10E6/uL — ABNORMAL HIGH (ref 3.77–5.28)
RDW: 15.5 % — AB (ref 12.3–15.4)
WBC: 11.9 10*3/uL — AB (ref 3.4–10.8)

## 2017-11-19 ENCOUNTER — Telehealth: Payer: Self-pay | Admitting: Adult Health

## 2017-11-19 ENCOUNTER — Encounter: Payer: Self-pay | Admitting: Adult Health

## 2017-11-19 DIAGNOSIS — D72829 Elevated white blood cell count, unspecified: Secondary | ICD-10-CM

## 2017-11-19 DIAGNOSIS — E875 Hyperkalemia: Secondary | ICD-10-CM | POA: Insufficient documentation

## 2017-11-19 HISTORY — DX: Hypercalcemia: E83.52

## 2017-11-19 HISTORY — DX: Hyperkalemia: E87.5

## 2017-11-19 NOTE — Telephone Encounter (Signed)
Pt aware of labs, will recheck CBC,CMP,and add PTH,magnesium and phosphorus and vitamin D

## 2017-11-20 ENCOUNTER — Ambulatory Visit (HOSPITAL_COMMUNITY): Payer: Managed Care, Other (non HMO)

## 2017-11-21 LAB — COMPREHENSIVE METABOLIC PANEL
ALT: 16 IU/L (ref 0–32)
AST: 20 IU/L (ref 0–40)
Albumin/Globulin Ratio: 1.6 (ref 1.2–2.2)
Albumin: 4.5 g/dL (ref 3.6–4.8)
Alkaline Phosphatase: 96 IU/L (ref 39–117)
BUN/Creatinine Ratio: 13 (ref 12–28)
BUN: 8 mg/dL (ref 8–27)
Bilirubin Total: 0.2 mg/dL (ref 0.0–1.2)
CO2: 24 mmol/L (ref 20–29)
Calcium: 10.4 mg/dL — ABNORMAL HIGH (ref 8.7–10.3)
Chloride: 101 mmol/L (ref 96–106)
Creatinine, Ser: 0.63 mg/dL (ref 0.57–1.00)
GFR calc Af Amer: 110 mL/min/{1.73_m2} (ref >59)
GFR calc non Af Amer: 96 mL/min/{1.73_m2} (ref >59)
Globulin, Total: 2.8 g/dL (ref 1.5–4.5)
Glucose: 81 mg/dL (ref 65–99)
Potassium: 5.9 mmol/L — ABNORMAL HIGH (ref 3.5–5.2)
Sodium: 140 mmol/L (ref 134–144)
Total Protein: 7.3 g/dL (ref 6.0–8.5)

## 2017-11-21 LAB — PTH, INTACT AND CALCIUM: PTH: 24 pg/mL (ref 15–65)

## 2017-11-21 LAB — CBC
HEMATOCRIT: 47.9 % — AB (ref 34.0–46.6)
Hemoglobin: 16.2 g/dL — ABNORMAL HIGH (ref 11.1–15.9)
MCH: 31.8 pg (ref 26.6–33.0)
MCHC: 33.8 g/dL (ref 31.5–35.7)
MCV: 94 fL (ref 79–97)
Platelets: 767 10*3/uL — ABNORMAL HIGH (ref 150–379)
RBC: 5.09 x10E6/uL (ref 3.77–5.28)
RDW: 15.4 % (ref 12.3–15.4)
WBC: 12.7 10*3/uL — AB (ref 3.4–10.8)

## 2017-11-21 LAB — PHOSPHORUS: PHOSPHORUS: 3.8 mg/dL (ref 2.5–4.5)

## 2017-11-21 LAB — VITAMIN D 25 HYDROXY (VIT D DEFICIENCY, FRACTURES): VIT D 25 HYDROXY: 35.1 ng/mL (ref 30.0–100.0)

## 2017-11-21 LAB — MAGNESIUM: Magnesium: 2.2 mg/dL (ref 1.6–2.3)

## 2017-11-24 ENCOUNTER — Telehealth: Payer: Self-pay | Admitting: Adult Health

## 2017-11-24 DIAGNOSIS — D72828 Other elevated white blood cell count: Secondary | ICD-10-CM

## 2017-11-24 DIAGNOSIS — E875 Hyperkalemia: Secondary | ICD-10-CM

## 2017-11-24 MED ORDER — SODIUM POLYSTYRENE SULFONATE 15 GM/60ML PO SUSP: ORAL | 0 refills | Status: DC

## 2017-11-24 NOTE — Telephone Encounter (Signed)
Pt aware potassium 5.9, calcium 10.4, will refer to Dr Lowanda Foster, recheck labs Monday 4/22 and rx kayexalate 30 gm now and repeat dose in 4 hours

## 2017-11-25 ENCOUNTER — Telehealth: Payer: Self-pay | Admitting: *Deleted

## 2017-11-25 MED ORDER — SODIUM POLYSTYRENE SULFONATE 15 GM/60ML PO SUSP: ORAL | 0 refills | Status: DC

## 2017-11-25 NOTE — Telephone Encounter (Signed)
Pt could not find Kayexalate, so I called [facility] and they have it, so I ordered it there, and she is aware to go get today.

## 2017-11-27 ENCOUNTER — Ambulatory Visit (HOSPITAL_COMMUNITY)
Admission: RE | Admit: 2017-11-27 | Discharge: 2017-11-27 | Disposition: A | Discharge status: Home / Self Care | Payer: BLUE CROSS/BLUE SHIELD | Source: Ambulatory Visit | Attending: Adult Health | Admitting: Adult Health

## 2017-11-27 DIAGNOSIS — Z1231 Encounter for screening mammogram for malignant neoplasm of breast: Secondary | ICD-10-CM | POA: Diagnosis present

## 2017-12-01 ENCOUNTER — Telehealth: Payer: Self-pay | Admitting: Adult Health

## 2017-12-01 ENCOUNTER — Telehealth: Payer: Self-pay | Admitting: Obstetrics & Gynecology

## 2017-12-01 ENCOUNTER — Telehealth (HOSPITAL_COMMUNITY): Payer: Self-pay | Admitting: Adult Health

## 2017-12-01 LAB — CBC WITH DIFFERENTIAL/PLATELET
Basophils Absolute: 0.1 10*3/uL (ref 0.0–0.2)
Basos: 1 %
EOS (ABSOLUTE): 0.2 10*3/uL (ref 0.0–0.4)
EOS: 1 %
HEMATOCRIT: 51.4 % — AB (ref 34.0–46.6)
HEMOGLOBIN: 17.2 g/dL — AB (ref 11.1–15.9)
Immature Grans (Abs): 0.1 10*3/uL (ref 0.0–0.1)
Immature Granulocytes: 1 %
LYMPHS ABS: 2.2 10*3/uL (ref 0.7–3.1)
Lymphs: 17 %
MCH: 31.7 pg (ref 26.6–33.0)
MCHC: 33.5 g/dL (ref 31.5–35.7)
MCV: 95 fL (ref 79–97)
MONOCYTES: 11 %
MONOS ABS: 1.4 10*3/uL — AB (ref 0.1–0.9)
NEUTROS ABS: 9.2 10*3/uL — AB (ref 1.4–7.0)
Neutrophils: 69 %
Platelets: 900 10*3/uL (ref 150–379)
RBC: 5.43 x10E6/uL — ABNORMAL HIGH (ref 3.77–5.28)
RDW: 15.7 % — AB (ref 12.3–15.4)
WBC: 13.1 10*3/uL — ABNORMAL HIGH (ref 3.4–10.8)

## 2017-12-01 LAB — COMPREHENSIVE METABOLIC PANEL
ALBUMIN: 4.8 g/dL (ref 3.6–4.8)
ALT: 16 IU/L (ref 0–32)
AST: 20 IU/L (ref 0–40)
Albumin/Globulin Ratio: 1.7 (ref 1.2–2.2)
Alkaline Phosphatase: 102 IU/L (ref 39–117)
BUN/Creatinine Ratio: 10 — ABNORMAL LOW (ref 12–28)
BUN: 7 mg/dL — AB (ref 8–27)
Bilirubin Total: 0.3 mg/dL (ref 0.0–1.2)
CALCIUM: 10.7 mg/dL — AB (ref 8.7–10.3)
CHLORIDE: 102 mmol/L (ref 96–106)
CO2: 23 mmol/L (ref 20–29)
Creatinine, Ser: 0.67 mg/dL (ref 0.57–1.00)
GFR calc non Af Amer: 94 mL/min/{1.73_m2} (ref >59)
GFR, EST AFRICAN AMERICAN: 108 mL/min/{1.73_m2} (ref >59)
Globulin, Total: 2.9 g/dL (ref 1.5–4.5)
Glucose: 65 mg/dL (ref 65–99)
Potassium: 6.3 mmol/L — ABNORMAL HIGH (ref 3.5–5.2)
Sodium: 143 mmol/L (ref 134–144)
Total Protein: 7.7 g/dL (ref 6.0–8.5)

## 2017-12-01 NOTE — Telephone Encounter (Signed)
[facility] called and Dr Lowanda Foster has ordered more labs and has referred her to hematology/ oncology  at [facility] and she has appt there on Friday 4/26.

## 2017-12-01 NOTE — Telephone Encounter (Signed)
Received a call from Dr. Lowanda Foster regarding lab concerns.  Nephrology seen patient for hyperkalemia.  However at time of office encounter, Dr. Lowanda Foster noted patient's platelets markedly elevated at 900,000 and progressively increasing from previous lab draws.  Her WBCs are also elevated at 13.1.  Hemoglobin and hematocrit also elevated at 17.2/51.4%.  I have asked Dr. Florentina Addison office to send over formal referral to the cancer center, and we would be happy to see the patient urgently for evaluation.  Will make arrangements for patient to be seen this Friday, 12/04/17 with Dr. Delton Coombes. Discussed with our scheduling team here at [facility].  They will call the patient to make her aware of new patient visit.    Mike Craze, NP [place] [phone]

## 2017-12-01 NOTE — Telephone Encounter (Signed)
Pt aware that K+,Denise Maynard Denise Maynard all went up, I called Dr Florentina Addison office Denise [facility] said they would see her today, Denise pt is aware she needs to call them now for a time to be seen today.

## 2017-12-04 ENCOUNTER — Encounter (HOSPITAL_COMMUNITY): Payer: Self-pay | Admitting: Hematology

## 2017-12-04 ENCOUNTER — Inpatient Hospital Stay (HOSPITAL_COMMUNITY): Payer: BLUE CROSS/BLUE SHIELD | Attending: Hematology | Admitting: Hematology

## 2017-12-04 ENCOUNTER — Inpatient Hospital Stay (HOSPITAL_COMMUNITY): Payer: BLUE CROSS/BLUE SHIELD

## 2017-12-04 ENCOUNTER — Other Ambulatory Visit: Payer: Self-pay

## 2017-12-04 VITALS — BP 141/68 | HR 73 | Temp 98.1°F | Resp 18 | Wt 142.7 lb

## 2017-12-04 DIAGNOSIS — D72829 Elevated white blood cell count, unspecified: Secondary | ICD-10-CM

## 2017-12-04 DIAGNOSIS — D582 Other hemoglobinopathies: Secondary | ICD-10-CM | POA: Diagnosis not present

## 2017-12-04 DIAGNOSIS — D751 Secondary polycythemia: Secondary | ICD-10-CM

## 2017-12-04 DIAGNOSIS — D75839 Thrombocytosis, unspecified: Secondary | ICD-10-CM | POA: Insufficient documentation

## 2017-12-04 DIAGNOSIS — E875 Hyperkalemia: Secondary | ICD-10-CM

## 2017-12-04 DIAGNOSIS — Z7982 Long term (current) use of aspirin: Secondary | ICD-10-CM

## 2017-12-04 DIAGNOSIS — D72825 Bandemia: Secondary | ICD-10-CM

## 2017-12-04 DIAGNOSIS — Z122 Encounter for screening for malignant neoplasm of respiratory organs: Secondary | ICD-10-CM

## 2017-12-04 DIAGNOSIS — D473 Essential (hemorrhagic) thrombocythemia: Secondary | ICD-10-CM

## 2017-12-04 LAB — CBC WITH DIFFERENTIAL/PLATELET
BASOS PCT: 0 %
Basophils Absolute: 0.1 10*3/uL (ref 0.0–0.1)
EOS ABS: 0.1 10*3/uL (ref 0.0–0.7)
Eosinophils Relative: 1 %
HCT: 48.2 % — ABNORMAL HIGH (ref 36.0–46.0)
HEMOGLOBIN: 16.1 g/dL — AB (ref 12.0–15.0)
Lymphocytes Relative: 13 %
Lymphs Abs: 1.8 10*3/uL (ref 0.7–4.0)
MCH: 31 pg (ref 26.0–34.0)
MCHC: 33.4 g/dL (ref 30.0–36.0)
MCV: 92.7 fL (ref 78.0–100.0)
MONO ABS: 1.3 10*3/uL — AB (ref 0.1–1.0)
Monocytes Relative: 9 %
NEUTROS PCT: 77 %
Neutro Abs: 11 10*3/uL — ABNORMAL HIGH (ref 1.7–7.7)
PLATELETS: 778 10*3/uL — AB (ref 150–400)
RBC: 5.2 MIL/uL — ABNORMAL HIGH (ref 3.87–5.11)
RDW: 15.5 % (ref 11.5–15.5)
WBC: 14.2 10*3/uL — ABNORMAL HIGH (ref 4.0–10.5)

## 2017-12-04 LAB — COMPREHENSIVE METABOLIC PANEL
ALBUMIN: 4.6 g/dL (ref 3.5–5.0)
ALT: 16 U/L (ref 14–54)
ANION GAP: 13 (ref 5–15)
AST: 22 U/L (ref 15–41)
Alkaline Phosphatase: 91 U/L (ref 38–126)
BUN: 11 mg/dL (ref 6–20)
CHLORIDE: 102 mmol/L (ref 101–111)
CO2: 25 mmol/L (ref 22–32)
Calcium: 10 mg/dL (ref 8.9–10.3)
Creatinine, Ser: 0.69 mg/dL (ref 0.44–1.00)
GFR calc Af Amer: >60 mL/min (ref >60)
GFR calc non Af Amer: >60 mL/min (ref >60)
GLUCOSE: 94 mg/dL (ref 65–99)
POTASSIUM: 4.2 mmol/L (ref 3.5–5.1)
SODIUM: 140 mmol/L (ref 135–145)
Total Bilirubin: 0.6 mg/dL (ref 0.3–1.2)
Total Protein: 7.9 g/dL (ref 6.5–8.1)

## 2017-12-04 LAB — URINALYSIS, ROUTINE W REFLEX MICROSCOPIC
Bilirubin Urine: NEGATIVE
Glucose, UA: NEGATIVE mg/dL
Hgb urine dipstick: NEGATIVE
Ketones, ur: NEGATIVE mg/dL
LEUKOCYTES UA: NEGATIVE
Nitrite: NEGATIVE
PH: 6 (ref 5.0–8.0)
Protein, ur: NEGATIVE mg/dL
Specific Gravity, Urine: 1.002 — ABNORMAL LOW (ref 1.005–1.030)

## 2017-12-04 LAB — LACTATE DEHYDROGENASE: LDH: 264 U/L — ABNORMAL HIGH (ref 98–192)

## 2017-12-04 NOTE — Progress Notes (Signed)
[facility] CONSULT NOTE  Patient Care Team: Sharilyn Sites, MD as PCP - General (Family Medicine)  CHIEF COMPLAINTS/PURPOSE OF CONSULTATION:  Thrombocytosis, elevated hemoglobin/hematocrit, and leukocytosis    HISTORY OF PRESENTING ILLNESS:  Denise Maynard 62 y.o. female is here for initial consultation for thrombocytosis, elevated H&H, and leukocytosis.    Here today with her sister.   She was recently seen by Dr. Lowanda Foster with nephrology for hyperkalemia. K 6.3 on 12/01/14.  She does take a multivitamin with potassium; she was encouraged to stop this today.    Endorses occasional (L) 5th finger burning. Reports no itching after hot showers.  Denies h/o DVT.  Reports h/o aneurysm repair, which was complicated by "a stroke in my foot" that resulted in skin discoloration. No family h/o blood disorders that she is aware of. Denies fever, night sweats, or unintentional weight loss.  Denies any recent infections. Denies any recent CXR or other imaging studies.  She does take aspirin 81 mg daily.   Oncologic family history includes:  Father: throat cancer Grandfather: prostate cancer with bone mets   Lives with a friend. She is active; able to care for herself. 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   She became aware of her abnormal labs ~1 month ago. She had not had an annual physical for several years (since 2016).    Smokes ~1 ppd for about 40 years.  We will order low-dose CT chest screening for lung cancer today; risks/benefits of this imaging were discussed with patient today.  She agreed to proceed.   She donates blood regularly. Last donation was in 09/2017. Prior to that, her last donation was in 2016.     MEDICAL HISTORY:  Past Medical History:  Diagnosis Date  . Carotid artery aneurysm (HCC)    Coil embolization of a supraclinoid right internal carotid artery - Dr. Kathyrn Sheriff  . Hyperlipidemia   . Nicotine addiction 05/15/2015    Will try patch  . Serum calcium elevated 11/19/2017   recheck  . Serum potassium elevated 11/19/2017  . Stroke [facility])     SURGICAL HISTORY: Past Surgical History:  Procedure Laterality Date  . ABDOMINAL AORTAGRAM N/A 11/24/2014   Procedure: ABDOMINAL Maxcine Ham;  Surgeon: Elam Dutch, MD;  Location: [facility] CATH LAB;  Service: Cardiovascular;  Laterality: N/A;  . Aneursym repair  10/2014  . COLONOSCOPY N/A 07/30/2015   Procedure: COLONOSCOPY;  Surgeon: Danie Binder, MD;  Location: [facility] ENDO SUITE;  Service: Endoscopy;  Laterality: N/A;  1:00 Pm - moved to 1:30 - office to notify  . FRACTURE SURGERY     Jaw  . LOWER EXTREMITY ANGIOGRAM  11/24/2014   Procedure: LOWER EXTREMITY ANGIOGRAM;  Surgeon: Elam Dutch, MD;  Location: [facility] CATH LAB;  Service: Cardiovascular;;  . MOUTH SURGERY    . RADIOLOGY WITH ANESTHESIA N/A 10/12/2014   Procedure: Embolization/arteriogram;  Surgeon: Consuella Lose, MD;  Location: [facility];  Service: Radiology;  Laterality: N/A;    SOCIAL HISTORY: Social History   Socioeconomic History  . Marital status: Divorced    Spouse name: Not on file  . Number of children: Not on file  . Years of education: Not on file  . Highest education level: Not on file  Occupational History  . Not on file  Social Needs  . Financial resource strain: Not on file  . Food insecurity:    Worry: Not on file    Inability: Not on file  .  Transportation needs:    Medical: Not on file    Non-medical: Not on file  Tobacco Use  . Smoking status: Current Every Day Smoker    Packs/day: 0.50    Years: 30.00    Pack years: 15.00    Types: Cigarettes  . Smokeless tobacco: Never Used  Substance and Sexual Activity  . Alcohol use: No    Alcohol/week: 0.0 oz  . Drug use: No  . Sexual activity: Yes    Birth control/protection: Post-menopausal  Lifestyle  . Physical activity:    Days per week: Not on file    Minutes per session: Not on file  . Stress: Not on file  Relationships   . Social connections:    Talks on phone: Not on file    Gets together: Not on file    Attends religious service: Not on file    Active member of club or organization: Not on file    Attends meetings of clubs or organizations: Not on file    Relationship status: Not on file  . Intimate partner violence:    Fear of current or ex partner: Not on file    Emotionally abused: Not on file    Physically abused: Not on file    Forced sexual activity: Not on file  Other Topics Concern  . Not on file  Social History Narrative  . Not on file    FAMILY HISTORY: Family History  Problem Relation Age of Onset  . Heart attack Mother   . Diabetes Mother   . Throat cancer Father   . Hypertension Father   . Hyperlipidemia Sister   . Diabetes Maternal Grandmother   . Cancer Maternal Grandfather   . Heart attack Paternal Grandfather   . Hyperlipidemia Sister     ALLERGIES:  has No Known Allergies.  MEDICATIONS:  Current Outpatient Medications  Medication Sig Dispense Refill  . acetaminophen (TYLENOL) 500 MG tablet Take 1,000 mg by mouth every 6 (six) hours as needed for mild pain or moderate pain.     Marland Kitchen aspirin EC 81 MG tablet Take 81 mg by mouth daily.    . cholecalciferol (VITAMIN D) 1000 UNITS tablet Take 1,000 Units by mouth daily.    . Multiple Vitamins-Minerals (MULTIVITAMIN WITH MINERALS) tablet Take 1 tablet by mouth daily.    . Omega-3 Fatty Acids (FISH OIL) 1200 MG CAPS Take 1,200 mg by mouth 2 (two) times daily.     No current facility-administered medications for this visit.     REVIEW OF SYSTEMS:   Constitutional: Denies fevers, chills or abnormal night sweats Eyes: Denies blurriness of vision, double vision or watery eyes Ears, nose, mouth, throat, and face: Denies mucositis or sore throat Respiratory: Denies cough, dyspnea or wheezes Cardiovascular: Denies palpitation, chest discomfort or lower extremity swelling Gastrointestinal:  Denies nausea, heartburn or change in  bowel habits Skin: Denies abnormal skin rashes Lymphatics: Denies new lymphadenopathy or easy bruising Neurological:Denies numbness, tingling or new weaknesses Behavioral/Psych: Mood is stable, no new changes  All other systems were reviewed with the patient and are negative.  PHYSICAL EXAMINATION: ECOG PERFORMANCE STATUS: 1 - Symptomatic but completely ambulatory  Vitals:   12/04/17 0835  BP: (!) 141/68  Pulse: 73  Resp: 18  Temp: 98.1 F (36.7 C)  SpO2: 98%   Filed Weights   12/04/17 0835  Weight: 142 lb 11.2 oz (64.7 kg)    GENERAL:alert, no distress and comfortable SKIN: skin color, texture, turgor are normal, no rashes  or significant lesions EYES: normal, conjunctiva are pink and non-injected, sclera clear OROPHARYNX:no exudate, no erythema and lips, buccal mucosa, and tongue normal  NECK: supple, thyroid normal size, non-tender, without nodularity LYMPH:  no palpable lymphadenopathy in the cervical, axillary or inguinal LUNGS: clear to auscultation and percussion with normal breathing effort HEART: regular rate & rhythm and no murmurs and no lower extremity edema ABDOMEN:abdomen soft, non-tender and normal bowel sounds Musculoskeletal:no cyanosis of digits and no clubbing  PSYCH: alert & oriented x 3 with fluent speech NEURO: no focal motor/sensory deficits  LABORATORY DATA:  I have reviewed the data as listed Recent Results (from the past 2160 hour(s))  Cytology - PAP     Status: None   Collection Time: 11/17/17 12:00 AM  Result Value Ref Range   Adequacy      Satisfactory for evaluation. The presence or absence of an endocervical / transformation zone component cannot be determined because of atrophy.   Diagnosis      NEGATIVE FOR INTRAEPITHELIAL LESIONS OR MALIGNANCY.   HPV NOT DETECTED     Comment: Normal Reference Range - NOT Detected   Material Submitted CervicoVaginal Pap [ThinPrep Imaged]    CYTOLOGY - PAP PAP RESULT   POCT occult blood stool      Status: None   Collection Time: 11/17/17 10:01 AM  Result Value Ref Range   Fecal Occult Blood, POC Negative Negative   Card #1 Date     Card #2 Fecal Occult Blod, POC     Card #2 Date     Card #3 Fecal Occult Blood, POC     Card #3 Date    CBC     Status: Abnormal   Collection Time: 11/17/17 10:13 AM  Result Value Ref Range   WBC 11.9 (H) 3.4 - 10.8 x10E3/uL   RBC 5.32 (H) 3.77 - 5.28 x10E6/uL   Hemoglobin 16.5 (H) 11.1 - 15.9 g/dL   Hematocrit 50.8 (H) 34.0 - 46.6 %   MCV 96 79 - 97 fL   MCH 31.0 26.6 - 33.0 pg   MCHC 32.5 31.5 - 35.7 g/dL   RDW 15.5 (H) 12.3 - 15.4 %   Platelets 781 (H) 150 - 379 x10E3/uL  Comprehensive metabolic panel     Status: Abnormal   Collection Time: 11/17/17 10:13 AM  Result Value Ref Range   Glucose 82 65 - 99 mg/dL   BUN 11 8 - 27 mg/dL   Creatinine, Ser 0.74 0.57 - 1.00 mg/dL   GFR calc non Af Amer 86 >59 mL/min/1.73   GFR calc Af Amer 100 >59 mL/min/1.73   BUN/Creatinine Ratio 15 12 - 28   Sodium 143 134 - 144 mmol/L   Potassium 6.2 (H) 3.5 - 5.2 mmol/L   Chloride 102 96 - 106 mmol/L   CO2 25 20 - 29 mmol/L   Calcium 10.9 (H) 8.7 - 10.3 mg/dL   Total Protein 7.4 6.0 - 8.5 g/dL   Albumin 4.8 3.6 - 4.8 g/dL   Globulin, Total 2.6 1.5 - 4.5 g/dL   Albumin/Globulin Ratio 1.8 1.2 - 2.2   Bilirubin Total 0.4 0.0 - 1.2 mg/dL   Alkaline Phosphatase 106 39 - 117 IU/L   AST 19 0 - 40 IU/L   ALT 19 0 - 32 IU/L  TSH     Status: None   Collection Time: 11/17/17 10:13 AM  Result Value Ref Range   TSH 1.330 0.450 - 4.500 uIU/mL  Lipid panel  Status: Abnormal   Collection Time: 11/17/17 10:13 AM  Result Value Ref Range   Cholesterol, Total 249 (H) 100 - 199 mg/dL   Triglycerides 181 (H) 0 - 149 mg/dL   HDL 62 >39 mg/dL   VLDL Cholesterol Cal 36 5 - 40 mg/dL   LDL Calculated 151 (H) 0 - 99 mg/dL   Chol/HDL Ratio 4.0 0.0 - 4.4 ratio    Comment:                                   T. Chol/HDL Ratio                                             Men   Women                               1/2 Avg.Risk  3.4    3.3                                   Avg.Risk  5.0    4.4                                2X Avg.Risk  9.6    7.1                                3X Avg.Risk 23.4   11.0   CBC     Status: Abnormal   Collection Time: 11/20/17 10:51 AM  Result Value Ref Range   WBC 12.7 (H) 3.4 - 10.8 x10E3/uL   RBC 5.09 3.77 - 5.28 x10E6/uL   Hemoglobin 16.2 (H) 11.1 - 15.9 g/dL   Hematocrit 47.9 (H) 34.0 - 46.6 %   MCV 94 79 - 97 fL   MCH 31.8 26.6 - 33.0 pg   MCHC 33.8 31.5 - 35.7 g/dL   RDW 15.4 12.3 - 15.4 %   Platelets 767 (H) 150 - 379 x10E3/uL  Comprehensive metabolic panel     Status: Abnormal   Collection Time: 11/20/17 10:51 AM  Result Value Ref Range   Glucose 81 65 - 99 mg/dL   BUN 8 8 - 27 mg/dL   Creatinine, Ser 0.63 0.57 - 1.00 mg/dL   GFR calc non Af Amer 96 >59 mL/min/1.73   GFR calc Af Amer 110 >59 mL/min/1.73   BUN/Creatinine Ratio 13 12 - 28   Sodium 140 134 - 144 mmol/L   Potassium 5.9 (H) 3.5 - 5.2 mmol/L   Chloride 101 96 - 106 mmol/L   CO2 24 20 - 29 mmol/L   Calcium 10.4 (H) 8.7 - 10.3 mg/dL   Total Protein 7.3 6.0 - 8.5 g/dL   Albumin 4.5 3.6 - 4.8 g/dL   Globulin, Total 2.8 1.5 - 4.5 g/dL   Albumin/Globulin Ratio 1.6 1.2 - 2.2   Bilirubin Total 0.2 0.0 - 1.2 mg/dL   Alkaline Phosphatase 96 39 - 117 IU/L   AST 20 0 - 40 IU/L   ALT 16 0 - 32 IU/L  Magnesium     Status:  None   Collection Time: 11/20/17 10:51 AM  Result Value Ref Range   Magnesium 2.2 1.6 - 2.3 mg/dL  Phosphorus     Status: None   Collection Time: 11/20/17 10:51 AM  Result Value Ref Range   Phosphorus 3.8 2.5 - 4.5 mg/dL  VITAMIN D 25 Hydroxy (Vit-D Deficiency, Fractures)     Status: None   Collection Time: 11/20/17 10:51 AM  Result Value Ref Range   Vit D, 25-Hydroxy 35.1 30.0 - 100.0 ng/mL    Comment: Vitamin D deficiency has been defined by the [place] and an Endocrine Society practice guideline as a level of serum  25-OH vitamin D less than 20 ng/mL (1,2). The Endocrine Society went on to further define vitamin D insufficiency as a level between 21 and 29 ng/mL (2). 1. IOM (Institute of Medicine). 2010. Dietary reference    intakes for calcium and D. [place]: The    [employer]. 2. Holick MF, Binkley [place], Bischoff-Ferrari HA, et al.    Evaluation, treatment, and prevention of vitamin D    deficiency: an Endocrine Society clinical practice    guideline. JCEM. 2011 Jul; 96(7):1911-30.   PTH, Intact and Calcium     Status: None   Collection Time: 11/20/17 10:51 AM  Result Value Ref Range   PTH 24 15 - 65 pg/mL   PTH Interp Comment     Comment: Interpretation                 Intact PTH    Calcium                                 (pg/mL)      (mg/dL) Normal                          15 - 65     8.6 - 10.2 Primary Hyperparathyroidism         >65          >10.2 Secondary Hyperparathyroidism       >65          <10.2 Non-Parathyroid Hypercalcemia       <65          >10.2 Hypoparathyroidism                  <15          < 8.6 Non-Parathyroid Hypocalcemia    15 - 65          < 8.6   CBC w/Diff     Status: Abnormal   Collection Time: 11/30/17  8:41 AM  Result Value Ref Range   WBC 13.1 (H) 3.4 - 10.8 x10E3/uL   RBC 5.43 (H) 3.77 - 5.28 x10E6/uL   Hemoglobin 17.2 (H) 11.1 - 15.9 g/dL   Hematocrit 51.4 (H) 34.0 - 46.6 %   MCV 95 79 - 97 fL   MCH 31.7 26.6 - 33.0 pg   MCHC 33.5 31.5 - 35.7 g/dL   RDW 15.7 (H) 12.3 - 15.4 %   Platelets 900 (HH) 150 - 379 x10E3/uL   Neutrophils 69 Not Estab. %   Lymphs 17 Not Estab. %   Monocytes 11 Not Estab. %   Eos 1 Not Estab. %   Basos 1 Not Estab. %   Neutrophils Absolute 9.2 (H) 1.4 - 7.0 x10E3/uL   Lymphocytes Absolute 2.2  0.7 - 3.1 x10E3/uL   Monocytes Absolute 1.4 (H) 0.1 - 0.9 x10E3/uL   EOS (ABSOLUTE) 0.2 0.0 - 0.4 x10E3/uL   Basophils Absolute 0.1 0.0 - 0.2 x10E3/uL   Immature Granulocytes 1 Not Estab. %   Immature Grans (Abs) 0.1 0.0  - 0.1 x10E3/uL  Comprehensive metabolic panel     Status: Abnormal   Collection Time: 11/30/17  8:41 AM  Result Value Ref Range   Glucose 65 65 - 99 mg/dL    Comment: **Verified by repeat analysis** Specimen received in contact with cells. No visible hemolysis present. However GLUC may be decreased and K increased. Clinical correlation indicated.    BUN 7 (L) 8 - 27 mg/dL   Creatinine, Ser 0.67 0.57 - 1.00 mg/dL   GFR calc non Af Amer 94 >59 mL/min/1.73   GFR calc Af Amer 108 >59 mL/min/1.73   BUN/Creatinine Ratio 10 (L) 12 - 28   Sodium 143 134 - 144 mmol/L   Potassium 6.3 (H) 3.5 - 5.2 mmol/L   Chloride 102 96 - 106 mmol/L   CO2 23 20 - 29 mmol/L   Calcium 10.7 (H) 8.7 - 10.3 mg/dL   Total Protein 7.7 6.0 - 8.5 g/dL   Albumin 4.8 3.6 - 4.8 g/dL   Globulin, Total 2.9 1.5 - 4.5 g/dL   Albumin/Globulin Ratio 1.7 1.2 - 2.2   Bilirubin Total 0.3 0.0 - 1.2 mg/dL   Alkaline Phosphatase 102 39 - 117 IU/L   AST 20 0 - 40 IU/L   ALT 16 0 - 32 IU/L    RADIOGRAPHIC STUDIES: I have personally reviewed the radiological images as listed and agreed with the findings in the report. Mm Digital Screening Bilateral  Result Date: 11/27/2017 CLINICAL DATA:  Screening. EXAM: DIGITAL SCREENING BILATERAL MAMMOGRAM WITH CAD COMPARISON:  Previous exam(s). ACR Breast Density Category c: The breast tissue is heterogeneously dense, which may obscure small masses. FINDINGS: There are no findings suspicious for malignancy. Images were processed with CAD. IMPRESSION: No mammographic evidence of malignancy. A result letter of this screening mammogram will be mailed directly to the patient. RECOMMENDATION: Screening mammogram in one year. (Code:SM-B-01Y) BI-RADS CATEGORY  1: Negative. Electronically Signed   By: Ammie Ferrier M.D.   On: 11/27/2017 16:56    ASSESSMENT & PLAN:  Thrombocytosis ([place]) 62 year old white female, 40-pack-year smoking history, seen for evaluation of thrombocytosis, erythrocytosis  and leukocytosis.  1.  Thrombocytosis: - She was noted to have mild thrombocytosis since 2016.  However it has gone up significantly in the last few weeks.  The last platelet count was 900.  She is taking baby aspirin daily.  She does not have erythromelalgia or vasomotor symptoms.  She does not report any aquagenic pruritus.  She denies any history of deep vein thrombosis.  She reports history of stroke to the right foot after coil embolization of the supraclinoid right ICA for an aneurysm.  We will do testing for myeloproliferative disorders including essential thrombocytosis, polycythemia vera and chronic myeloid leukemia.  We will see her back in 10 to 14 days to discuss the results.  2.  Elevated hemoglobin: -This could be due to primary cause a slight polycythemia versus secondary erythrocytosis from smoking.  We have ordered Jak 2 V617F testing.  3.  Elevated white count: - Again this could be part of spectrum of myeloproliferative disorder.  4.  40-pack-year smoking history: -We will order low-dose CT scan for lung cancer screening.  5.  Hyperkalemia: - This could be  pseudohyperkalemia from elevated platelet count.  I have told her to discontinue multivitamin which has potassium supplements.  6.  Mild hypercalcemia: - Her intact PTH was normal.  Dr. Lowanda Foster is doing the work-up.     All questions were answered. The patient knows to call the clinic with any problems, questions or concerns.    This note includes documentation from Mike Craze, NP, who was present during this patient's office visit and evaluation.  I have reviewed this note for its completeness and accuracy.  I have edited this note accordingly based on my findings and medical opinion.      Derek Jack, MD  12/04/17 9:20 AM

## 2017-12-04 NOTE — Patient Instructions (Signed)
[facility] Discharge Instructions  Today you saw Dr. K.   Thank you for choosing [facility] at [facility] to provide your oncology and hematology care.  To afford each patient quality time with our provider, please arrive at least 15 minutes before your scheduled appointment time.   If you have a lab appointment with the Cancer Center please come in thru the  Main Entrance and check in at the main information desk  You need to re-schedule your appointment should you arrive 10 or more minutes late.  We strive to give you quality time with our providers, and arriving late affects you and other patients whose appointments are after yours.  Also, if you no show three or more times for appointments you may be dismissed from the clinic at the providers discretion.     Again, thank you for choosing [facility].  Our hope is that these requests will decrease the amount of time that you wait before being seen by our physicians.       _____________________________________________________________  Should you have questions after your visit to [facility], please contact our office at [phone] between the hours of 8:30 a.m. and 4:30 p.m.  Voicemails left after 4:30 p.m. will not be returned until the following business day.  For prescription refill requests, have your pharmacy contact our office.       Resources For Cancer Patients and their Caregivers ? American Cancer Society: Can assist with transportation, wigs, general needs, runs Look Good Feel Better.        1-888-227-6333 ? Cancer Care: Provides financial assistance, online support groups, medication/co-pay assistance.  1-800-813-HOPE (4673) ? [facility] Assists [place] cancer patients and their families through emotional , educational and financial support.  336-427-4357 ? [place] DSS Where to apply for food  stamps, Medicaid and utility assistance. 336-342-1394 ? [facility]: Transportation to medical appointments. 336-347-2287 ? Social Security Administration: May apply for disability if have a Stage IV cancer. 336-342-7796 1-800-772-1213 ? [place] Aging, Disability and Transit Services: Assists with nutrition, care and transit needs. 336-349-2343  Cancer Center Support Programs:   > Cancer Support Group  2nd Tuesday of the month 1pm-2pm, Journey Room   > Creative Journey  3rd Tuesday of the month 1130am-1pm, Journey Room    

## 2017-12-04 NOTE — Assessment & Plan Note (Addendum)
62 year old white female, 40-pack-year smoking history, seen for evaluation of thrombocytosis, erythrocytosis and leukocytosis.  1.  Thrombocytosis: - She was noted to have mild thrombocytosis since 2016.  However it has gone up significantly in the last few weeks.  The last platelet count was 900.  She is taking baby aspirin daily.  She does not have erythromelalgia or vasomotor symptoms.  She does not report any aquagenic pruritus.  She denies any history of deep vein thrombosis.  She reports history of stroke to the right foot after coil embolization of the supraclinoid right ICA for an aneurysm.  We will do testing for myeloproliferative disorders including essential thrombocytosis, polycythemia vera and chronic myeloid leukemia.  We will see her back in 10 to 14 days to discuss the results.  2.  Elevated hemoglobin: -This could be due to primary cause a slight polycythemia versus secondary erythrocytosis from smoking.  We have ordered Jak 2 V617F testing.  3.  Elevated white count: - Again this could be part of spectrum of myeloproliferative disorder.  4.  40-pack-year smoking history: -We will order low-dose CT scan for lung cancer screening.  5.  Hyperkalemia: - This could be pseudohyperkalemia from elevated platelet count.  I have told her to discontinue multivitamin which has potassium supplements.  6.  Mild hypercalcemia: - Her intact PTH was normal.  Dr. Lowanda Foster is doing the work-up.

## 2017-12-04 NOTE — Addendum Note (Signed)
Addended by: Jaynie Collins R on: 12/04/2017 10:01 AM   Modules accepted: Orders

## 2017-12-05 LAB — ERYTHROPOIETIN: Erythropoietin: 2 m[IU]/mL — ABNORMAL LOW (ref 2.6–18.5)

## 2017-12-08 ENCOUNTER — Encounter (INDEPENDENT_AMBULATORY_CARE_PROVIDER_SITE_OTHER): Payer: BLUE CROSS/BLUE SHIELD | Admitting: Ophthalmology

## 2017-12-08 DIAGNOSIS — H2513 Age-related nuclear cataract, bilateral: Secondary | ICD-10-CM

## 2017-12-08 DIAGNOSIS — H43813 Vitreous degeneration, bilateral: Secondary | ICD-10-CM | POA: Diagnosis not present

## 2017-12-08 DIAGNOSIS — H538 Other visual disturbances: Secondary | ICD-10-CM

## 2017-12-08 LAB — BCR-ABL1, CML/ALL, PCR, QUANT

## 2017-12-09 LAB — JAK2 V617F, W REFLEX TO CALR/E12/MPL

## 2017-12-22 ENCOUNTER — Ambulatory Visit (HOSPITAL_COMMUNITY)
Admission: RE | Admit: 2017-12-22 | Discharge: 2017-12-22 | Disposition: A | Discharge status: Home / Self Care | Payer: BLUE CROSS/BLUE SHIELD | Source: Ambulatory Visit | Attending: Adult Health | Admitting: Adult Health

## 2017-12-22 DIAGNOSIS — R918 Other nonspecific abnormal finding of lung field: Secondary | ICD-10-CM | POA: Diagnosis not present

## 2017-12-22 DIAGNOSIS — F17200 Nicotine dependence, unspecified, uncomplicated: Secondary | ICD-10-CM | POA: Diagnosis present

## 2017-12-22 DIAGNOSIS — J432 Centrilobular emphysema: Secondary | ICD-10-CM | POA: Insufficient documentation

## 2017-12-22 DIAGNOSIS — J438 Other emphysema: Secondary | ICD-10-CM | POA: Insufficient documentation

## 2017-12-22 DIAGNOSIS — I2510 Atherosclerotic heart disease of native coronary artery without angina pectoris: Secondary | ICD-10-CM | POA: Insufficient documentation

## 2017-12-22 DIAGNOSIS — F17210 Nicotine dependence, cigarettes, uncomplicated: Secondary | ICD-10-CM | POA: Insufficient documentation

## 2017-12-22 DIAGNOSIS — Z122 Encounter for screening for malignant neoplasm of respiratory organs: Secondary | ICD-10-CM | POA: Insufficient documentation

## 2017-12-22 DIAGNOSIS — I700 Atherosclerosis of aorta: Secondary | ICD-10-CM | POA: Insufficient documentation

## 2017-12-25 ENCOUNTER — Other Ambulatory Visit: Payer: Self-pay

## 2017-12-25 ENCOUNTER — Encounter (HOSPITAL_COMMUNITY): Payer: Self-pay | Admitting: Hematology

## 2017-12-25 ENCOUNTER — Inpatient Hospital Stay (HOSPITAL_COMMUNITY): Payer: BLUE CROSS/BLUE SHIELD | Attending: Hematology | Admitting: Hematology

## 2017-12-25 VITALS — BP 152/71 | HR 60 | Temp 98.2°F | Resp 18 | Ht 65.0 in | Wt 142.7 lb

## 2017-12-25 DIAGNOSIS — D75839 Thrombocytosis, unspecified: Secondary | ICD-10-CM

## 2017-12-25 DIAGNOSIS — D751 Secondary polycythemia: Secondary | ICD-10-CM

## 2017-12-25 DIAGNOSIS — D72829 Elevated white blood cell count, unspecified: Secondary | ICD-10-CM | POA: Diagnosis not present

## 2017-12-25 DIAGNOSIS — Z1589 Genetic susceptibility to other disease: Secondary | ICD-10-CM

## 2017-12-25 DIAGNOSIS — D45 Polycythemia vera: Secondary | ICD-10-CM | POA: Insufficient documentation

## 2017-12-25 DIAGNOSIS — D473 Essential (hemorrhagic) thrombocythemia: Secondary | ICD-10-CM | POA: Insufficient documentation

## 2017-12-25 DIAGNOSIS — G4709 Other insomnia: Secondary | ICD-10-CM

## 2017-12-25 DIAGNOSIS — D471 Chronic myeloproliferative disease: Secondary | ICD-10-CM | POA: Insufficient documentation

## 2017-12-25 MED ORDER — HYDROXYUREA 500 MG PO CAPS: 500.0000 mg | ORAL_CAPSULE | Freq: Every day | ORAL | 1 refills | Status: DC

## 2017-12-25 MED ORDER — ALPRAZOLAM 0.25 MG PO TABS: 0.2500 mg | ORAL_TABLET | Freq: Every evening | ORAL | 1 refills | Status: DC | PRN

## 2017-12-25 NOTE — Progress Notes (Signed)
[name] FOLLOW-UP NOTE  Patient Care Team: Sharilyn Sites, MD as PCP - General (Family Medicine)  CHIEF COMPLAINTS/PURPOSE OF CONSULTATION:  Thrombocytosis, elevated hemoglobin/hematocrit, and leukocytosis    HISTORY OF PRESENTING ILLNESS:  Denise Maynard 62 y.o. female is here for follow-up for thrombocytosis, elevated H&H, and leukocytosis.    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   Denies any fevers, night sweats or weight loss.  Denies any vasomotor symptoms.  No aquagenic pruritus.  Complains of inability to sleep.  Denies any headaches or vision changes.  Continues to smoke 1 pack/day for the past 40 years.  She is under a lot of stress as her friend is undergoing chemotherapy for terminal cancer.  No family history of myeloproliferative disorders.      MEDICAL HISTORY:  Past Medical History:  Diagnosis Date  . Carotid artery aneurysm (HCC)    Coil embolization of a supraclinoid right internal carotid artery - Dr. Kathyrn Sheriff  . Hyperlipidemia   . Nicotine addiction 05/15/2015   Will try patch  . Serum calcium elevated 11/19/2017   recheck  . Serum potassium elevated 11/19/2017  . Stroke [facility])     SURGICAL HISTORY: Past Surgical History:  Procedure Laterality Date  . ABDOMINAL AORTAGRAM N/A 11/24/2014   Procedure: ABDOMINAL Maxcine Ham;  Surgeon: Elam Dutch, MD;  Location: [facility] CATH LAB;  Service: Cardiovascular;  Laterality: N/A;  . Aneursym repair  10/2014  . COLONOSCOPY N/A 07/30/2015   Procedure: COLONOSCOPY;  Surgeon: Danie Binder, MD;  Location: [facility] ENDO SUITE;  Service: Endoscopy;  Laterality: N/A;  1:00 Pm - moved to 1:30 - office to notify  . FRACTURE SURGERY     Jaw  . LOWER EXTREMITY ANGIOGRAM  11/24/2014   Procedure: LOWER EXTREMITY ANGIOGRAM;  Surgeon: Elam Dutch, MD;  Location: [facility] CATH LAB;  Service: Cardiovascular;;  . MOUTH SURGERY    . RADIOLOGY WITH ANESTHESIA N/A 10/12/2014   Procedure: Embolization/arteriogram;  Surgeon: Consuella Lose, MD;  Location: [facility];  Service: Radiology;  Laterality: N/A;    SOCIAL HISTORY: Social History   Socioeconomic History  . Marital status: Divorced    Spouse name: Not on file  . Number of children: Not on file  . Years of education: Not on file  . Highest education level: Not on file  Occupational History  . Not on file  Social Needs  . Financial resource strain: Not on file  . Food insecurity:    Worry: Not on file    Inability: Not on file  . Transportation needs:    Medical: Not on file    Non-medical: Not on file  Tobacco Use  . Smoking status: Current Every Day Smoker    Packs/day: 0.50    Years: 30.00    Pack years: 15.00    Types: Cigarettes  . Smokeless tobacco: Never Used  Substance and Sexual Activity  . Alcohol use: No    Alcohol/week: 0.0 oz  . Drug use: No  . Sexual activity: Yes    Birth control/protection: Post-menopausal  Lifestyle  . Physical activity:    Days per week: Not on file    Minutes per session: Not on file  . Stress: Not on file  Relationships  . Social connections:    Talks on phone: Not on file    Gets together: Not on file    Attends religious service: Not on file    Active member of club or organization: Not on file  Attends meetings of clubs or organizations: Not on file    Relationship status: Not on file  . Intimate partner violence:    Fear of current or ex partner: Not on file    Emotionally abused: Not on file    Physically abused: Not on file    Forced sexual activity: Not on file  Other Topics Concern  . Not on file  Social History Narrative  . Not on file    FAMILY HISTORY: Family History  Problem Relation Age of Onset  . Heart attack Mother   . Diabetes Mother   . Throat cancer Father   . Hypertension Father   . Hyperlipidemia Sister   . Diabetes Maternal Grandmother   . Cancer Maternal Grandfather   . Heart attack Paternal Grandfather   . Hyperlipidemia Sister     ALLERGIES:  has No Known  Allergies.  MEDICATIONS:  Current Outpatient Medications  Medication Sig Dispense Refill  . acetaminophen (TYLENOL) 500 MG tablet Take 1,000 mg by mouth every 6 (six) hours as needed for mild pain or moderate pain.     Marland Kitchen aspirin EC 81 MG tablet Take 81 mg by mouth daily.    . cholecalciferol (VITAMIN D) 1000 UNITS tablet Take 1,000 Units by mouth daily.    . Multiple Vitamins-Minerals (MULTIVITAMIN WITH MINERALS) tablet Take 1 tablet by mouth daily.    . Omega-3 Fatty Acids (FISH OIL) 1200 MG CAPS Take 1,200 mg by mouth 2 (two) times daily.    Marland Kitchen ALPRAZolam (XANAX) 0.25 MG tablet Take 1 tablet (0.25 mg total) by mouth at bedtime as needed for anxiety. 30 tablet 1  . hydroxyurea (HYDREA) 500 MG capsule Take 1 capsule (500 mg total) by mouth daily. May take with food to minimize GI side effects. 30 capsule 1   No current facility-administered medications for this visit.     REVIEW OF SYSTEMS:   Constitutional: Denies fevers, chills or abnormal night sweats.  Complains of problems sleeping. Eyes: Denies blurriness of vision, double vision or watery eyes Ears, nose, mouth, throat, and face: Denies mucositis or sore throat Respiratory: Denies cough, dyspnea or wheezes Cardiovascular: Denies palpitation, chest discomfort or lower extremity swelling Gastrointestinal:  Denies nausea, heartburn or change in bowel habits Skin: Denies abnormal skin rashes Lymphatics: Denies new lymphadenopathy or easy bruising Neurological:Denies numbness, tingling or new weaknesses Behavioral/Psych: Mood is stable, no new changes  All other systems were reviewed with the patient and are negative.  PHYSICAL EXAMINATION: ECOG PERFORMANCE STATUS: 1 - Symptomatic but completely ambulatory  Vitals:   12/25/17 0946  BP: (!) 152/71  Pulse: 60  Resp: 18  Temp: 98.2 F (36.8 C)  SpO2: 99%   Filed Weights   12/25/17 0946  Weight: 142 lb 11.2 oz (64.7 kg)    GENERAL:alert, no distress and comfortable SKIN:  skin color, texture, turgor are normal, no rashes or significant lesions EYES: normal, conjunctiva are pink and non-injected, sclera clear   LABORATORY DATA:  I have reviewed the data as listed Recent Results (from the past 2160 hour(s))  Cytology - PAP     Status: None   Collection Time: 11/17/17 12:00 AM  Result Value Ref Range   Adequacy      Satisfactory for evaluation. The presence or absence of an endocervical / transformation zone component cannot be determined because of atrophy.   Diagnosis      NEGATIVE FOR INTRAEPITHELIAL LESIONS OR MALIGNANCY.   HPV NOT DETECTED     Comment: Normal  Reference Range - NOT Detected   Material Submitted CervicoVaginal Pap [ThinPrep Imaged]    CYTOLOGY - PAP PAP RESULT   POCT occult blood stool     Status: None   Collection Time: 11/17/17 10:01 AM  Result Value Ref Range   Fecal Occult Blood, POC Negative Negative   Card #1 Date     Card #2 Fecal Occult Blod, POC     Card #2 Date     Card #3 Fecal Occult Blood, POC     Card #3 Date    CBC     Status: Abnormal   Collection Time: 11/17/17 10:13 AM  Result Value Ref Range   WBC 11.9 (H) 3.4 - 10.8 x10E3/uL   RBC 5.32 (H) 3.77 - 5.28 x10E6/uL   Hemoglobin 16.5 (H) 11.1 - 15.9 g/dL   Hematocrit 50.8 (H) 34.0 - 46.6 %   MCV 96 79 - 97 fL   MCH 31.0 26.6 - 33.0 pg   MCHC 32.5 31.5 - 35.7 g/dL   RDW 15.5 (H) 12.3 - 15.4 %   Platelets 781 (H) 150 - 379 x10E3/uL  Comprehensive metabolic panel     Status: Abnormal   Collection Time: 11/17/17 10:13 AM  Result Value Ref Range   Glucose 82 65 - 99 mg/dL   BUN 11 8 - 27 mg/dL   Creatinine, Ser 0.74 0.57 - 1.00 mg/dL   GFR calc non Af Amer 86 >59 mL/min/1.73   GFR calc Af Amer 100 >59 mL/min/1.73   BUN/Creatinine Ratio 15 12 - 28   Sodium 143 134 - 144 mmol/L   Potassium 6.2 (H) 3.5 - 5.2 mmol/L   Chloride 102 96 - 106 mmol/L   CO2 25 20 - 29 mmol/L   Calcium 10.9 (H) 8.7 - 10.3 mg/dL   Total Protein 7.4 6.0 - 8.5 g/dL   Albumin 4.8 3.6 -  4.8 g/dL   Globulin, Total 2.6 1.5 - 4.5 g/dL   Albumin/Globulin Ratio 1.8 1.2 - 2.2   Bilirubin Total 0.4 0.0 - 1.2 mg/dL   Alkaline Phosphatase 106 39 - 117 IU/L   AST 19 0 - 40 IU/L   ALT 19 0 - 32 IU/L  TSH     Status: None   Collection Time: 11/17/17 10:13 AM  Result Value Ref Range   TSH 1.330 0.450 - 4.500 uIU/mL  Lipid panel     Status: Abnormal   Collection Time: 11/17/17 10:13 AM  Result Value Ref Range   Cholesterol, Total 249 (H) 100 - 199 mg/dL   Triglycerides 181 (H) 0 - 149 mg/dL   HDL 62 >39 mg/dL   VLDL Cholesterol Cal 36 5 - 40 mg/dL   LDL Calculated 151 (H) 0 - 99 mg/dL   Chol/HDL Ratio 4.0 0.0 - 4.4 ratio    Comment:                                   T. Chol/HDL Ratio                                             Men  Women                               1/2 Avg.Risk  3.4    3.3                                   Avg.Risk  5.0    4.4                                2X Avg.Risk  9.6    7.1                                3X Avg.Risk 23.4   11.0   CBC     Status: Abnormal   Collection Time: 11/20/17 10:51 AM  Result Value Ref Range   WBC 12.7 (H) 3.4 - 10.8 x10E3/uL   RBC 5.09 3.77 - 5.28 x10E6/uL   Hemoglobin 16.2 (H) 11.1 - 15.9 g/dL   Hematocrit 47.9 (H) 34.0 - 46.6 %   MCV 94 79 - 97 fL   MCH 31.8 26.6 - 33.0 pg   MCHC 33.8 31.5 - 35.7 g/dL   RDW 15.4 12.3 - 15.4 %   Platelets 767 (H) 150 - 379 x10E3/uL  Comprehensive metabolic panel     Status: Abnormal   Collection Time: 11/20/17 10:51 AM  Result Value Ref Range   Glucose 81 65 - 99 mg/dL   BUN 8 8 - 27 mg/dL   Creatinine, Ser 0.63 0.57 - 1.00 mg/dL   GFR calc non Af Amer 96 >59 mL/min/1.73   GFR calc Af Amer 110 >59 mL/min/1.73   BUN/Creatinine Ratio 13 12 - 28   Sodium 140 134 - 144 mmol/L   Potassium 5.9 (H) 3.5 - 5.2 mmol/L   Chloride 101 96 - 106 mmol/L   CO2 24 20 - 29 mmol/L   Calcium 10.4 (H) 8.7 - 10.3 mg/dL   Total Protein 7.3 6.0 - 8.5 g/dL   Albumin 4.5 3.6 - 4.8 g/dL   Globulin,  Total 2.8 1.5 - 4.5 g/dL   Albumin/Globulin Ratio 1.6 1.2 - 2.2   Bilirubin Total 0.2 0.0 - 1.2 mg/dL   Alkaline Phosphatase 96 39 - 117 IU/L   AST 20 0 - 40 IU/L   ALT 16 0 - 32 IU/L  Magnesium     Status: None   Collection Time: 11/20/17 10:51 AM  Result Value Ref Range   Magnesium 2.2 1.6 - 2.3 mg/dL  Phosphorus     Status: None   Collection Time: 11/20/17 10:51 AM  Result Value Ref Range   Phosphorus 3.8 2.5 - 4.5 mg/dL  VITAMIN D 25 Hydroxy (Vit-D Deficiency, Fractures)     Status: None   Collection Time: 11/20/17 10:51 AM  Result Value Ref Range   Vit D, 25-Hydroxy 35.1 30.0 - 100.0 ng/mL    Comment: Vitamin D deficiency has been defined by the Institute of Medicine and an Endocrine Society practice guideline as a level of serum 25-OH vitamin D less than 20 ng/mL (1,2). The Endocrine Society went on to further define vitamin D insufficiency as a level between 21 and 29 ng/mL (2). 1. IOM (Institute of Medicine). 2010. Dietary reference    intakes for calcium and D. [place]: The    [employer]. 2. Holick MF, Binkley Foard, Bischoff-Ferrari HA, et al.    Evaluation, treatment, and prevention of vitamin D    deficiency: an Endocrine  Society clinical practice    guideline. JCEM. 2011 Jul; 96(7):1911-30.   PTH, Intact and Calcium     Status: None   Collection Time: 11/20/17 10:51 AM  Result Value Ref Range   PTH 24 15 - 65 pg/mL   PTH Interp Comment     Comment: Interpretation                 Intact PTH    Calcium                                 (pg/mL)      (mg/dL) Normal                          15 - 65     8.6 - 10.2 Primary Hyperparathyroidism         >65          >10.2 Secondary Hyperparathyroidism       >65          <10.2 Non-Parathyroid Hypercalcemia       <65          >10.2 Hypoparathyroidism                  <15          < 8.6 Non-Parathyroid Hypocalcemia    15 - 65          < 8.6   CBC w/Diff     Status: Abnormal   Collection Time: 11/30/17   8:41 AM  Result Value Ref Range   WBC 13.1 (H) 3.4 - 10.8 x10E3/uL   RBC 5.43 (H) 3.77 - 5.28 x10E6/uL   Hemoglobin 17.2 (H) 11.1 - 15.9 g/dL   Hematocrit 51.4 (H) 34.0 - 46.6 %   MCV 95 79 - 97 fL   MCH 31.7 26.6 - 33.0 pg   MCHC 33.5 31.5 - 35.7 g/dL   RDW 15.7 (H) 12.3 - 15.4 %   Platelets 900 (HH) 150 - 379 x10E3/uL   Neutrophils 69 Not Estab. %   Lymphs 17 Not Estab. %   Monocytes 11 Not Estab. %   Eos 1 Not Estab. %   Basos 1 Not Estab. %   Neutrophils Absolute 9.2 (H) 1.4 - 7.0 x10E3/uL   Lymphocytes Absolute 2.2 0.7 - 3.1 x10E3/uL   Monocytes Absolute 1.4 (H) 0.1 - 0.9 x10E3/uL   EOS (ABSOLUTE) 0.2 0.0 - 0.4 x10E3/uL   Basophils Absolute 0.1 0.0 - 0.2 x10E3/uL   Immature Granulocytes 1 Not Estab. %   Immature Grans (Abs) 0.1 0.0 - 0.1 x10E3/uL  Comprehensive metabolic panel     Status: Abnormal   Collection Time: 11/30/17  8:41 AM  Result Value Ref Range   Glucose 65 65 - 99 mg/dL    Comment: **Verified by repeat analysis** Specimen received in contact with cells. No visible hemolysis present. However GLUC may be decreased and K increased. Clinical correlation indicated.    BUN 7 (L) 8 - 27 mg/dL   Creatinine, Ser 0.67 0.57 - 1.00 mg/dL   GFR calc non Af Amer 94 >59 mL/min/1.73   GFR calc Af Amer 108 >59 mL/min/1.73   BUN/Creatinine Ratio 10 (L) 12 - 28   Sodium 143 134 - 144 mmol/L   Potassium 6.3 (H) 3.5 - 5.2 mmol/L   Chloride 102 96 - 106 mmol/L   CO2 23 20 - 29  mmol/L   Calcium 10.7 (H) 8.7 - 10.3 mg/dL   Total Protein 7.7 6.0 - 8.5 g/dL   Albumin 4.8 3.6 - 4.8 g/dL   Globulin, Total 2.9 1.5 - 4.5 g/dL   Albumin/Globulin Ratio 1.7 1.2 - 2.2   Bilirubin Total 0.3 0.0 - 1.2 mg/dL   Alkaline Phosphatase 102 39 - 117 IU/L   AST 20 0 - 40 IU/L   ALT 16 0 - 32 IU/L  BCR-ABL1, CML/ALL, PCR, QUANT     Status: None   Collection Time: 12/04/17  9:27 AM  Result Value Ref Range   b2a2 transcript Comment %    Comment: (NOTE)           <0.0032 % (sensitivity  limit of assay)    b3a2 transcript Comment %    Comment: (NOTE)           <0.0032 % (sensitivity limit of assay)    E1A2 Transcript Comment %    Comment: (NOTE)           <0.0032 % (sensitivity limit of assay)    Interpretation (BCRAL): Comment     Comment: (NOTE) NEGATIVE for the BCR-ABL1 e1a2 (p190), e13a2 (b2a2, p210) and e14a2 (b3a2, p210) fusion transcripts. These results do not rule out the presence of rare BCR-ABL1 transcripts not detected by this assay.    Director Review [facility]): Comment     Comment: (NOTE) Constance Goltz, PhD, [facility]               Director, [place] for [place] and [place], [place]    Background: Comment     Comment: (NOTE) This assay can detect three different types of BCR-ABL1 fusion transcripts associated with CML, ALL, and AML: e13a2 (previously b2a2) and e14a2 (previously b3a2) (major breakpoint, p210), as well as e1a2 (minor breakpoint, p190). The e13a2 and e14a2 transcript values are titrated to the current International Scale (IS). The standardized baseline is 100% BCR-ABL1 (IS) and major molecular response (MMR) is equivalent to 0.1% BCR-ABL1 (IS) corresponding to a 3-log reduction. Results should be correlated with appropriate clinical and laboratory information as indicated.    Methodology Comment     Comment: (NOTE) Total RNA is isolated from the sample and subject to a real-time, reverse transcriptase polymerase chain reaction (RT-PCR). The PCR primers and probes are specific for BCR-ABL1 e13a2, e14a2 and e1a2 fusion transcripts. The ABL1 transcript is amplified as the control for cDNA quantity and quality. Serial dilutions of a validated positive control RNA with known t(9;22) BCR-ABL1 are used as reference for quantification of BCR-ABL1 relative to ABL1. The numeric BCR-ABL1 level is reportd as % BCR-ABL1/ABL1  and the detection sensitivity is 4.5 log below the standard baseline. This test was developed and its performance characteristics determined by [facility]. It has not been cleared or approved by the Food and Drug Administration. References:    1. Anastasia Fiedler and Branford S: Seminars in Hematology 2003;       40 (suppl2):62-68.    2. White HE, et al. Blood 2010; 116: e111-117.    3. NCCN Clinical Practice Guidelines in Oncology, Chronic  Myeloid Leukemia. V2. 2017.  Performed At: [facility] [address] [place], [place] [id number] Nechama Guard MD [id number] Performed At: [place], [place] [id number] Nechama Guard MD [id number] Performed at [facility], [address], [place], [place] [address]   CBC with Differential/Platelet     Status: Abnormal   Collection Time: 12/04/17  9:27 AM  Result Value Ref Range   WBC 14.2 (H) 4.0 - 10.5 K/uL   RBC 5.20 (H) 3.87 - 5.11 MIL/uL   Hemoglobin 16.1 (H) 12.0 - 15.0 g/dL   HCT 48.2 (H) 36.0 - 46.0 %   MCV 92.7 78.0 - 100.0 fL   MCH 31.0 26.0 - 34.0 pg   MCHC 33.4 30.0 - 36.0 g/dL   RDW 15.5 11.5 - 15.5 %   Platelets 778 (H) 150 - 400 K/uL   Neutrophils Relative % 77 %   Neutro Abs 11.0 (H) 1.7 - 7.7 K/uL   Lymphocytes Relative 13 %   Lymphs Abs 1.8 0.7 - 4.0 K/uL   Monocytes Relative 9 %   Monocytes Absolute 1.3 (H) 0.1 - 1.0 K/uL   Eosinophils Relative 1 %   Eosinophils Absolute 0.1 0.0 - 0.7 K/uL   Basophils Relative 0 %   Basophils Absolute 0.1 0.0 - 0.1 K/uL    Comment: Performed at [facility], [address], [place], [place] [address]  Comprehensive metabolic panel     Status: None   Collection Time: 12/04/17  9:27 AM  Result Value Ref Range   Sodium 140 135 - 145 mmol/L   Potassium 4.2 3.5 - 5.1 mmol/L   Chloride 102 101 - 111 mmol/L   CO2 25 22 - 32 mmol/L   Glucose, Bld 94 65 - 99 mg/dL   BUN 11 6 - 20 mg/dL   Creatinine, Ser 0.69 0.44 - 1.00 mg/dL   Calcium  10.0 8.9 - 10.3 mg/dL   Total Protein 7.9 6.5 - 8.1 g/dL   Albumin 4.6 3.5 - 5.0 g/dL   AST 22 15 - 41 U/L   ALT 16 14 - 54 U/L   Alkaline Phosphatase 91 38 - 126 U/L   Total Bilirubin 0.6 0.3 - 1.2 mg/dL   GFR calc non Af Amer >60 >60 mL/min   GFR calc Af Amer >60 >60 mL/min    Comment: (NOTE) The eGFR has been calculated using the CKD EPI equation. This calculation has not been validated in all clinical situations. eGFR's persistently <60 mL/min signify possible Chronic Kidney Disease.    Anion gap 13 5 - 15    Comment: Performed at [facility], [address], [place], [place] [address]  Urinalysis, Routine w reflex microscopic     Status: Abnormal   Collection Time: 12/04/17  9:27 AM  Result Value Ref Range   Color, Urine STRAW (A) YELLOW   APPearance CLEAR CLEAR   Specific Gravity, Urine 1.002 (L) 1.005 - 1.030   pH 6.0 5.0 - 8.0   Glucose, UA NEGATIVE NEGATIVE mg/dL   Hgb urine dipstick NEGATIVE NEGATIVE   Bilirubin Urine NEGATIVE NEGATIVE   Ketones, ur NEGATIVE NEGATIVE mg/dL   Protein, ur NEGATIVE NEGATIVE mg/dL   Nitrite NEGATIVE NEGATIVE   Leukocytes, UA NEGATIVE NEGATIVE    Comment: Performed at [facility], [address] [place], [place] [address]  Erythropoietin     Status: Abnormal   Collection Time: 12/04/17  9:27 AM  Result Value Ref Range   Erythropoietin  2.0 (L) 2.6 - 18.5 mIU/mL    Comment: (NOTE) Beckman Coulter UniCel DxI 800 Immunoassay System Values obtained with different assay methods or kits cannot be used interchangeably. Results cannot be interpreted as absolute evidence of the presence or absence of malignant disease. Performed At: [facility] [place], [place] [id number] Rush Farmer MD [id number]:[id number] Performed at [facility], [address] [place], [place] [address]   Lactate dehydrogenase     Status: Abnormal   Collection Time: 12/04/17  9:27 AM  Result Value Ref Range   LDH 264 (H) 98 - 192 U/L    Comment:  Performed at [facility], [address], [place], [place] [address]  JAK2 V617F, w Reflex to CALR/E12/MPL     Status: Abnormal   Collection Time: 12/04/17  9:27 AM  Result Value Ref Range   JAK2 GenotypR Comment (A)     Comment: (NOTE) Result:  POSITIVE for the detection of the V617F mutation. Interpretation:  The assay detected the presence of a G to T nucleotide change encoding the V617F mutation within JAK2. Interpretation of this result should be made in the context of other clinical, morphologic, and cytogenetic findings.    BACKGROUND: Comment     Comment: (NOTE) JAK2 is a cytoplasmic tyrosine kinase with a key role in signal transduction from multiple hematopoietic growth factor receptors. A point mutation within exon 14 of the JAK2 gene ([id number]) encoding a valine to phenylalanine substitution at position 617 of the JAK2 protein (V617F) has been identified in most patients with polycythemia vera, and in about half of those with either essential thrombocythemia or idiopathic myelofibrosis. The V617F has also been detected, although infrequently, in other myeloid disorders such as chronic myelomonocytic leukemia and chronic neutrophilic luekemia. V617F is an acquired mutation that alters a highly conserved valine present in the negative regulatory JH2 domain of the JAK2 protein and is predicted to dysregulate kinase activity. Methodology: Total genomic DNA was extracted and subjected to TaqMan real-time PCR amplification/detection. Two amplification products per sample were monitored by real-time PCR using primers/probes s pecific to JAK2 wild type (WT) and JAK2 mutant V617F. The ABI7900 Absolute Quantitation software will compare the patient specimen valuse to the standard curves and generate percent values for wild type and mutant type. In vitro studies have indicated that this assay has an analytical sensitivity of 1%. References: Baxter EJ, Scott Phineas Real, et al.  Acquired mutation of the tyrosine kinase JAK2 in human myeloproliferative disorders. Lancet. 2005 Mar 19-25; 365(9464):1054-1061. Alfonso Ramus Couedic JP. A unique clonal JAK2 mutation leading to constitutive signaling causes polycythaemia vera. Nature. 2005 Apr 28; 434(7037):1144-1148. Kralovics R, Passamonti F, Buser AS, et al. A gain-of-function mutation of JAK2 in myeloproliferative disorders. N Engl J Med. 2005 Apr 28; 352(17):1779-1790.    Director Review, JAK2 Comment     Comment: (NOTE) Constance Goltz, PhD, [facility]               Director, [place] for [place] and [place], [place]               [phone] This test was developed and its performance characteristics determined by [facility]. It has not been cleared or approved  by the Food and Drug Administration.    REFLEX: Comment     Comment: (NOTE) Reflex to CALR Mutation Analysis, JAK2 Exon 12-15 Mutation Analysis, and MPL Mutation Analysis is not indicated.    Extraction Completed     Comment: (NOTE) Performed At: [facility] RT[facility] [address] [place], [place] [id number] Nechama Guard MD [id number]:[id number] Performed At: [facility] RT[facility] [place], [place] [id number] Nechama Guard MD [id number] Performed at [facility], [address] [place], [place] [address]        RADIOGRAPHIC STUDIES: I have personally reviewed the radiological images as listed and agreed with the findings in the report. Mm Digital Screening Bilateral  Result Date: 11/27/2017 CLINICAL DATA:  Screening. EXAM: DIGITAL SCREENING BILATERAL MAMMOGRAM WITH CAD COMPARISON:  Previous exam(s). ACR Breast Density Category c: The breast tissue is heterogeneously dense, which may obscure small masses. FINDINGS: There are no findings suspicious for malignancy. Images were processed with CAD. IMPRESSION: No mammographic evidence of  malignancy. A result letter of this screening mammogram will be mailed directly to the patient. RECOMMENDATION: Screening mammogram in one year. (Code:SM-B-01Y) BI-RADS CATEGORY  1: Negative. Electronically Signed   By: Ammie Ferrier M.D.   On: 11/27/2017 16:56   Ct Chest Lung Ca Screen Low Dose W/o Cm  Result Date: 12/22/2017 CLINICAL DATA:  62 year old female current smoker with 34 pack-year history of smoking. Lung cancer screening examination. EXAM: CT CHEST WITHOUT CONTRAST LOW-DOSE FOR LUNG CANCER SCREENING TECHNIQUE: Multidetector CT imaging of the chest was performed following the standard protocol without IV contrast. COMPARISON:  No priors. FINDINGS: Cardiovascular: Heart size is normal. There is no significant pericardial fluid, thickening or pericardial calcification. There is aortic atherosclerosis, as well as atherosclerosis of the great vessels of the mediastinum and the coronary arteries, including calcified atherosclerotic plaque in the left anterior descending and right coronary arteries. Mediastinum/Nodes: No pathologically enlarged mediastinal or hilar lymph nodes. Please note that accurate exclusion of hilar adenopathy is limited on noncontrast CT scans. Esophagus is unremarkable in appearance. No axillary lymphadenopathy. Lungs/Pleura: Multiple pulmonary nodules are noted throughout the lungs bilaterally, the largest of which is a pleural-based nodule in the posterior aspect of the right upper lobe (axial image 58 of series 3), with a volume derived mean diameter of 8.9 mm. No other larger more suspicious appearing pulmonary nodules or masses are noted. There are extensive areas of pleuroparenchymal thickening and nodular architectural distortion in the apices of the lungs bilaterally, most compatible with chronic post infectious or inflammatory scarring. No acute consolidative airspace disease. No pleural effusions. Mild diffuse bronchial wall thickening with mild centrilobular and  paraseptal emphysema. Upper Abdomen: Aortic atherosclerosis. Musculoskeletal: There are no aggressive appearing lytic or blastic lesions noted in the visualized portions of the skeleton. IMPRESSION: 1. Amongst the many small pulmonary nodules scattered throughout the lungs bilaterally, there are several other larger nodular areas of architectural distortion, most of which are in the apices of the lungs bilaterally, including the largest nodule mentioned above. These are all favored to be part of underlying chronic post infectious or inflammatory changes, but from a strict size criteria, the 8.9 mm nodule is considered a Lung-RADS 4AS, suspicious. Follow up low-dose chest CT without contrast in 3 months (please use the following order, "CT CHEST LCS NODULE FOLLOW-UP W/O CM") is recommended. 2. The "S" modifier above refers to potentially clinically significant non lung cancer related findings. Specifically, there is aortic atherosclerosis, in addition to 2 vessel coronary  artery disease. Please note that although the presence of coronary artery calcium documents the presence of coronary artery disease, the severity of this disease and any potential stenosis cannot be assessed on this non-gated CT examination. Assessment for potential risk factor modification, dietary therapy or pharmacologic therapy may be warranted, if clinically indicated. 3. Mild diffuse bronchial wall thickening with mild centrilobular and paraseptal emphysema; imaging findings suggestive of underlying COPD. Aortic Atherosclerosis (ICD10-I70.0) and Emphysema (ICD10-J43.9). Electronically Signed   By: Vinnie Langton M.D.   On: 12/22/2017 16:35    ASSESSMENT & PLAN:  Myeloproliferative disorder ([place]) 1.  Jak 2+ myeloproliferative disorder: - She was worked up for thrombocytosis, erythrocytosis and leukocytosis. -I have discussed the results of the Jak 2 V617F testing which was positive.  We talked about diagnosis of myeloproliferative  disorders in general and polycythemia vera in particular.  She does not have any history of vasomotor symptoms or thrombosis.  However given her age more than 34, which puts her at high risk.  I have recommended initiating hydroxyurea.  We talked about the side effects in detail.  I have sent a prescription for hydroxyurea 500 mg daily, to titrate it up as needed.  We will also schedule her for a bone marrow aspiration and biopsy as a baseline with a request for reticulin staining.  She will continue taking aspirin 81 mg daily.  I will see her back in 2 to 3 weeks with repeat blood work.  Our treatment goal is to decrease the platelet count below 400, hematocrit below 42% and try to normalize white count.  2.  Sleeplessness: -Patient reports that she is unable to sleep for the past few weeks, has a lot of stresses going on in her life.  She reports that she has taken Xanax in the past which help her sleep.  I will give a prescription for Xanax 0.25 mg as needed for sleep.  3.  Lung nodules: - Patient has a longtime smoking history.  I have ordered CT scan of the chest screening protocol.  This showed many small pulmonary nodules scattered throughout the lungs bilaterally, there are several other larger nodular areas of architectural distortion, most of which are in the apices.  I discussed the results of the CT scan with the patient.  They have recommended a follow-up scan in 3 months.  We will order a follow-up scan without contrast.     All questions were answered. The patient knows to call the clinic with any problems, questions or concerns.    This note includes documentation from Mike Craze, NP.  I have reviewed this note for its completeness and accuracy.  I have edited this note accordingly based on my findings and medical opinion.      Derek Jack, MD  12/25/17 12:36 PM

## 2017-12-25 NOTE — Addendum Note (Signed)
Addended by: Derek Jack on: 12/25/2017 01:43 PM   Modules accepted: Orders

## 2017-12-25 NOTE — Patient Instructions (Signed)
[facility] at [facility] Discharge Instructions  Today you saw Dr. K.   Thank you for choosing [facility] at [facility] to provide your oncology and hematology care.  To afford each patient quality time with our provider, please arrive at least 15 minutes before your scheduled appointment time.   If you have a lab appointment with the Cancer Center please come in thru the  Main Entrance and check in at the main information desk  You need to re-schedule your appointment should you arrive 10 or more minutes late.  We strive to give you quality time with our providers, and arriving late affects you and other patients whose appointments are after yours.  Also, if you no show three or more times for appointments you may be dismissed from the clinic at the providers discretion.     Again, thank you for choosing [facility].  Our hope is that these requests will decrease the amount of time that you wait before being seen by our physicians.       _____________________________________________________________  Should you have questions after your visit to [facility], please contact our office at [phone] between the hours of 8:30 a.m. and 4:30 p.m.  Voicemails left after 4:30 p.m. will not be returned until the following business day.  For prescription refill requests, have your pharmacy contact our office.       Resources For Cancer Patients and their Caregivers ? American Cancer Society: Can assist with transportation, wigs, general needs, runs Look Good Feel Better.        1-888-227-6333 ? Cancer Care: Provides financial assistance, online support groups, medication/co-pay assistance.  1-800-813-HOPE (4673) ? [facility] Assists [place] cancer patients and their families through emotional , educational and financial support.  336-427-4357 ? [place] DSS Where to apply for food  stamps, Medicaid and utility assistance. 336-342-1394 ? RCATS: Transportation to medical appointments. 336-347-2287 ? Social Security Administration: May apply for disability if have a Stage IV cancer. 336-342-7796 1-800-772-1213 ? [place] Aging, Disability and Transit Services: Assists with nutrition, care and transit needs. 336-349-2343  Cancer Center Support Programs:   > Cancer Support Group  2nd Tuesday of the month 1pm-2pm, Journey Room   > Creative Journey  3rd Tuesday of the month 1130am-1pm, Journey Room    

## 2017-12-25 NOTE — Addendum Note (Signed)
Addended by: Jaynie Collins R on: 12/25/2017 01:38 PM   Modules accepted: Orders

## 2017-12-25 NOTE — Assessment & Plan Note (Addendum)
1.  Jak 2+ myeloproliferative disorder: - She was worked up for thrombocytosis, erythrocytosis and leukocytosis. -I have discussed the results of the Jak 2 V617F testing which was positive.  We talked about diagnosis of myeloproliferative disorders in general and polycythemia vera in particular.  She does not have any history of vasomotor symptoms or thrombosis.  However given her age more than 15, which puts her at high risk.  I have recommended initiating hydroxyurea.  We talked about the side effects in detail.  I have sent a prescription for hydroxyurea 500 mg daily, to titrate it up as needed.  We will also schedule her for a bone marrow aspiration and biopsy as a baseline with a request for reticulin staining.  She will continue taking aspirin 81 mg daily.  I will see her back in 2 to 3 weeks with repeat blood work.  Our treatment goal is to decrease the platelet count below 400, hematocrit below 42% and try to normalize white count.  2.  Sleeplessness: -Patient reports that she is unable to sleep for the past few weeks, has a lot of stresses going on in her life.  She reports that she has taken Xanax in the past which help her sleep.  I will give a prescription for Xanax 0.25 mg as needed for sleep.  3.  Lung nodules: - Patient has a longtime smoking history.  I have ordered CT scan of the chest screening protocol.  This showed many small pulmonary nodules scattered throughout the lungs bilaterally, there are several other larger nodular areas of architectural distortion, most of which are in the apices.  I discussed the results of the CT scan with the patient.  They have recommended a follow-up scan in 3 months.  We will order a follow-up scan without contrast.

## 2017-12-28 ENCOUNTER — Telehealth (HOSPITAL_COMMUNITY): Payer: Self-pay | Admitting: Hematology

## 2017-12-28 NOTE — Telephone Encounter (Signed)
FAXED HYDREA RX TO [facility]

## 2017-12-29 ENCOUNTER — Telehealth (HOSPITAL_COMMUNITY): Payer: Self-pay | Admitting: Hematology

## 2017-12-29 NOTE — Telephone Encounter (Signed)
Faxed Hydrea script to [facility] per pt request.

## 2017-12-29 NOTE — Telephone Encounter (Signed)
Per [facility] the can NOT fill pts Hydrea script.   Contacted [facility] and they stated the script is ready for delivery and they are waiting for a return call from the pt.  PC to pt had to leave a vm on her cell advising her to contact [facility] to set her delivery.

## 2018-01-11 ENCOUNTER — Other Ambulatory Visit (HOSPITAL_COMMUNITY): Payer: BLUE CROSS/BLUE SHIELD

## 2018-01-11 ENCOUNTER — Encounter (HOSPITAL_COMMUNITY): Payer: Self-pay | Admitting: Hematology

## 2018-01-11 ENCOUNTER — Inpatient Hospital Stay (HOSPITAL_COMMUNITY): Payer: BLUE CROSS/BLUE SHIELD | Attending: Hematology | Admitting: Hematology

## 2018-01-11 ENCOUNTER — Inpatient Hospital Stay (HOSPITAL_COMMUNITY): Payer: BLUE CROSS/BLUE SHIELD

## 2018-01-11 DIAGNOSIS — D45 Polycythemia vera: Secondary | ICD-10-CM | POA: Diagnosis not present

## 2018-01-11 DIAGNOSIS — G4700 Insomnia, unspecified: Secondary | ICD-10-CM | POA: Diagnosis not present

## 2018-01-11 DIAGNOSIS — D473 Essential (hemorrhagic) thrombocythemia: Secondary | ICD-10-CM | POA: Insufficient documentation

## 2018-01-11 DIAGNOSIS — R918 Other nonspecific abnormal finding of lung field: Secondary | ICD-10-CM | POA: Diagnosis not present

## 2018-01-11 DIAGNOSIS — C946 Myelodysplastic disease, not classified: Secondary | ICD-10-CM | POA: Diagnosis not present

## 2018-01-11 DIAGNOSIS — D751 Secondary polycythemia: Secondary | ICD-10-CM

## 2018-01-11 DIAGNOSIS — D75839 Thrombocytosis, unspecified: Secondary | ICD-10-CM

## 2018-01-11 DIAGNOSIS — Z1589 Genetic susceptibility to other disease: Secondary | ICD-10-CM

## 2018-01-11 LAB — CBC WITH DIFFERENTIAL/PLATELET
Basophils Absolute: 0.1 10*3/uL (ref 0.0–0.1)
Basophils Relative: 0 %
EOS ABS: 0.2 10*3/uL (ref 0.0–0.7)
EOS PCT: 1 %
HCT: 51.7 % — ABNORMAL HIGH (ref 36.0–46.0)
Hemoglobin: 17.4 g/dL — ABNORMAL HIGH (ref 12.0–15.0)
LYMPHS ABS: 2.2 10*3/uL (ref 0.7–4.0)
LYMPHS PCT: 13 %
MCH: 31.4 pg (ref 26.0–34.0)
MCHC: 33.7 g/dL (ref 30.0–36.0)
MCV: 93.2 fL (ref 78.0–100.0)
MONOS PCT: 6 %
Monocytes Absolute: 1.1 10*3/uL — ABNORMAL HIGH (ref 0.1–1.0)
Neutro Abs: 13.5 10*3/uL — ABNORMAL HIGH (ref 1.7–7.7)
Neutrophils Relative %: 80 %
PLATELETS: 655 10*3/uL — AB (ref 150–400)
RBC: 5.55 MIL/uL — ABNORMAL HIGH (ref 3.87–5.11)
RDW: 16.7 % — ABNORMAL HIGH (ref 11.5–15.5)
WBC: 17 10*3/uL — ABNORMAL HIGH (ref 4.0–10.5)

## 2018-01-11 LAB — COMPREHENSIVE METABOLIC PANEL
ALK PHOS: 100 U/L (ref 38–126)
ALT: 19 U/L (ref 14–54)
ANION GAP: 9 (ref 5–15)
AST: 23 U/L (ref 15–41)
Albumin: 4.4 g/dL (ref 3.5–5.0)
BUN: 12 mg/dL (ref 6–20)
CALCIUM: 10.2 mg/dL (ref 8.9–10.3)
CHLORIDE: 105 mmol/L (ref 101–111)
CO2: 27 mmol/L (ref 22–32)
CREATININE: 0.63 mg/dL (ref 0.44–1.00)
Glucose, Bld: 77 mg/dL (ref 65–99)
Potassium: 4 mmol/L (ref 3.5–5.1)
SODIUM: 141 mmol/L (ref 135–145)
Total Bilirubin: 0.6 mg/dL (ref 0.3–1.2)
Total Protein: 8.2 g/dL — ABNORMAL HIGH (ref 6.5–8.1)

## 2018-01-11 LAB — LACTATE DEHYDROGENASE: LDH: 227 U/L — ABNORMAL HIGH (ref 98–192)

## 2018-01-11 MED ORDER — LIDOCAINE HCL (PF) 1 % IJ SOLN
INTRAMUSCULAR | Status: AC
Start: 2018-01-11 — End: ?
  Filled 2018-01-11: qty 5

## 2018-01-11 NOTE — Patient Instructions (Signed)
[facility] at [facility] Discharge Instructions  Bone Marrow Aspiration and Bone Marrow Biopsy, Adult, Care After This sheet gives you information about how to care for yourself after your procedure. Your health care provider may also give you more specific instructions. If you have problems or questions, contact your health care provider. What can I expect after the procedure? After the procedure, it is common to have:  Mild pain and tenderness.  Swelling.  Bruising.  Follow these instructions at home:  Take over-the-counter or prescription medicines only as told by your health care provider.  Do not take baths, swim, or use a hot tub until your health care provider approves. Ask if you can take a shower or have a sponge bath.  Follow instructions from your health care provider about how to take care of the puncture site. Make sure you: ? Wash your hands with soap and water before you change your bandage (dressing). If soap and water are not available, use hand sanitizer. ? Change your dressing as told by your health care provider.  Check your puncture site every day for signs of infection. Check for: ? More redness, swelling, or pain. ? More fluid or blood. ? Warmth. ? Pus or a bad smell.  Return to your normal activities as told by your health care provider. Ask your health care provider what activities are safe for you.  Do not drive for 24 hours if you were given a medicine to help you relax (sedative).  Keep all follow-up visits as told by your health care provider. This is important. Contact a health care provider if:  You have more redness, swelling, or pain around the puncture site.  You have more fluid or blood coming from the puncture site.  Your puncture site feels warm to the touch.  You have pus or a bad smell coming from the puncture site.  You have a fever.  Your pain is not controlled with medicine. This information is not  intended to replace advice given to you by your health care provider. Make sure you discuss any questions you have with your health care provider. Document Released: 02/14/2005 Document Revised: 02/15/2016 Document Reviewed: 01/09/2016 Elsevier Interactive Patient Education  2018 [employer].           Thank you for choosing [place] at [facility] to provide your oncology and hematology care.  To afford each patient quality time with our provider, please arrive at least 15 minutes before your scheduled appointment time.   If you have a lab appointment with the [facility] please come in thru the  Main Entrance and check in at the main information desk  You need to re-schedule your appointment should you arrive 10 or more minutes late.  We strive to give you quality time with our providers, and arriving late affects you and other patients whose appointments are after yours.  Also, if you no show three or more times for appointments you may be dismissed from the clinic at the providers discretion.     Again, thank you for choosing [facility].  Our hope is that these requests will decrease the amount of time that you wait before being seen by our physicians.       _____________________________________________________________  Should you have questions after your visit to [facility], please contact our office at [phone] between the hours of 8:30 a.m. and 4:30 p.m.  Voicemails left  after 4:30 p.m. will not be returned until the following business day.  For prescription refill requests, have your pharmacy contact our office.       Resources For Cancer Patients and their Caregivers ? American Cancer Society: Can assist with transportation, wigs, general needs, runs Look Good Feel Better.        6418687369 ? Cancer Care: Provides financial assistance, online support groups, medication/co-pay assistance.  1-800-813-HOPE  2531890547) ? [facility] Assists [place] cancer patients and their families through emotional , educational and financial support.  848-333-8846 ? [place] DSS Where to apply for food stamps, Medicaid and utility assistance. (878) 668-8486 ? RCATS: Transportation to medical appointments. 613-616-9459 ? Social Security Administration: May apply for disability if have a Stage IV cancer. 7780694531 (215)256-0314 ? [employer], Disability and Transit Services: Assists with nutrition, care and transit needs. [place] Support Programs:   > Cancer Support Group  2nd Tuesday of the month 1pm-2pm, Journey Room   > Creative Journey  3rd Tuesday of the month 1130am-1pm, Journey Room

## 2018-01-11 NOTE — Progress Notes (Signed)
[name], [place] [address]   CLINIC:  Medical Oncology/Hematology  PCP:  Sharilyn Sites, [place] [address] [phone]   REASON FOR VISIT:  Follow-up for polycythemia vera.  CURRENT THERAPY: Hydroxyurea 500 mg daily.    INTERVAL HISTORY:  Denise Maynard 62 y.o. female returns for follow-up of polycythemia vera.  She is taking hydroxyurea 500 mg daily.  She started on 12/26/2017.  No nausea vomiting or diarrhea was reported.  No skin rashes.  She is reporting stable energy levels.  Xanax is helping her anxiety.  Denies any fevers or infections.    REVIEW OF SYSTEMS:  Review of Systems  Constitutional: Positive for fatigue.  All other systems reviewed and are negative.    PAST MEDICAL/SURGICAL HISTORY:  Past Medical History:  Diagnosis Date  . Carotid artery aneurysm (HCC)    Coil embolization of a supraclinoid right internal carotid artery - Dr. Kathyrn Sheriff  . Hyperlipidemia   . Nicotine addiction 05/15/2015   Will try patch  . Serum calcium elevated 11/19/2017   recheck  . Serum potassium elevated 11/19/2017  . Stroke [facility])    Past Surgical History:  Procedure Laterality Date  . ABDOMINAL AORTAGRAM N/A 11/24/2014   Procedure: ABDOMINAL Maxcine Ham;  Surgeon: Elam Dutch, MD;  Location[facility] C[facility] LAB;  Service: Cardiovascular;  Laterality: N/A;  . Aneursym repair  10/2014  . COLONOSCOPY N/A 07/30/2015   Procedure: COLONOSCOPY;  Surgeon: Danie Binder, MD;  Location: [facility] ENDO SUITE;  Service: Endoscopy;  Laterality: N/A;  1:00 Pm - moved to 1:30 - office to notify  . FRACTURE SURGERY     Jaw  . LOWER EXTREMITY ANGIOGRAM  11/24/2014   Procedure: LOWER EXTREMITY ANGIOGRAM;  Surgeon: Elam Dutch, MD;  Location: [facility] CATH LAB;  Service: Cardiovascular;;  . MOUTH SURGERY    . RADIOLOGY WITH ANESTHESIA N/A 10/12/2014   Procedure: Embolization/arteriogram;  Surgeon: Consuella Lose, MD;  Location: [facility];   Service: Radiology;  Laterality: N/A;     SOCIAL HISTORY:  Social History   Socioeconomic History  . Marital status: Divorced    Spouse name: Not on file  . Number of children: Not on file  . Years of education: Not on file  . Highest education level: Not on file  Occupational History  . Not on file  Social Needs  . Financial resource strain: Not on file  . Food insecurity:    Worry: Not on file    Inability: Not on file  . Transportation needs:    Medical: Not on file    Non-medical: Not on file  Tobacco Use  . Smoking status: Current Every Day Smoker    Packs/day: 0.50    Years: 30.00    Pack years: 15.00    Types: Cigarettes  . Smokeless tobacco: Never Used  Substance and Sexual Activity  . Alcohol use: No    Alcohol/week: 0.0 oz  . Drug use: No  . Sexual activity: Yes    Birth control/protection: Post-menopausal  Lifestyle  . Physical activity:    Days per week: Not on file    Minutes per session: Not on file  . Stress: Not on file  Relationships  . Social connections:    Talks on phone: Not on file    Gets together: Not on file    Attends religious service: Not on file    Active member of club or organization: Not on file  Attends meetings of clubs or organizations: Not on file    Relationship status: Not on file  . Intimate partner violence:    Fear of current or ex partner: Not on file    Emotionally abused: Not on file    Physically abused: Not on file    Forced sexual activity: Not on file  Other Topics Concern  . Not on file  Social History Narrative  . Not on file    FAMILY HISTORY:  Family History  Problem Relation Age of Onset  . Heart attack Mother   . Diabetes Mother   . Throat cancer Father   . Hypertension Father   . Hyperlipidemia Sister   . Diabetes Maternal Grandmother   . Cancer Maternal Grandfather   . Heart attack Paternal Grandfather   . Hyperlipidemia Sister     CURRENT MEDICATIONS:  Outpatient Encounter Medications  as of 01/11/2018  Medication Sig  . acetaminophen (TYLENOL) 500 MG tablet Take 1,000 mg by mouth every 6 (six) hours as needed for mild pain or moderate pain.   Marland Kitchen ALPRAZolam (XANAX) 0.25 MG tablet Take 1 tablet (0.25 mg total) by mouth at bedtime as needed for anxiety.  Marland Kitchen aspirin EC 81 MG tablet Take 81 mg by mouth daily.  . cholecalciferol (VITAMIN D) 1000 UNITS tablet Take 1,000 Units by mouth daily.  . hydroxyurea (HYDREA) 500 MG capsule Take 1 capsule (500 mg total) by mouth daily. May take with food to minimize GI side effects.  . Multiple Vitamins-Minerals (MULTIVITAMIN WITH MINERALS) tablet Take 1 tablet by mouth daily.  . Omega-3 Fatty Acids (FISH OIL) 1200 MG CAPS Take 1,200 mg by mouth 2 (two) times daily.   No facility-administered encounter medications on file as of 01/11/2018.     ALLERGIES:  No Known Allergies   PHYSICAL EXAM:  ECOG Performance status: 1  I have reviewed her vitals. Physical Exam Examination of skin did not reveal any rashes.  LABORATORY DATA:  I have reviewed the labs as listed.  CBC    Component Value Date/Time   WBC 17.0 (H) 01/11/2018 0817   RBC 5.55 (H) 01/11/2018 0817   HGB 17.4 (H) 01/11/2018 0817   HGB 17.2 (H) 11/30/2017 0841   HCT 51.7 (H) 01/11/2018 0817   HCT 51.4 (H) 11/30/2017 0841   PLT 655 (H) 01/11/2018 0817   PLT 900 (HH) 11/30/2017 0841   MCV 93.2 01/11/2018 0817   MCV 95 11/30/2017 0841   MCH 31.4 01/11/2018 0817   MCHC 33.7 01/11/2018 0817   RDW 16.7 (H) 01/11/2018 0817   RDW 15.7 (H) 11/30/2017 0841   LYMPHSABS 2.2 01/11/2018 0817   LYMPHSABS 2.2 11/30/2017 0841   MONOABS 1.1 (H) 01/11/2018 0817   EOSABS 0.2 01/11/2018 0817   EOSABS 0.2 11/30/2017 0841   BASOSABS 0.1 01/11/2018 0817   BASOSABS 0.1 11/30/2017 0841   CMP Latest Ref Rng & Units 01/11/2018 12/04/2017 11/30/2017  Glucose 65 - 99 mg/dL 77 94 65  BUN 6 - 20 mg/dL 12 11 7(L)  Creatinine 0.44 - 1.00 mg/dL 0.63 0.69 0.67  Sodium 135 - 145 mmol/L 141 140 143    Potassium 3.5 - 5.1 mmol/L 4.0 4.2 6.3(H)  Chloride 101 - 111 mmol/L 105 102 102  CO2 22 - 32 mmol/L _0 Calcium 8.9 - 10.3 mg/dL 10.2 10.0 10.7(H)  Total Protein 6.5 - 8.1 g/dL 8.2(H) 7.9 7.7  Total Bilirubin 0.3 - 1.2 mg/dL 0.6 0.6 0.3  Alkaline Phos 38 -  126 U/L 100 91 102  AST 15 - 41 U/L _0 ALT 14 - 54 U/L _1 ASSESSMENT & PLAN:   Thrombocytosis (HCC) 1.  Jak 2+ myeloproliferative disorder: - She was worked up for thrombocytosis, erythrocytosis and leukocytosis. -She did not have any history of vasomotor symptoms or thrombosis.  Given her age more than 50, she was recommended to start on hydroxyurea.  She started taking Hydrea 500 mg daily on 12/26/2017.  She denies any nausea or mucositis.  No skin ulcers were noted.  She is tolerating it very well.  Her platelet count has come down to 655.  She will undergo a baseline bone marrow biopsy today.  I plan to increase hydroxyurea to 2 tablets daily.  2.  Sleeplessness: -Patient reports that she is unable to sleep for the past few weeks, has a lot of stresses going on in her life.  She reports that she has taken Xanax in the past which help her sleep.  She is taking Xanax 0.25 mg as needed for sleep.  3.  Lung nodules: - Patient has a longtime smoking history.  I have ordered CT scan of the chest (12/22/2017) screening protocol.  This showed many small pulmonary nodules scattered throughout the lungs bilaterally, there are several other larger nodular areas of architectural distortion, most of which are in the apices.  I discussed the results of the CT scan with the patient.  They have recommended a follow-up scan in 3 months.  We will order a follow-up scan without contrast.      Orders placed this encounter:  No orders of the defined types were placed in this encounter.     Derek Jack, MD [place] [phone]

## 2018-01-11 NOTE — Assessment & Plan Note (Signed)
1.  Jak 2+ myeloproliferative disorder: - She was worked up for thrombocytosis, erythrocytosis and leukocytosis. -She did not have any history of vasomotor symptoms or thrombosis.  Given her age more than 69, she was recommended to start on hydroxyurea.  She started taking Hydrea 500 mg daily on 12/26/2017.  She denies any nausea or mucositis.  No skin ulcers were noted.  She is tolerating it very well.  Her platelet count has come down to 655.  She will undergo a baseline bone marrow biopsy today.  I plan to increase hydroxyurea to 2 tablets daily.  2.  Sleeplessness: -Patient reports that she is unable to sleep for the past few weeks, has a lot of stresses going on in her life.  She reports that she has taken Xanax in the past which help her sleep.  She is taking Xanax 0.25 mg as needed for sleep.  3.  Lung nodules: - Patient has a longtime smoking history.  I have ordered CT scan of the chest (12/22/2017) screening protocol.  This showed many small pulmonary nodules scattered throughout the lungs bilaterally, there are several other larger nodular areas of architectural distortion, most of which are in the apices.  I discussed the results of the CT scan with the patient.  They have recommended a follow-up scan in 3 months.  We will order a follow-up scan without contrast.

## 2018-01-11 NOTE — Progress Notes (Signed)
Patient presented today for a bone marrow biopsy. Consent obtained, blood work drawn per orders. Time out done at 0844, 0845 start, stop 0900. Vitals done pre/post procedure. Patient's discharge  instructions given. Patient discharged from clinic ambulatory at [facility]. Follow up as scheduled. Biopsy site, clean dry and intact.

## 2018-01-11 NOTE — Progress Notes (Signed)
INDICATION:    Bone Marrow Biopsy and Aspiration Procedure Note   The patient was identified by name and date of birth, prior to start of the procedure and a timeout was performed.   An informed consent was obtained after discussing potential risks including bleeding, infection and pain.  The left posterior iliac crest was palpated, cleaned with ChloraPrep, and drapes applied.  1% lidocaine is infiltrated into the skin, subcutaneous tissue and periosteum.  Bone marrow was aspirated and smears made.  With the help of Jamshidi needle a core biopsy was obtained.  Pressure was applied to the biopsy site and bandage was placed over the biopsy site. Patient was made to lie on the back for 15 mins prior to discharge.  The procedure was tolerated well. COMPLICATIONS: None BLOOD LOSS: none Patient was discharged home in stable condition to return in 2 weeks to review results.  Patient was provided with post bone marrow biopsy instructions and instructed to call if there was any bleeding or worsening pain.  Specimens sent for flow cytometry, cytogenetics and additional studies.  Signed Vika Buske, MD   

## 2018-01-12 ENCOUNTER — Other Ambulatory Visit (HOSPITAL_COMMUNITY): Payer: Self-pay

## 2018-01-12 DIAGNOSIS — Z1589 Genetic susceptibility to other disease: Secondary | ICD-10-CM

## 2018-01-12 MED ORDER — HEPARIN SOD (PORK) LOCK FLUSH 100 UNIT/ML IV SOLN
INTRAVENOUS | Status: AC
  Filled 2018-01-12: qty 5

## 2018-01-12 MED ORDER — OCTREOTIDE ACETATE 30 MG IM KIT
PACK | INTRAMUSCULAR | Status: AC
  Filled 2018-01-12: qty 1

## 2018-01-12 MED ORDER — HYDROXYUREA 500 MG PO CAPS: 1000.0000 mg | ORAL_CAPSULE | Freq: Every day | ORAL | 1 refills | Status: DC

## 2018-01-12 NOTE — Telephone Encounter (Signed)
Patient called because the doctor increased her medication to 2 pills daily yesterday and she does not have enough medication to make it to her next follow up appt. Reviewed with provider, chart checked and refilled.

## 2018-01-18 ENCOUNTER — Encounter (HOSPITAL_COMMUNITY): Payer: Self-pay | Admitting: Hematology

## 2018-01-21 ENCOUNTER — Other Ambulatory Visit: Payer: Self-pay

## 2018-01-21 ENCOUNTER — Encounter (HOSPITAL_COMMUNITY): Payer: Self-pay | Admitting: Hematology

## 2018-01-21 ENCOUNTER — Inpatient Hospital Stay (HOSPITAL_BASED_OUTPATIENT_CLINIC_OR_DEPARTMENT_OTHER): Payer: BLUE CROSS/BLUE SHIELD | Admitting: Hematology

## 2018-01-21 VITALS — BP 131/65 | HR 62 | Temp 98.4°F | Resp 16 | Wt 141.2 lb

## 2018-01-21 DIAGNOSIS — D75839 Thrombocytosis, unspecified: Secondary | ICD-10-CM

## 2018-01-21 DIAGNOSIS — R918 Other nonspecific abnormal finding of lung field: Secondary | ICD-10-CM | POA: Diagnosis not present

## 2018-01-21 DIAGNOSIS — D473 Essential (hemorrhagic) thrombocythemia: Secondary | ICD-10-CM

## 2018-01-21 DIAGNOSIS — D45 Polycythemia vera: Secondary | ICD-10-CM | POA: Diagnosis not present

## 2018-01-21 DIAGNOSIS — C946 Myelodysplastic disease, not classified: Secondary | ICD-10-CM | POA: Diagnosis not present

## 2018-01-21 DIAGNOSIS — D471 Chronic myeloproliferative disease: Secondary | ICD-10-CM

## 2018-01-21 DIAGNOSIS — G4700 Insomnia, unspecified: Secondary | ICD-10-CM

## 2018-01-21 NOTE — Progress Notes (Signed)
[address], [place] [address]   CLINIC:  Medical Oncology/Hematology  PCP:  Sharilyn Sites, [place] [address] [phone]   REASON FOR VISIT:  Follow-up for myeloproliferative neoplasm.  CURRENT THERAPY: Hydroxyurea 2 tablets daily.   INTERVAL HISTORY:  [name] 62 y.o. female returns for follow-up of myeloproliferative neoplasm.  She is tolerating hydroxyurea 2 tablets very well.  She denies any mucositis or leg ulcers.  No nausea was reported.  Her energy levels are close to normal.  She has occasional slight cough for the last few days.  She is taking Xanax as needed for sleep.  Denies any vasomotor symptoms or aquagenic pruritus.    REVIEW OF SYSTEMS:  Review of Systems  Respiratory: Positive for cough.   All other systems reviewed and are negative.    PAST MEDICAL/SURGICAL HISTORY:  Past Medical History:  Diagnosis Date  . Carotid artery aneurysm (HCC)    Coil embolization of a supraclinoid right internal carotid artery - Dr. Kathyrn Sheriff  . Hyperlipidemia   . Nicotine addiction 05/15/2015   Will try patch  . Serum calcium elevated 11/19/2017   recheck  . Serum potassium elevated 11/19/2017  . Stroke [facility])    Past Surgical History:  Procedure Laterality Date  . ABDOMINAL AORTAGRAM N/A 11/24/2014   Procedure: ABDOMINAL Maxcine Ham;  Surgeon: Elam Dutch, MD;  Location: [facility] CATH LAB;  Service: Cardiovascular;  Laterality: N/A;  . Aneursym repair  10/2014  . COLONOSCOPY N/A 07/30/2015   Procedure: COLONOSCOPY;  Surgeon: Danie Binder, MD;  Location: [facility] ENDO SUITE;  Service: Endoscopy;  Laterality: N/A;  1:00 Pm - moved to 1:30 - office to notify  . FRACTURE SURGERY     Jaw  . LOWER EXTREMITY ANGIOGRAM  11/24/2014   Procedure: LOWER EXTREMITY ANGIOGRAM;  Surgeon: Elam Dutch, MD;  Location: [facility] CATH LAB;  Service: Cardiovascular;;  . MOUTH SURGERY    . RADIOLOGY WITH ANESTHESIA N/A 10/12/2014   Procedure: Embolization/arteriogram;  Surgeon: Consuella Lose, MD;  Location: [facility];  Service: Radiology;  Laterality: N/A;     SOCIAL HISTORY:  Social History   Socioeconomic History  . Marital status: Divorced    Spouse name: Not on file  . Number of children: Not on file  . Years of education: Not on file  . Highest education level: Not on file  Occupational History  . Not on file  Social Needs  . Financial resource strain: Not on file  . Food insecurity:    Worry: Not on file    Inability: Not on file  . Transportation needs:    Medical: Not on file    Non-medical: Not on file  Tobacco Use  . Smoking status: Current Every Day Smoker    Packs/day: 0.50    Years: 30.00    Pack years: 15.00    Types: Cigarettes  . Smokeless tobacco: Never Used  Substance and Sexual Activity  . Alcohol use: No    Alcohol/week: 0.0 oz  . Drug use: No  . Sexual activity: Yes    Birth control/protection: Post-menopausal  Lifestyle  . Physical activity:    Days per week: Not on file    Minutes per session: Not on file  . Stress: Not on file  Relationships  . Social connections:    Talks on phone: Not on file    Gets together: Not on file    Attends religious service: Not on file  Active member of club or organization: Not on file    Attends meetings of clubs or organizations: Not on file    Relationship status: Not on file  . Intimate partner violence:    Fear of current or ex partner: Not on file    Emotionally abused: Not on file    Physically abused: Not on file    Forced sexual activity: Not on file  Other Topics Concern  . Not on file  Social History Narrative  . Not on file    FAMILY HISTORY:  Family History  Problem Relation Age of Onset  . Heart attack Mother   . Diabetes Mother   . Throat cancer Father   . Hypertension Father   . Hyperlipidemia Sister   . Diabetes Maternal Grandmother   . Cancer Maternal Grandfather   . Heart attack Paternal Grandfather    . Hyperlipidemia Sister     CURRENT MEDICATIONS:  Outpatient Encounter Medications as of 01/21/2018  Medication Sig  . acetaminophen (TYLENOL) 500 MG tablet Take 1,000 mg by mouth every 6 (six) hours as needed for mild pain or moderate pain.   Marland Kitchen ALPRAZolam (XANAX) 0.25 MG tablet Take 1 tablet (0.25 mg total) by mouth at bedtime as needed for anxiety.  . Artificial Tear Ointment (DRY EYES OP) Apply to eye.  Marland Kitchen aspirin EC 81 MG tablet Take 81 mg by mouth daily.  . cholecalciferol (VITAMIN D) 1000 UNITS tablet Take 1,000 Units by mouth daily.  . hydroxyurea (HYDREA) 500 MG capsule Take 2 capsules (1,000 mg total) by mouth daily. May take with food to minimize GI side effects.  . Multiple Vitamins-Minerals (MULTIVITAMIN WITH MINERALS) tablet Take 1 tablet by mouth daily.  . Omega-3 Fatty Acids (FISH OIL) 1200 MG CAPS Take 1,200 mg by mouth 2 (two) times daily.   No facility-administered encounter medications on file as of 01/21/2018.     ALLERGIES:  No Known Allergies   PHYSICAL EXAM:  ECOG Performance status: 1  Vitals:   01/21/18 1055  BP: 131/65  Pulse: 62  Resp: 16  Temp: 98.4 F (36.9 C)  SpO2: 97%   Filed Weights   01/21/18 1055  Weight: 141 lb 3.2 oz (64 kg)    Physical Exam HEENT: No mucositis or thrush. Lymphadenopathy: No palpable adenopathy in the neck, axilla or inguinal regions.  LABORATORY DATA:  I have reviewed the labs as listed.  CBC    Component Value Date/Time   WBC 17.0 (H) 01/11/2018 0817   RBC 5.55 (H) 01/11/2018 0817   HGB 17.4 (H) 01/11/2018 0817   HGB 17.2 (H) 11/30/2017 0841   HCT 51.7 (H) 01/11/2018 0817   HCT 51.4 (H) 11/30/2017 0841   PLT 655 (H) 01/11/2018 0817   PLT 900 (HH) 11/30/2017 0841   MCV 93.2 01/11/2018 0817   MCV 95 11/30/2017 0841   MCH 31.4 01/11/2018 0817   MCHC 33.7 01/11/2018 0817   RDW 16.7 (H) 01/11/2018 0817   RDW 15.7 (H) 11/30/2017 0841   LYMPHSABS 2.2 01/11/2018 0817   LYMPHSABS 2.2 11/30/2017 0841    MONOABS 1.1 (H) 01/11/2018 0817   EOSABS 0.2 01/11/2018 0817   EOSABS 0.2 11/30/2017 0841   BASOSABS 0.1 01/11/2018 0817   BASOSABS 0.1 11/30/2017 0841   CMP Latest Ref Rng & Units 01/11/2018 12/04/2017 11/30/2017  Glucose 65 - 99 mg/dL 77 94 65  BUN 6 - 20 mg/dL 12 11 7(L)  Creatinine 0.44 - 1.00 mg/dL 0.63 0.69 0.67  Sodium 135 -  145 mmol/L 141 140 143  Potassium 3.5 - 5.1 mmol/L 4.0 4.2 6.3(H)  Chloride 101 - 111 mmol/L 105 102 102  CO2 22 - 32 mmol/L 27 25 23   Calcium 8.9 - 10.3 mg/dL 10.2 10.0 10.7(H)  Total Protein 6.5 - 8.1 g/dL 8.2(H) 7.9 7.7  Total Bilirubin 0.3 - 1.2 mg/dL 0.6 0.6 0.3  Alkaline Phos 38 - 126 U/L 100 91 102  AST 15 - 41 U/L 23 22 20   ALT 14 - 54 U/L 19 16 16         ASSESSMENT & PLAN:   Myeloproliferative disorder (HCC) 1.  Jak 2+ myeloproliferative disorder: - She was worked up for thrombocytosis, erythrocytosis and leukocytosis. -She did not have any history of vasomotor symptoms or thrombosis.  Given her age more than 14, she was recommended to start on hydroxyurea.  She started taking Hydrea 500 mg daily on 12/26/2017.  She denies any nausea or mucositis.  No skin ulcers were noted.  She is tolerating it very well.  I have increased her hydroxyurea to 2 tablets daily on 01/11/2018.  She is tolerating the increased dose also very well.  We will plan to repeat her blood counts in 2 to 3 weeks. -Bone marrow aspiration and biopsy on 01/11/2018 shows hypercellular (50 to 70% cellularity) marrow with trilineage hematopoiesis.  Reticulin stain shows mild increase in reticulin fibers.  Chromosome analysis shows 9, XX.  2.  Sleeplessness: -Patient reports that she is unable to sleep for the past few weeks, has a lot of stresses going on in her life.  She reports that she has taken Xanax in the past which help her sleep.  She is taking Xanax 0.25 mg as needed for sleep.  3.  Lung nodules: - Patient has a longtime smoking history.  I have ordered CT scan of the chest  (12/22/2017) screening protocol.  This showed many small pulmonary nodules scattered throughout the lungs bilaterally, there are several other larger nodular areas of architectural distortion, most of which are in the apices.  I discussed the results of the CT scan with the patient.  They have recommended a follow-up scan in 3 months.  We will order a follow-up scan without contrast.      Orders placed this encounter:  Orders Placed This Encounter  Procedures  . Comprehensive metabolic panel  . CBC with Differential  . Lactate dehydrogenase      Derek Jack, MD [place] [phone]

## 2018-01-21 NOTE — Assessment & Plan Note (Signed)
1.  Jak 2+ myeloproliferative disorder: - She was worked up for thrombocytosis, erythrocytosis and leukocytosis. -She did not have any history of vasomotor symptoms or thrombosis.  Given her age more than 32, she was recommended to start on hydroxyurea.  She started taking Hydrea 500 mg daily on 12/26/2017.  She denies any nausea or mucositis.  No skin ulcers were noted.  She is tolerating it very well.  I have increased her hydroxyurea to 2 tablets daily on 01/11/2018.  She is tolerating the increased dose also very well.  We will plan to repeat her blood counts in 2 to 3 weeks. -Bone marrow aspiration and biopsy on 01/11/2018 shows hypercellular (50 to 70% cellularity) marrow with trilineage hematopoiesis.  Reticulin stain shows mild increase in reticulin fibers.  Chromosome analysis shows 12, XX.  2.  Sleeplessness: -Patient reports that she is unable to sleep for the past few weeks, has a lot of stresses going on in her life.  She reports that she has taken Xanax in the past which help her sleep.  She is taking Xanax 0.25 mg as needed for sleep.  3.  Lung nodules: - Patient has a longtime smoking history.  I have ordered CT scan of the chest (12/22/2017) screening protocol.  This showed many small pulmonary nodules scattered throughout the lungs bilaterally, there are several other larger nodular areas of architectural distortion, most of which are in the apices.  I discussed the results of the CT scan with the patient.  They have recommended a follow-up scan in 3 months.  We will order a follow-up scan without contrast.

## 2018-02-16 ENCOUNTER — Inpatient Hospital Stay (HOSPITAL_COMMUNITY): Payer: BLUE CROSS/BLUE SHIELD | Attending: Hematology

## 2018-02-16 DIAGNOSIS — Z1589 Genetic susceptibility to other disease: Secondary | ICD-10-CM | POA: Diagnosis not present

## 2018-02-16 DIAGNOSIS — Z122 Encounter for screening for malignant neoplasm of respiratory organs: Secondary | ICD-10-CM | POA: Diagnosis present

## 2018-02-16 DIAGNOSIS — D471 Chronic myeloproliferative disease: Secondary | ICD-10-CM | POA: Diagnosis not present

## 2018-02-16 DIAGNOSIS — D473 Essential (hemorrhagic) thrombocythemia: Secondary | ICD-10-CM

## 2018-02-16 DIAGNOSIS — D75839 Thrombocytosis, unspecified: Secondary | ICD-10-CM

## 2018-02-16 LAB — COMPREHENSIVE METABOLIC PANEL
ALT: 19 U/L (ref 0–44)
AST: 19 U/L (ref 15–41)
Albumin: 4.1 g/dL (ref 3.5–5.0)
Alkaline Phosphatase: 92 U/L (ref 38–126)
Anion gap: 8 (ref 5–15)
BILIRUBIN TOTAL: 0.8 mg/dL (ref 0.3–1.2)
BUN: 14 mg/dL (ref 8–23)
CHLORIDE: 106 mmol/L (ref 98–111)
CO2: 26 mmol/L (ref 22–32)
CREATININE: 0.62 mg/dL (ref 0.44–1.00)
Calcium: 9.5 mg/dL (ref 8.9–10.3)
GFR calc Af Amer: >60 mL/min (ref >60)
Glucose, Bld: 98 mg/dL (ref 70–99)
POTASSIUM: 4.2 mmol/L (ref 3.5–5.1)
Sodium: 140 mmol/L (ref 135–145)
Total Protein: 7.8 g/dL (ref 6.5–8.1)

## 2018-02-16 LAB — CBC WITH DIFFERENTIAL/PLATELET
Basophils Absolute: 0 10*3/uL (ref 0.0–0.1)
Basophils Relative: 0 %
EOS ABS: 0.1 10*3/uL (ref 0.0–0.7)
EOS PCT: 1 %
HCT: 44.9 % (ref 36.0–46.0)
Hemoglobin: 15.5 g/dL — ABNORMAL HIGH (ref 12.0–15.0)
LYMPHS ABS: 1.7 10*3/uL (ref 0.7–4.0)
LYMPHS PCT: 23 %
MCH: 32.6 pg (ref 26.0–34.0)
MCHC: 34.5 g/dL (ref 30.0–36.0)
MCV: 94.5 fL (ref 78.0–100.0)
MONO ABS: 0.4 10*3/uL (ref 0.1–1.0)
Monocytes Relative: 5 %
Neutro Abs: 5.3 10*3/uL (ref 1.7–7.7)
Neutrophils Relative %: 71 %
PLATELETS: 129 10*3/uL — AB (ref 150–400)
RBC: 4.75 MIL/uL (ref 3.87–5.11)
RDW: 18.7 % — AB (ref 11.5–15.5)
WBC: 7.5 10*3/uL (ref 4.0–10.5)

## 2018-02-16 LAB — LACTATE DEHYDROGENASE: LDH: 161 U/L (ref 98–192)

## 2018-02-18 ENCOUNTER — Inpatient Hospital Stay (HOSPITAL_COMMUNITY): Payer: BLUE CROSS/BLUE SHIELD | Admitting: Hematology

## 2018-02-18 ENCOUNTER — Encounter (HOSPITAL_COMMUNITY): Payer: Self-pay | Admitting: Hematology

## 2018-02-18 ENCOUNTER — Other Ambulatory Visit: Payer: Self-pay

## 2018-02-18 VITALS — BP 120/53 | HR 61 | Temp 98.2°F | Resp 18 | Wt 143.0 lb

## 2018-02-18 DIAGNOSIS — Z1589 Genetic susceptibility to other disease: Secondary | ICD-10-CM

## 2018-02-18 DIAGNOSIS — D471 Chronic myeloproliferative disease: Secondary | ICD-10-CM

## 2018-02-18 DIAGNOSIS — Z122 Encounter for screening for malignant neoplasm of respiratory organs: Secondary | ICD-10-CM | POA: Diagnosis not present

## 2018-02-18 MED ORDER — HYDROXYUREA 500 MG PO CAPS: 1000.0000 mg | ORAL_CAPSULE | Freq: Every day | ORAL | 3 refills | Status: DC

## 2018-02-18 NOTE — Assessment & Plan Note (Signed)
1.  Denise Maynard 1+ myeloproliferative disorder: - She was worked up for thrombocytosis, erythrocytosis and leukocytosis. -She did not have any history of vasomotor symptoms or thrombosis.  Given her age more than 37, she was recommended to start on hydroxyurea.  She started taking Hydrea 500 mg daily on 12/26/2017. She is tolerating it very well.  Hydrea was increased to 2 tablets daily on 01/11/2018.  She is tolerating the increased dose very well.  Today her hemoglobin is 15.5, hematocrit of 44.9 and platelet count of 129. -She would like to switch hydroxyurea to the mornings.  Her hematocrit is staying below 45% and platelet count below 400.  We will continue at the same dose level.  She will come back in 1 month with repeat labs. -Bone marrow aspiration and biopsy on 01/11/2018 shows hypercellular (50 to 70% cellularity) marrow with trilineage hematopoiesis.  Reticulin stain shows mild increase in reticulin fibers.  Chromosome analysis shows 65, XX.  2.  Sleeplessness: -Patient reports that she is unable to sleep for the past few weeks, has a lot of stresses going on in her life.  She reports that she has taken Xanax in the past which help her sleep.  She is taking Xanax 0.25 mg as needed for sleep.  3.  Lung nodules: - Patient has a longtime smoking history.  I have ordered CT scan of the chest (12/22/2017) screening protocol.  This showed many small pulmonary nodules scattered throughout the lungs bilaterally, there are several other larger nodular areas of architectural distortion, most of which are in the apices.  Repeat CT scan was recommended in 3 months.  We will schedule it prior to next visit in 1 month.

## 2018-02-18 NOTE — Progress Notes (Signed)
Denise Maynard, [place] [address]   CLINIC:  Medical Oncology/Hematology  PCP:  Denise Maynard, [place] [address] [phone]   REASON FOR VISIT:  Follow-up for myeloproliferative neoplasm  CURRENT THERAPY: Hydroxyurea 2 tabs daily   INTERVAL HISTORY:  Denise Maynard 62 y.o. female returns for routine follow-up for a myeloproliferative neoplasm. Patient here today with her sister. She asked to take her two pills at breakfast instead of bedtime. Only complaint was lower back pain that is chronic.She is tolerating the hydroxyurea very well. She denies any mucositis or leg ulcers. Denies nausea, vomtining, or diarrhea.Denies any vasomotor or aquagenic pruritus.  Overall, she tells me she has been feeling pretty well. Energy levels 100%; appetite 100%. She still takes xanax as needed for sleep.     REVIEW OF SYSTEMS:  Review of Systems  Musculoskeletal: Positive for back pain (lower).  All other systems reviewed and are negative.    PAST MEDICAL/SURGICAL HISTORY:  Past Medical History:  Diagnosis Date  . Carotid artery aneurysm (HCC)    Coil embolization of a supraclinoid right internal carotid artery - Dr. Kathyrn Sheriff  . Hyperlipidemia   . Nicotine addiction 05/15/2015   Will try patch  . Serum calcium elevated 11/19/2017   recheck  . Serum potassium elevated 11/19/2017  . Stroke [facility])    Past Surgical History:  Procedure Laterality Date  . ABDOMINAL AORTAGRAM N/A 11/24/2014   Procedure: ABDOMINAL Maxcine Ham;  Surgeon: Elam Dutch, MD;  Location: [facility] CATH LAB;  Service: Cardiovascular;  Laterality: N/A;  . Aneursym repair  10/2014  . COLONOSCOPY N/A 07/30/2015   Procedure: COLONOSCOPY;  Surgeon: Danie Binder, MD;  Location: [facility] ENDO SUITE;  Service: Endoscopy;  Laterality: N/A;  1:00 Pm - moved to 1:30 - office to notify  . FRACTURE SURGERY     Jaw  . LOWER EXTREMITY ANGIOGRAM  11/24/2014   Procedure: LOWER  EXTREMITY ANGIOGRAM;  Surgeon: Elam Dutch, MD;  Location: [facility] CATH LAB;  Service: Cardiovascular;;  . MOUTH SURGERY    . RADIOLOGY WITH ANESTHESIA N/A 10/12/2014   Procedure: Embolization/arteriogram;  Surgeon: Consuella Lose, MD;  Location: [facility];  Service: Radiology;  Laterality: N/A;     SOCIAL HISTORY:  Social History   Socioeconomic History  . Marital status: Divorced    Spouse name: Not on file  . Number of children: Not on file  . Years of education: Not on file  . Highest education level: Not on file  Occupational History  . Not on file  Social Needs  . Financial resource strain: Not on file  . Food insecurity:    Worry: Not on file    Inability: Not on file  . Transportation needs:    Medical: Not on file    Non-medical: Not on file  Tobacco Use  . Smoking status: Current Every Day Smoker    Packs/day: 0.50    Years: 30.00    Pack years: 15.00    Types: Cigarettes  . Smokeless tobacco: Never Used  Substance and Sexual Activity  . Alcohol use: No    Alcohol/week: 0.0 oz  . Drug use: No  . Sexual activity: Yes    Birth control/protection: Post-menopausal  Lifestyle  . Physical activity:    Days per week: Not on file    Minutes per session: Not on file  . Stress: Not on file  Relationships  . Social connections:    Talks on phone:  Not on file    Gets together: Not on file    Attends religious service: Not on file    Active member of club or organization: Not on file    Attends meetings of clubs or organizations: Not on file    Relationship status: Not on file  . Intimate partner violence:    Fear of current or ex partner: Not on file    Emotionally abused: Not on file    Physically abused: Not on file    Forced sexual activity: Not on file  Other Topics Concern  . Not on file  Social History Narrative  . Not on file    FAMILY HISTORY:  Family History  Problem Relation Age of Onset  . Heart attack Mother   . Diabetes Mother   . Throat  cancer Father   . Hypertension Father   . Hyperlipidemia Sister   . Diabetes Maternal Grandmother   . Cancer Maternal Grandfather   . Heart attack Paternal Grandfather   . Hyperlipidemia Sister     CURRENT MEDICATIONS:  Outpatient Encounter Medications as of 02/18/2018  Medication Sig  . acetaminophen (TYLENOL) 500 MG tablet Take 1,000 mg by mouth every 6 (six) hours as needed for mild pain or moderate pain.   Marland Kitchen ALPRAZolam (XANAX) 0.25 MG tablet Take 1 tablet (0.25 mg total) by mouth at bedtime as needed for anxiety.  . Artificial Tear Ointment (DRY EYES OP) Apply to eye.  Marland Kitchen aspirin EC 81 MG tablet Take 81 mg by mouth daily.  . cholecalciferol (VITAMIN D) 1000 UNITS tablet Take 1,000 Units by mouth daily.  . hydroxyurea (HYDREA) 500 MG capsule Take 2 capsules (1,000 mg total) by mouth daily. May take with food to minimize GI side effects.  . Multiple Vitamins-Minerals (MULTIVITAMIN WITH MINERALS) tablet Take 1 tablet by mouth daily.  . Omega-3 Fatty Acids (FISH OIL) 1200 MG CAPS Take 1,200 mg by mouth 2 (two) times daily.  . [DISCONTINUED] hydroxyurea (HYDREA) 500 MG capsule Take 2 capsules (1,000 mg total) by mouth daily. May take with food to minimize GI side effects.   No facility-administered encounter medications on file as of 02/18/2018.     ALLERGIES:  No Known Allergies   PHYSICAL EXAM:  ECOG Performance status: 1  Vitals:   02/18/18 1429  BP: (!) 120/53  Pulse: 61  Resp: 18  Temp: 98.2 F (36.8 C)  SpO2: 100%   Filed Weights   02/18/18 1429  Weight: 143 lb (64.9 kg)    Physical Exam   LABORATORY DATA:  I have reviewed the labs as listed.  CBC    Component Value Date/Time   WBC 7.5 02/16/2018 0919   RBC 4.75 02/16/2018 0919   HGB 15.5 (H) 02/16/2018 0919   HGB 17.2 (H) 11/30/2017 0841   HCT 44.9 02/16/2018 0919   HCT 51.4 (H) 11/30/2017 0841   PLT 129 (L) 02/16/2018 0919   PLT 900 (HH) 11/30/2017 0841   MCV 94.5 02/16/2018 0919   MCV 95 11/30/2017  0841   MCH 32.6 02/16/2018 0919   MCHC 34.5 02/16/2018 0919   RDW 18.7 (H) 02/16/2018 0919   RDW 15.7 (H) 11/30/2017 0841   LYMPHSABS 1.7 02/16/2018 0919   LYMPHSABS 2.2 11/30/2017 0841   MONOABS 0.4 02/16/2018 0919   EOSABS 0.1 02/16/2018 0919   EOSABS 0.2 11/30/2017 0841   BASOSABS 0.0 02/16/2018 0919   BASOSABS 0.1 11/30/2017 0841   CMP Latest Ref Rng & Units 02/16/2018 01/11/2018 12/04/2017  Glucose 70 - 99 mg/dL 98 77 94  BUN 8 - 23 mg/dL 14 12 11   Creatinine 0.44 - 1.00 mg/dL 0.62 0.63 0.69  Sodium 135 - 145 mmol/L 140 141 140  Potassium 3.5 - 5.1 mmol/L 4.2 4.0 4.2  Chloride 98 - 111 mmol/L 106 105 102  CO2 22 - 32 mmol/L 26 27 25   Calcium 8.9 - 10.3 mg/dL 9.5 10.2 10.0  Total Protein 6.5 - 8.1 g/dL 7.8 8.2(H) 7.9  Total Bilirubin 0.3 - 1.2 mg/dL 0.8 0.6 0.6  Alkaline Phos 38 - 126 U/L 92 100 91  AST 15 - 41 U/L 19 23 22   ALT 0 - 44 U/L 19 19 16         ASSESSMENT & PLAN:   Myeloproliferative disorder (HCC) 1.  Jak 2+ myeloproliferative disorder: - She was worked up for thrombocytosis, erythrocytosis and leukocytosis. -She did not have any history of vasomotor symptoms or thrombosis.  Given her age more than 36, she was recommended to start on hydroxyurea.  She started taking Hydrea 500 mg daily on 12/26/2017. She is tolerating it very well.  Hydrea was increased to 2 tablets daily on 01/11/2018.  She is tolerating the increased dose very well.  Today her hemoglobin is 15.5, hematocrit of 44.9 and platelet count of 129. -She would like to switch hydroxyurea to the mornings.  Her hematocrit is staying below 45% and platelet count below 400.  We will continue at the same dose level.  She will come back in 1 month with repeat labs. -Bone marrow aspiration and biopsy on 01/11/2018 shows hypercellular (50 to 70% cellularity) marrow with trilineage hematopoiesis.  Reticulin stain shows mild increase in reticulin fibers.  Chromosome analysis shows 49, XX.  2.  Sleeplessness: -Patient  reports that she is unable to sleep for the past few weeks, has a lot of stresses going on in her life.  She reports that she has taken Xanax in the past which help her sleep.  She is taking Xanax 0.25 mg as needed for sleep.  3.  Lung nodules: - Patient has a longtime smoking history.  I have ordered CT scan of the chest (12/22/2017) screening protocol.  This showed many small pulmonary nodules scattered throughout the lungs bilaterally, there are several other larger nodular areas of architectural distortion, most of which are in the apices.  Repeat CT scan was recommended in 3 months.  We will schedule it prior to next visit in 1 month.      Orders placed this encounter:  Orders Placed This Encounter  Procedures  . CT CHEST LCS NODULE F/U W/O CONTRAST  . CBC with Differential/Platelet  . Comprehensive metabolic panel      Derek Jack, MD [place] [phone]

## 2018-03-02 ENCOUNTER — Other Ambulatory Visit (HOSPITAL_COMMUNITY): Payer: Self-pay | Admitting: *Deleted

## 2018-03-02 DIAGNOSIS — Z1589 Genetic susceptibility to other disease: Secondary | ICD-10-CM

## 2018-03-02 MED ORDER — HYDROXYUREA 500 MG PO CAPS: 1000.0000 mg | ORAL_CAPSULE | Freq: Every day | ORAL | 3 refills | Status: DC

## 2018-03-02 NOTE — Telephone Encounter (Signed)
Chart reviewed, hydrea refilled.

## 2018-03-08 ENCOUNTER — Ambulatory Visit (HOSPITAL_COMMUNITY)
Admission: RE | Admit: 2018-03-08 | Discharge: 2018-03-08 | Disposition: A | Discharge status: Home / Self Care | Payer: BLUE CROSS/BLUE SHIELD | Source: Ambulatory Visit | Attending: Nurse Practitioner | Admitting: Nurse Practitioner

## 2018-03-08 DIAGNOSIS — Z122 Encounter for screening for malignant neoplasm of respiratory organs: Secondary | ICD-10-CM

## 2018-03-08 DIAGNOSIS — J432 Centrilobular emphysema: Secondary | ICD-10-CM | POA: Diagnosis not present

## 2018-03-08 DIAGNOSIS — I700 Atherosclerosis of aorta: Secondary | ICD-10-CM | POA: Insufficient documentation

## 2018-03-08 DIAGNOSIS — J438 Other emphysema: Secondary | ICD-10-CM | POA: Insufficient documentation

## 2018-03-08 DIAGNOSIS — I2510 Atherosclerotic heart disease of native coronary artery without angina pectoris: Secondary | ICD-10-CM | POA: Diagnosis not present

## 2018-03-17 ENCOUNTER — Inpatient Hospital Stay (HOSPITAL_COMMUNITY): Payer: BLUE CROSS/BLUE SHIELD | Attending: Hematology

## 2018-03-17 DIAGNOSIS — G4700 Insomnia, unspecified: Secondary | ICD-10-CM | POA: Insufficient documentation

## 2018-03-17 DIAGNOSIS — Z122 Encounter for screening for malignant neoplasm of respiratory organs: Secondary | ICD-10-CM

## 2018-03-17 DIAGNOSIS — F17210 Nicotine dependence, cigarettes, uncomplicated: Secondary | ICD-10-CM | POA: Diagnosis not present

## 2018-03-17 DIAGNOSIS — C946 Myelodysplastic disease, not classified: Secondary | ICD-10-CM | POA: Insufficient documentation

## 2018-03-17 DIAGNOSIS — R918 Other nonspecific abnormal finding of lung field: Secondary | ICD-10-CM | POA: Insufficient documentation

## 2018-03-17 LAB — CBC WITH DIFFERENTIAL/PLATELET
Basophils Absolute: 0 10*3/uL (ref 0.0–0.1)
Basophils Relative: 0 %
EOS ABS: 0 10*3/uL (ref 0.0–0.7)
Eosinophils Relative: 0 %
HEMATOCRIT: 41.1 % (ref 36.0–46.0)
Hemoglobin: 14.3 g/dL (ref 12.0–15.0)
LYMPHS ABS: 2.6 10*3/uL (ref 0.7–4.0)
LYMPHS PCT: 30 %
MCH: 34.1 pg — ABNORMAL HIGH (ref 26.0–34.0)
MCHC: 34.8 g/dL (ref 30.0–36.0)
MCV: 98.1 fL (ref 78.0–100.0)
Monocytes Absolute: 0.7 10*3/uL (ref 0.1–1.0)
Monocytes Relative: 8 %
Neutro Abs: 5.3 10*3/uL (ref 1.7–7.7)
Neutrophils Relative %: 62 %
PLATELETS: 195 10*3/uL (ref 150–400)
RBC: 4.19 MIL/uL (ref 3.87–5.11)
RDW: 21.6 % — ABNORMAL HIGH (ref 11.5–15.5)
WBC: 8.7 10*3/uL (ref 4.0–10.5)

## 2018-03-17 LAB — COMPREHENSIVE METABOLIC PANEL
ALT: 16 U/L (ref 0–44)
AST: 22 U/L (ref 15–41)
Albumin: 4.6 g/dL (ref 3.5–5.0)
Alkaline Phosphatase: 87 U/L (ref 38–126)
Anion gap: 10 (ref 5–15)
BUN: 11 mg/dL (ref 8–23)
CHLORIDE: 104 mmol/L (ref 98–111)
CO2: 24 mmol/L (ref 22–32)
CREATININE: 0.65 mg/dL (ref 0.44–1.00)
Calcium: 9.6 mg/dL (ref 8.9–10.3)
GFR calc Af Amer: >60 mL/min (ref >60)
GLUCOSE: 89 mg/dL (ref 70–99)
Potassium: 3.8 mmol/L (ref 3.5–5.1)
Sodium: 138 mmol/L (ref 135–145)
Total Bilirubin: 0.5 mg/dL (ref 0.3–1.2)
Total Protein: 8.2 g/dL — ABNORMAL HIGH (ref 6.5–8.1)

## 2018-03-18 ENCOUNTER — Telehealth: Payer: Self-pay | Admitting: *Deleted

## 2018-03-18 ENCOUNTER — Inpatient Hospital Stay (HOSPITAL_COMMUNITY): Payer: BLUE CROSS/BLUE SHIELD | Admitting: Hematology

## 2018-03-18 ENCOUNTER — Encounter (HOSPITAL_COMMUNITY): Payer: Self-pay | Admitting: Hematology

## 2018-03-18 ENCOUNTER — Other Ambulatory Visit: Payer: Self-pay

## 2018-03-18 VITALS — BP 120/74 | HR 76 | Resp 16 | Wt 143.4 lb

## 2018-03-18 DIAGNOSIS — D75839 Thrombocytosis, unspecified: Secondary | ICD-10-CM

## 2018-03-18 DIAGNOSIS — R918 Other nonspecific abnormal finding of lung field: Secondary | ICD-10-CM | POA: Diagnosis not present

## 2018-03-18 DIAGNOSIS — D471 Chronic myeloproliferative disease: Secondary | ICD-10-CM

## 2018-03-18 DIAGNOSIS — G4700 Insomnia, unspecified: Secondary | ICD-10-CM | POA: Diagnosis not present

## 2018-03-18 DIAGNOSIS — D473 Essential (hemorrhagic) thrombocythemia: Secondary | ICD-10-CM

## 2018-03-18 DIAGNOSIS — F17210 Nicotine dependence, cigarettes, uncomplicated: Secondary | ICD-10-CM | POA: Diagnosis not present

## 2018-03-18 DIAGNOSIS — C946 Myelodysplastic disease, not classified: Secondary | ICD-10-CM

## 2018-03-18 DIAGNOSIS — Z1589 Genetic susceptibility to other disease: Secondary | ICD-10-CM

## 2018-03-18 NOTE — Progress Notes (Signed)
[address], [place] [address]   CLINIC:  Medical Oncology/Hematology  PCP:  Sharilyn Sites, [place] [address] [phone]   REASON FOR VISIT:  Follow-up for JAK 2+ myeloproliferative neoplasm   CURRENT THERAPY: hydroxyurea 2 tabs daily  INTERVAL HISTORY:  Denise Maynard 62 y.o. female returns for routine follow-up for Jak 2 + myeloproliferative neoplasm. Patient is here today with her friend. She is doing well. States she has been under some stress in the past few weeks however it is improving. She has several areas of bug bites on her chest, back, and bilateral groin areas. They are all healing and scabbed over. No redness or tenderness with them. She is taking her medication as prescribed with no issues. Denies any vasomotor issues. Denies any chest pains or SOB. Denies any new pains. Denies any nausea, vomiting, or diarrhea. Patient lives at home and performs all her own ADLs and activities. Patient appetite and a energy levels are both at 100%. She is maintaining her weight and doing great.    REVIEW OF SYSTEMS:  Review of Systems  Skin: Positive for itching.  All other systems reviewed and are negative.    PAST MEDICAL/SURGICAL HISTORY:  Past Medical History:  Diagnosis Date  . Carotid artery aneurysm (HCC)    Coil embolization of a supraclinoid right internal carotid artery - Dr. Kathyrn Sheriff  . Hyperlipidemia   . Nicotine addiction 05/15/2015   Will try patch  . Serum calcium elevated 11/19/2017   recheck  . Serum potassium elevated 11/19/2017  . Stroke [facility])    Past Surgical History:  Procedure Laterality Date  . ABDOMINAL AORTAGRAM N/A 11/24/2014   Procedure: ABDOMINAL Maxcine Ham;  Surgeon: Elam Dutch, MD;  Location: [facility] CATH LAB;  Service: Cardiovascular;  Laterality: N/A;  . Aneursym repair  10/2014  . COLONOSCOPY N/A 07/30/2015   Procedure: COLONOSCOPY;  Surgeon: Danie Binder, MD;  Location: [facility]  ENDO SUITE;  Service: Endoscopy;  Laterality: N/A;  1:00 Pm - moved to 1:30 - office to notify  . FRACTURE SURGERY     Jaw  . LOWER EXTREMITY ANGIOGRAM  11/24/2014   Procedure: LOWER EXTREMITY ANGIOGRAM;  Surgeon: Elam Dutch, MD;  Location: [facility] CATH LAB;  Service: Cardiovascular;;  . MOUTH SURGERY    . RADIOLOGY WITH ANESTHESIA N/A 10/12/2014   Procedure: Embolization/arteriogram;  Surgeon: Consuella Lose, MD;  Location: [place];  Service: Radiology;  Laterality: N/A;     SOCIAL HISTORY:  Social History   Socioeconomic History  . Marital status: Divorced    Spouse name: Not on file  . Number of children: Not on file  . Years of education: Not on file  . Highest education level: Not on file  Occupational History  . Not on file  Social Needs  . Financial resource strain: Not on file  . Food insecurity:    Worry: Not on file    Inability: Not on file  . Transportation needs:    Medical: Not on file    Non-medical: Not on file  Tobacco Use  . Smoking status: Current Every Day Smoker    Packs/day: 0.50    Years: 30.00    Pack years: 15.00    Types: Cigarettes  . Smokeless tobacco: Never Used  Substance and Sexual Activity  . Alcohol use: No    Alcohol/week: 0.0 standard drinks  . Drug use: No  . Sexual activity: Yes    Birth control/protection:  Post-menopausal  Lifestyle  . Physical activity:    Days per week: Not on file    Minutes per session: Not on file  . Stress: Not on file  Relationships  . Social connections:    Talks on phone: Not on file    Gets together: Not on file    Attends religious service: Not on file    Active member of club or organization: Not on file    Attends meetings of clubs or organizations: Not on file    Relationship status: Not on file  . Intimate partner violence:    Fear of current or ex partner: Not on file    Emotionally abused: Not on file    Physically abused: Not on file    Forced sexual activity: Not on file  Other Topics  Concern  . Not on file  Social History Narrative  . Not on file    FAMILY HISTORY:  Family History  Problem Relation Age of Onset  . Heart attack Mother   . Diabetes Mother   . Throat cancer Father   . Hypertension Father   . Hyperlipidemia Sister   . Diabetes Maternal Grandmother   . Cancer Maternal Grandfather   . Heart attack Paternal Grandfather   . Hyperlipidemia Sister     CURRENT MEDICATIONS:  Outpatient Encounter Medications as of 03/18/2018  Medication Sig  . acetaminophen (TYLENOL) 500 MG tablet Take 1,000 mg by mouth every 6 (six) hours as needed for mild pain or moderate pain.   Marland Kitchen ALPRAZolam (XANAX) 0.25 MG tablet Take 1 tablet (0.25 mg total) by mouth at bedtime as needed for anxiety.  . Artificial Tear Ointment (DRY EYES OP) Apply to eye.  Marland Kitchen aspirin EC 81 MG tablet Take 81 mg by mouth daily.  . cholecalciferol (VITAMIN D) 1000 UNITS tablet Take 2,000 Units by mouth daily.   . hydroxyurea (HYDREA) 500 MG capsule Take 2 capsules (1,000 mg total) by mouth daily. May take with food to minimize GI side effects.  . Multiple Vitamins-Minerals (MULTIVITAMIN WITH MINERALS) tablet Take 1 tablet by mouth daily.  . Omega-3 Fatty Acids (FISH OIL) 1200 MG CAPS Take 1,200 mg by mouth 2 (two) times daily.   No facility-administered encounter medications on file as of 03/18/2018.     ALLERGIES:  No Known Allergies   PHYSICAL EXAM:  ECOG Performance status: 1  Vitals:   03/18/18 1404  BP: 120/74  Pulse: 76  Resp: 16  SpO2: 100%   Filed Weights   03/18/18 1404  Weight: 143 lb 6.4 oz (65 kg)    Physical Exam  Constitutional: She is oriented to person, place, and time. She appears well-developed and well-nourished.  Cardiovascular: Normal rate, regular rhythm and normal heart sounds.  Pulmonary/Chest: Effort normal and breath sounds normal.  Neurological: She is alert and oriented to person, place, and time.  Skin: Skin is warm and dry. Rash (small scabbed bites on  back, chest, biilateral groin) noted.     LABORATORY DATA:  I have reviewed the labs as listed.  CBC    Component Value Date/Time   WBC 8.7 03/17/2018 1223   RBC 4.19 03/17/2018 1223   HGB 14.3 03/17/2018 1223   HGB 17.2 (H) 11/30/2017 0841   HCT 41.1 03/17/2018 1223   HCT 51.4 (H) 11/30/2017 0841   PLT 195 03/17/2018 1223   PLT 900 (HH) 11/30/2017 0841   MCV 98.1 03/17/2018 1223   MCV 95 11/30/2017 0841   MCH 34.1 (H)  03/17/2018 1223   MCHC 34.8 03/17/2018 1223   RDW 21.6 (H) 03/17/2018 1223   RDW 15.7 (H) 11/30/2017 0841   LYMPHSABS 2.6 03/17/2018 1223   LYMPHSABS 2.2 11/30/2017 0841   MONOABS 0.7 03/17/2018 1223   EOSABS 0.0 03/17/2018 1223   EOSABS 0.2 11/30/2017 0841   BASOSABS 0.0 03/17/2018 1223   BASOSABS 0.1 11/30/2017 0841   CMP Latest Ref Rng & Units 03/17/2018 02/16/2018 01/11/2018  Glucose 70 - 99 mg/dL 89 98 77  BUN 8 - 23 mg/dL 11 14 12   Creatinine 0.44 - 1.00 mg/dL 0.65 0.62 0.63  Sodium 135 - 145 mmol/L 138 140 141  Potassium 3.5 - 5.1 mmol/L 3.8 4.2 4.0  Chloride 98 - 111 mmol/L 104 106 105  CO2 22 - 32 mmol/L 24 26 27   Calcium 8.9 - 10.3 mg/dL 9.6 9.5 10.2  Total Protein 6.5 - 8.1 g/dL 8.2(H) 7.8 8.2(H)  Total Bilirubin 0.3 - 1.2 mg/dL 0.5 0.8 0.6  Alkaline Phos 38 - 126 U/L 87 92 100  AST 15 - 41 U/L 22 19 23   ALT 0 - 44 U/L 16 19 19        DIAGNOSTIC IMAGING:  I have reviewed the images of the CT scan of the chest dated 03/08/2018 and discussed with the patient.     ASSESSMENT & PLAN:   Thrombocytosis ([place]) 1.  Jak 2+ myeloproliferative disorder: - She was worked up for thrombocytosis, erythrocytosis and leukocytosis. -She did not have any history of vasomotor symptoms or thrombosis.  Given her age more than 50, she was recommended to start on hydroxyurea.  She started taking Hydrea 500 mg daily on 12/26/2017. She is tolerating it very well.  Hydrea was increased to 2 tablets daily on 01/11/2018.  She is tolerating the increased dose very well.   Today her hemoglobin is 15.5, hematocrit of 44.9 and platelet count of 129. -Bone marrow aspiration and biopsy on 01/11/2018 shows hypercellular (50 to 70% cellularity) marrow with trilineage hematopoiesis.  Reticulin stain shows mild increase in reticulin fibers.  Chromosome analysis shows 67, XX. -She is currently taking hydroxyurea 2 tablets daily.  She is tolerating it very well.  No mucositis was reported.  No nausea was reported.  We went over the blood count from today.  Hematocrit is 41% and platelet count is 195. -I will see her back in 2 months for follow-up with repeat blood work.  2.  Sleeplessness: -Patient reports that she is unable to sleep for the past few weeks, has a lot of stresses going on in her life.  She reports that she has taken Xanax in the past which help her sleep.  She is taking Xanax 0.25 mg as needed for sleep.  3.  Lung nodules: - Patient has a longtime smoking history.  CT scan of the chest on 12/22/2017 showed many small pulmonary nodules scattered throughout the lungs bilaterally. -Repeat CT scan of the chest on 03/08/2018 shows stable lung nodules.  Follow-up CT scan was recommended in 1 year.  I discussed the results with the patient.      Orders placed this encounter:  Orders Placed This Encounter  Procedures  . CBC with Differential/Platelet  . Comprehensive metabolic panel  . Lactate dehydrogenase      Derek Jack, MD [place] [phone]

## 2018-03-18 NOTE — Patient Instructions (Signed)
[facility] at [facility] Discharge Instructions  Follow up in 2 months with labs   Thank you for choosing [facility] at [facility] to provide your oncology and hematology care.  To afford each patient quality time with our provider, please arrive at least 15 minutes before your scheduled appointment time.   If you have a lab appointment with the Cancer Center please come in thru the  Main Entrance and check in at the main information desk  You need to re-schedule your appointment should you arrive 10 or more minutes late.  We strive to give you quality time with our providers, and arriving late affects you and other patients whose appointments are after yours.  Also, if you no show three or more times for appointments you may be dismissed from the clinic at the providers discretion.     Again, thank you for choosing [facility].  Our hope is that these requests will decrease the amount of time that you wait before being seen by our physicians.       _____________________________________________________________  Should you have questions after your visit to [facility], please contact our office at [phone] between the hours of 8:00 a.m. and 4:30 p.m.  Voicemails left after 4:00 p.m. will not be returned until the following business day.  For prescription refill requests, have your pharmacy contact our office and allow 72 hours.    Cancer Center Support Programs:   > Cancer Support Group  2nd Tuesday of the month 1pm-2pm, Journey Room    

## 2018-03-18 NOTE — Assessment & Plan Note (Signed)
1.  Denise Maynard 2+ myeloproliferative disorder: - She was worked up for thrombocytosis, erythrocytosis and leukocytosis. -She did not have any history of vasomotor symptoms or thrombosis.  Given her age more than 43, she was recommended to start on hydroxyurea.  She started taking Hydrea 500 mg daily on 12/26/2017. She is tolerating it very well.  Hydrea was increased to 2 tablets daily on 01/11/2018.  She is tolerating the increased dose very well.  Today her hemoglobin is 15.5, hematocrit of 44.9 and platelet count of 129. -Bone marrow aspiration and biopsy on 01/11/2018 shows hypercellular (50 to 70% cellularity) marrow with trilineage hematopoiesis.  Reticulin stain shows mild increase in reticulin fibers.  Chromosome analysis shows 63, XX. -She is currently taking hydroxyurea 2 tablets daily.  She is tolerating it very well.  No mucositis was reported.  No nausea was reported.  We went over the blood count from today.  Hematocrit is 41% and platelet count is 195. -I will see her back in 2 months for follow-up with repeat blood work.  2.  Sleeplessness: -Patient reports that she is unable to sleep for the past few weeks, has a lot of stresses going on in her life.  She reports that she has taken Xanax in the past which help her sleep.  She is taking Xanax 0.25 mg as needed for sleep.  3.  Lung nodules: - Patient has a longtime smoking history.  CT scan of the chest on 12/22/2017 showed many small pulmonary nodules scattered throughout the lungs bilaterally. -Repeat CT scan of the chest on 03/08/2018 shows stable lung nodules.  Follow-up CT scan was recommended in 1 year.  I discussed the results with the patient.

## 2018-03-22 NOTE — Telephone Encounter (Signed)
Left message that I got her message, and glad she is good

## 2018-05-17 ENCOUNTER — Inpatient Hospital Stay (HOSPITAL_COMMUNITY): Payer: BLUE CROSS/BLUE SHIELD | Attending: Hematology

## 2018-05-17 DIAGNOSIS — R918 Other nonspecific abnormal finding of lung field: Secondary | ICD-10-CM | POA: Diagnosis not present

## 2018-05-17 DIAGNOSIS — D45 Polycythemia vera: Secondary | ICD-10-CM | POA: Diagnosis present

## 2018-05-17 DIAGNOSIS — D471 Chronic myeloproliferative disease: Secondary | ICD-10-CM

## 2018-05-17 DIAGNOSIS — Z1589 Genetic susceptibility to other disease: Secondary | ICD-10-CM

## 2018-05-17 LAB — CBC WITH DIFFERENTIAL/PLATELET
BASOS PCT: 0 %
Basophils Absolute: 0 10*3/uL (ref 0.0–0.1)
Eosinophils Absolute: 0 10*3/uL (ref 0.0–0.7)
Eosinophils Relative: 0 %
HEMATOCRIT: 36.4 % (ref 36.0–46.0)
HEMOGLOBIN: 12.3 g/dL (ref 12.0–15.0)
LYMPHS PCT: 20 %
Lymphs Abs: 1.8 10*3/uL (ref 0.7–4.0)
MCH: 40.3 pg — AB (ref 26.0–34.0)
MCHC: 33.8 g/dL (ref 30.0–36.0)
MCV: 119.3 fL — ABNORMAL HIGH (ref 78.0–100.0)
MONO ABS: 0.5 10*3/uL (ref 0.1–1.0)
MONOS PCT: 6 %
NEUTROS ABS: 6.6 10*3/uL (ref 1.7–7.7)
Neutrophils Relative %: 74 %
Platelets: 157 10*3/uL (ref 150–400)
RBC: 3.05 MIL/uL — ABNORMAL LOW (ref 3.87–5.11)
RDW: 18.9 % — ABNORMAL HIGH (ref 11.5–15.5)
WBC: 9 10*3/uL (ref 4.0–10.5)

## 2018-05-17 LAB — COMPREHENSIVE METABOLIC PANEL
ALK PHOS: 79 U/L (ref 38–126)
ALT: 14 U/L (ref 0–44)
ANION GAP: 10 (ref 5–15)
AST: 19 U/L (ref 15–41)
Albumin: 4.5 g/dL (ref 3.5–5.0)
BUN: 8 mg/dL (ref 8–23)
CALCIUM: 9.4 mg/dL (ref 8.9–10.3)
CO2: 24 mmol/L (ref 22–32)
Chloride: 105 mmol/L (ref 98–111)
Creatinine, Ser: 0.59 mg/dL (ref 0.44–1.00)
GFR calc Af Amer: >60 mL/min (ref >60)
GFR calc non Af Amer: >60 mL/min (ref >60)
Glucose, Bld: 92 mg/dL (ref 70–99)
Potassium: 4 mmol/L (ref 3.5–5.1)
Sodium: 139 mmol/L (ref 135–145)
Total Bilirubin: 0.7 mg/dL (ref 0.3–1.2)
Total Protein: 7.9 g/dL (ref 6.5–8.1)

## 2018-05-17 LAB — LACTATE DEHYDROGENASE: LDH: 181 U/L (ref 98–192)

## 2018-05-17 NOTE — Progress Notes (Signed)
Denise Maynard,  [id number]   CLINIC:  Medical Oncology/Hematology  PCP:  Sharilyn Sites, [place] [address] [phone]   REASON FOR VISIT: Follow-up for JAK 2+ myeloproliferative neoplasm  CURRENT THERAPY: Hydroxyurea 2 tabs daily   INTERVAL HISTORY:  Denise Maynard 62 y.o. female returns for routine follow-up for JAK 2+ myeloproliferative neoplasm. Patient has no complaint at this time. She is taking her medication as prescribed and is having no issues with it. She had some mouth soreness from some medication she took and it is better now. She denies any sores on her legs and will notifiy [place] if this happens. She has mild itching after shower but isn't every time. She reports her appetite and energy level at 100%. She denies any headaches or vision changes. Denies any easy bleeding or bruising. Denies any nausea, vomiting, or diarrhea. Denies any new pains.     REVIEW OF SYSTEMS:  Review of Systems  All other systems reviewed and are negative.    PAST MEDICAL/SURGICAL HISTORY:  Past Medical History:  Diagnosis Date  . Carotid artery aneurysm (HCC)    Coil embolization of a supraclinoid right internal carotid artery - Dr. Kathyrn Sheriff  . Hyperlipidemia   . Nicotine addiction 05/15/2015   Will try patch  . Serum calcium elevated 11/19/2017   recheck  . Serum potassium elevated 11/19/2017  . Stroke [facility])    Past Surgical History:  Procedure Laterality Date  . ABDOMINAL AORTAGRAM N/A 11/24/2014   Procedure: ABDOMINAL Maxcine Ham;  Surgeon: Elam Dutch, MD;  Location: [facility] CATH LAB;  Service: Cardiovascular;  Laterality: N/A;  . Aneursym repair  10/2014  . COLONOSCOPY N/A 07/30/2015   Procedure: COLONOSCOPY;  Surgeon: Danie Binder, MD;  Location: [facility] ENDO SUITE;  Service: Endoscopy;  Laterality: N/A;  1:00 Pm - moved to 1:30 - office to notify  . FRACTURE SURGERY     Jaw  . LOWER EXTREMITY ANGIOGRAM  11/24/2014     Procedure: LOWER EXTREMITY ANGIOGRAM;  Surgeon: Elam Dutch, MD;  Location: [facility] CATH LAB;  Service: Cardiovascular;;  . MOUTH SURGERY    . RADIOLOGY WITH ANESTHESIA N/A 10/12/2014   Procedure: Embolization/arteriogram;  Surgeon: Consuella Lose, MD;  Location: [place];  Service: Radiology;  Laterality: N/A;     SOCIAL HISTORY:  Social History   Socioeconomic History  . Marital status: Divorced    Spouse name: Not on file  . Number of children: Not on file  . Years of education: Not on file  . Highest education level: Not on file  Occupational History  . Not on file  Social Needs  . Financial resource strain: Not on file  . Food insecurity:    Worry: Not on file    Inability: Not on file  . Transportation needs:    Medical: Not on file    Non-medical: Not on file  Tobacco Use  . Smoking status: Current Every Day Smoker    Packs/day: 0.50    Years: 30.00    Pack years: 15.00    Types: Cigarettes  . Smokeless tobacco: Never Used  Substance and Sexual Activity  . Alcohol use: No    Alcohol/week: 0.0 standard drinks  . Drug use: No  . Sexual activity: Yes    Birth control/protection: Post-menopausal  Lifestyle  . Physical activity:    Days per week: Not on file    Minutes per session: Not on file  . Stress:  Not on file  Relationships  . Social connections:    Talks on phone: Not on file    Gets together: Not on file    Attends religious service: Not on file    Active member of club or organization: Not on file    Attends meetings of clubs or organizations: Not on file    Relationship status: Not on file  . Intimate partner violence:    Fear of current or ex partner: Not on file    Emotionally abused: Not on file    Physically abused: Not on file    Forced sexual activity: Not on file  Other Topics Concern  . Not on file  Social History Narrative  . Not on file    FAMILY HISTORY:  Family History  Problem Relation Age of Onset  . Heart attack Mother    . Diabetes Mother   . Throat cancer Father   . Hypertension Father   . Hyperlipidemia Sister   . Diabetes Maternal Grandmother   . Cancer Maternal Grandfather   . Heart attack Paternal Grandfather   . Hyperlipidemia Sister     CURRENT MEDICATIONS:  Outpatient Encounter Medications as of 05/18/2018  Medication Sig  . acetaminophen (TYLENOL) 500 MG tablet Take 1,000 mg by mouth every 6 (six) hours as needed for mild pain or moderate pain.   Marland Kitchen ALPRAZolam (XANAX) 0.25 MG tablet Take 1 tablet (0.25 mg total) by mouth at bedtime as needed for anxiety.  . Artificial Tear Ointment (DRY EYES OP) Apply to eye.  Marland Kitchen aspirin EC 81 MG tablet Take 81 mg by mouth daily.  . cholecalciferol (VITAMIN D) 1000 UNITS tablet Take 2,000 Units by mouth daily.   . hydroxyurea (HYDREA) 500 MG capsule Take 2 capsules (1,000 mg total) by mouth daily. May take with food to minimize GI side effects.  . Multiple Vitamins-Minerals (MULTIVITAMIN WITH MINERALS) tablet Take 1 tablet by mouth daily.  . Omega-3 Fatty Acids (FISH OIL) 1200 MG CAPS Take 1,200 mg by mouth 2 (two) times daily.   No facility-administered encounter medications on file as of 05/18/2018.     ALLERGIES:  No Known Allergies   PHYSICAL EXAM:  ECOG Performance status: 1  Vitals:   05/18/18 1134  BP: 122/62  Pulse: 60  Resp: 18  Temp: 98.3 F (36.8 C)  SpO2: 100%   Filed Weights   05/18/18 1134  Weight: 140 lb (63.5 kg)    Physical Exam  Constitutional: She is oriented to person, place, and time. She appears well-developed and well-nourished.  Musculoskeletal: Normal range of motion.  Neurological: She is alert and oriented to person, place, and time.  Skin: Skin is warm and dry.  Psychiatric: She has a normal mood and affect. Her behavior is normal. Judgment and thought content normal.  Abdomen: No palpable hepatospleno megaly.   LABORATORY DATA:  I have reviewed the labs as listed.  CBC    Component Value Date/Time   WBC  9.0 05/17/2018 1001   RBC 3.05 (L) 05/17/2018 1001   HGB 12.3 05/17/2018 1001   HGB 17.2 (H) 11/30/2017 0841   HCT 36.4 05/17/2018 1001   HCT 51.4 (H) 11/30/2017 0841   PLT 157 05/17/2018 1001   PLT 900 (HH) 11/30/2017 0841   MCV 119.3 (H) 05/17/2018 1001   MCV 95 11/30/2017 0841   MCH 40.3 (H) 05/17/2018 1001   MCHC 33.8 05/17/2018 1001   RDW 18.9 (H) 05/17/2018 1001   RDW 15.7 (H) 11/30/2017 2119  LYMPHSABS 1.8 05/17/2018 1001   LYMPHSABS 2.2 11/30/2017 0841   MONOABS 0.5 05/17/2018 1001   EOSABS 0.0 05/17/2018 1001   EOSABS 0.2 11/30/2017 0841   BASOSABS 0.0 05/17/2018 1001   BASOSABS 0.1 11/30/2017 0841   CMP Latest Ref Rng & Units 05/17/2018 03/17/2018 02/16/2018  Glucose 70 - 99 mg/dL 92 89 98  BUN 8 - 23 mg/dL 8 11 14   Creatinine 0.44 - 1.00 mg/dL 0.59 0.65 0.62  Sodium 135 - 145 mmol/L 139 138 140  Potassium 3.5 - 5.1 mmol/L 4.0 3.8 4.2  Chloride 98 - 111 mmol/L 105 104 106  CO2 22 - 32 mmol/L 24 24 26   Calcium 8.9 - 10.3 mg/dL 9.4 9.6 9.5  Total Protein 6.5 - 8.1 g/dL 7.9 8.2(H) 7.8  Total Bilirubin 0.3 - 1.2 mg/dL 0.7 0.5 0.8  Alkaline Phos 38 - 126 U/L 79 87 92  AST 15 - 41 U/L 19 22 19   ALT 0 - 44 U/L 14 16 19          ASSESSMENT & PLAN:   Myeloproliferative disorder (HCC) 1.  Jak 2+ myeloproliferative disorder, polycythemia vera: - She was worked up for thrombocytosis, erythrocytosis and leukocytosis. -She did not have any history of vasomotor symptoms or thrombosis.  Given her age more than 87, she was recommended to start on hydroxyurea.  She started taking Hydrea 500 mg daily on 12/26/2017. She is tolerating it very well.  Hydrea was increased to 2 tablets daily on 01/11/2018.  She is tolerating the increased dose very well.  Today her hemoglobin is 15.5, hematocrit of 44.9 and platelet count of 129. -Bone marrow aspiration and biopsy on 01/11/2018 shows hypercellular (50 to 70% cellularity) marrow with trilineage hematopoiesis.  Reticulin stain shows mild  increase in reticulin fibers.  Chromosome analysis shows 86, XX. -She is currently taking hydroxyurea 2 tablets daily.  She denies any mucositis, nausea or leg ulcers.  No fevers or infections were reported. -We discussed the results of the blood work from today.  Hematocrit is staying below 45% and platelet count at 157.  No palpable splenomegaly was noted. -We will see her back in 2 months for follow-up with repeat blood work.  2.  Sleeplessness: -She is taking Xanax 0.25 mg as needed for sleep which is helping.  3.  Lung nodules: - Patient has a longtime smoking history.  CT scan of the chest on 12/22/2017 showed many small pulmonary nodules scattered throughout the lungs bilaterally. -Repeat CT scan of the chest on 03/08/2018 shows stable lung nodules.  Follow-up CT scan was recommended in 1 year.  I discussed the results with the patient.      Orders placed this encounter:  No orders of the defined types were placed in this encounter.     Derek Jack, MD [place] [phone]

## 2018-05-18 ENCOUNTER — Encounter (HOSPITAL_COMMUNITY): Payer: Self-pay | Admitting: Hematology

## 2018-05-18 ENCOUNTER — Inpatient Hospital Stay (HOSPITAL_COMMUNITY): Payer: BLUE CROSS/BLUE SHIELD | Admitting: Hematology

## 2018-05-18 DIAGNOSIS — D471 Chronic myeloproliferative disease: Secondary | ICD-10-CM

## 2018-05-18 DIAGNOSIS — R918 Other nonspecific abnormal finding of lung field: Secondary | ICD-10-CM | POA: Diagnosis not present

## 2018-05-18 DIAGNOSIS — D45 Polycythemia vera: Secondary | ICD-10-CM | POA: Diagnosis not present

## 2018-05-18 NOTE — Assessment & Plan Note (Signed)
1.  Jak 2+ myeloproliferative disorder, polycythemia vera: - She was worked up for thrombocytosis, erythrocytosis and leukocytosis. -She did not have any history of vasomotor symptoms or thrombosis.  Given her age more than 35, she was recommended to start on hydroxyurea.  She started taking Hydrea 500 mg daily on 12/26/2017. She is tolerating it very well.  Hydrea was increased to 2 tablets daily on 01/11/2018.  She is tolerating the increased dose very well.  Today her hemoglobin is 15.5, hematocrit of 44.9 and platelet count of 129. -Bone marrow aspiration and biopsy on 01/11/2018 shows hypercellular (50 to 70% cellularity) marrow with trilineage hematopoiesis.  Reticulin stain shows mild increase in reticulin fibers.  Chromosome analysis shows 85, XX. -She is currently taking hydroxyurea 2 tablets daily.  She denies any mucositis, nausea or leg ulcers.  No fevers or infections were reported. -We discussed the results of the blood work from today.  Hematocrit is staying below 45% and platelet count at 157.  No palpable splenomegaly was noted. -We will see her back in 2 months for follow-up with repeat blood work.  2.  Sleeplessness: -She is taking Xanax 0.25 mg as needed for sleep which is helping.  3.  Lung nodules: - Patient has a longtime smoking history.  CT scan of the chest on 12/22/2017 showed many small pulmonary nodules scattered throughout the lungs bilaterally. -Repeat CT scan of the chest on 03/08/2018 shows stable lung nodules.  Follow-up CT scan was recommended in 1 year.  I discussed the results with the patient.

## 2018-05-18 NOTE — Patient Instructions (Signed)
[facility] at [facility]  Discharge Instructions:   _______________________________________________________________  Thank you for choosing [facility] at [facility] to provide your oncology and hematology care.  To afford each patient quality time with our providers, please arrive at least 15 minutes before your scheduled appointment.  You need to re-schedule your appointment if you arrive 10 or more minutes late.  We strive to give you quality time with our providers, and arriving late affects you and other patients whose appointments are after yours.  Also, if you no show three or more times for appointments you may be dismissed from the clinic.  Again, thank you for choosing [facility] at [facility]. Our hope is that these requests will allow you access to exceptional care and in a timely manner. _______________________________________________________________  If you have questions after your visit, please contact our office at [phone] between the hours of 8:30 a.m. and 5:00 p.m. Voicemails left after 4:30 p.m. will not be returned until the following business day. _______________________________________________________________  For prescription refill requests, have your pharmacy contact our office. _______________________________________________________________  Recommendations made by the consultant and any test results will be sent to your referring physician. _______________________________________________________________ 

## 2018-06-18 ENCOUNTER — Other Ambulatory Visit (HOSPITAL_COMMUNITY): Payer: Self-pay | Admitting: *Deleted

## 2018-06-18 DIAGNOSIS — Z1589 Genetic susceptibility to other disease: Secondary | ICD-10-CM

## 2018-06-18 MED ORDER — HYDROXYUREA 500 MG PO CAPS: 1000.0000 mg | ORAL_CAPSULE | Freq: Every day | ORAL | 3 refills | Status: DC

## 2018-07-07 ENCOUNTER — Other Ambulatory Visit (HOSPITAL_COMMUNITY): Payer: Self-pay | Admitting: Physician Assistant

## 2018-07-07 DIAGNOSIS — R1030 Lower abdominal pain, unspecified: Secondary | ICD-10-CM

## 2018-07-15 ENCOUNTER — Ambulatory Visit (HOSPITAL_COMMUNITY)
Admission: RE | Admit: 2018-07-15 | Discharge: 2018-07-15 | Disposition: A | Discharge status: Home / Self Care | Payer: BLUE CROSS/BLUE SHIELD | Source: Ambulatory Visit | Attending: Physician Assistant | Admitting: Physician Assistant

## 2018-07-15 DIAGNOSIS — R1030 Lower abdominal pain, unspecified: Secondary | ICD-10-CM | POA: Insufficient documentation

## 2018-07-16 ENCOUNTER — Other Ambulatory Visit (HOSPITAL_COMMUNITY): Payer: Self-pay | Admitting: *Deleted

## 2018-07-16 DIAGNOSIS — D75839 Thrombocytosis, unspecified: Secondary | ICD-10-CM

## 2018-07-16 DIAGNOSIS — D473 Essential (hemorrhagic) thrombocythemia: Secondary | ICD-10-CM

## 2018-07-19 ENCOUNTER — Inpatient Hospital Stay (HOSPITAL_COMMUNITY): Payer: BLUE CROSS/BLUE SHIELD | Attending: Hematology

## 2018-07-19 DIAGNOSIS — D45 Polycythemia vera: Secondary | ICD-10-CM | POA: Insufficient documentation

## 2018-07-19 DIAGNOSIS — D473 Essential (hemorrhagic) thrombocythemia: Secondary | ICD-10-CM

## 2018-07-19 DIAGNOSIS — D75839 Thrombocytosis, unspecified: Secondary | ICD-10-CM

## 2018-07-19 DIAGNOSIS — R918 Other nonspecific abnormal finding of lung field: Secondary | ICD-10-CM | POA: Diagnosis not present

## 2018-07-19 LAB — COMPREHENSIVE METABOLIC PANEL
ALK PHOS: 78 U/L (ref 38–126)
ALT: 15 U/L (ref 0–44)
ANION GAP: 10 (ref 5–15)
AST: 27 U/L (ref 15–41)
Albumin: 4.8 g/dL (ref 3.5–5.0)
BUN: 11 mg/dL (ref 8–23)
CALCIUM: 9.6 mg/dL (ref 8.9–10.3)
CO2: 22 mmol/L (ref 22–32)
Chloride: 106 mmol/L (ref 98–111)
Creatinine, Ser: 0.62 mg/dL (ref 0.44–1.00)
GFR calc non Af Amer: >60 mL/min (ref >60)
Glucose, Bld: 89 mg/dL (ref 70–99)
Potassium: 4.4 mmol/L (ref 3.5–5.1)
SODIUM: 138 mmol/L (ref 135–145)
Total Bilirubin: 1 mg/dL (ref 0.3–1.2)
Total Protein: 8.1 g/dL (ref 6.5–8.1)

## 2018-07-19 LAB — CBC WITH DIFFERENTIAL/PLATELET
Abs Immature Granulocytes: 0.01 10*3/uL (ref 0.00–0.07)
Basophils Absolute: 0 10*3/uL (ref 0.0–0.1)
Basophils Relative: 0 %
Eosinophils Absolute: 0 10*3/uL (ref 0.0–0.5)
Eosinophils Relative: 1 %
HCT: 40.2 % (ref 36.0–46.0)
Hemoglobin: 13.7 g/dL (ref 12.0–15.0)
Immature Granulocytes: 0 %
Lymphocytes Relative: 36 %
Lymphs Abs: 2.1 10*3/uL (ref 0.7–4.0)
MCH: 43.1 pg — ABNORMAL HIGH (ref 26.0–34.0)
MCHC: 34.1 g/dL (ref 30.0–36.0)
MCV: 126.4 fL — ABNORMAL HIGH (ref 80.0–100.0)
Monocytes Absolute: 0.2 10*3/uL (ref 0.1–1.0)
Monocytes Relative: 4 %
Neutro Abs: 3.5 10*3/uL (ref 1.7–7.7)
Neutrophils Relative %: 59 %
Platelets: 77 10*3/uL — ABNORMAL LOW (ref 150–400)
RBC: 3.18 MIL/uL — ABNORMAL LOW (ref 3.87–5.11)
RDW: 13.2 % (ref 11.5–15.5)
WBC: 5.9 10*3/uL (ref 4.0–10.5)
nRBC: 0 % (ref 0.0–0.2)

## 2018-07-19 LAB — LACTATE DEHYDROGENASE: LDH: 279 U/L — ABNORMAL HIGH (ref 98–192)

## 2018-07-22 ENCOUNTER — Inpatient Hospital Stay (HOSPITAL_COMMUNITY): Payer: BLUE CROSS/BLUE SHIELD | Admitting: Hematology

## 2018-07-22 ENCOUNTER — Other Ambulatory Visit: Payer: Self-pay

## 2018-07-22 ENCOUNTER — Encounter (HOSPITAL_COMMUNITY): Payer: Self-pay | Admitting: Hematology

## 2018-07-22 VITALS — BP 139/69 | HR 63 | Temp 98.4°F | Resp 16 | Wt 141.2 lb

## 2018-07-22 DIAGNOSIS — R918 Other nonspecific abnormal finding of lung field: Secondary | ICD-10-CM

## 2018-07-22 DIAGNOSIS — D471 Chronic myeloproliferative disease: Secondary | ICD-10-CM

## 2018-07-22 DIAGNOSIS — D473 Essential (hemorrhagic) thrombocythemia: Secondary | ICD-10-CM

## 2018-07-22 DIAGNOSIS — D45 Polycythemia vera: Secondary | ICD-10-CM | POA: Diagnosis not present

## 2018-07-22 DIAGNOSIS — D75839 Thrombocytosis, unspecified: Secondary | ICD-10-CM

## 2018-07-22 NOTE — Assessment & Plan Note (Signed)
1.  Jak 2+ myeloproliferative disorder, polycythemia vera: - She was worked up for thrombocytosis, erythrocytosis and leukocytosis. -She did not have any history of vasomotor symptoms or thrombosis.  Given her age more than 32, she was recommended to start on hydroxyurea.  She started taking Hydrea 500 mg daily on 12/26/2017. She is tolerating it very well.  Hydrea was increased to 2 tablets daily on 01/11/2018.  She is tolerating the increased dose very well.  Today her hemoglobin is 15.5, hematocrit of 44.9 and platelet count of 129. -Bone marrow aspiration and biopsy on 01/11/2018 shows hypercellular (50 to 70% cellularity) marrow with trilineage hematopoiesis.  Reticulin stain shows mild increase in reticulin fibers.  Chromosome analysis shows 39, XX. - She is taking 2 tablets of Hydrea daily.  She does not report any leg ulcers or nausea.  She does not have any vasomotor symptoms or erythromelalgia. - We reviewed blood work from today.  Hematocrit is staying below 45%.  Platelet count dropped to 72.  She denies any bleeding or easy bruising. - I have recommended her to cut back on Hydrea to 2 tablets on Mondays, Wednesdays and Fridays, and 1 tablet all other days. -I plan to check a CBC in 4 weeks. -We will see her back in 2 months for follow-up.  2.  Sleeplessness: -She is taking Xanax 0.25 mg as needed for sleep which is helping.  3.  Lung nodules: - Patient has a longtime smoking history.  CT scan of the chest on 12/22/2017 showed many small pulmonary nodules scattered throughout the lungs bilaterally. -Repeat CT scan of the chest on 03/08/2018 shows stable lung nodules.  Follow-up CT scan was recommended in 1 year.  I discussed the results with the patient.

## 2018-07-22 NOTE — Progress Notes (Signed)
[place], [place] [address]   CLINIC:  Medical Oncology/Hematology  PCP:  Sharilyn Sites, [place] [address] [phone]   REASON FOR VISIT: Follow-up for JAK 2+ myeloproliferative neoplasm  CURRENT THERAPY: Hydroxyurea 2 tabs M,W,F every other day take 1 tab   INTERVAL HISTORY:  Denise Maynard 62 y.o. female returns for routine follow-up for JAK 2+ myeloproliferative neoplasm. She is here today with her sister. She is doing well and tolerating the hydrourea well. She denies any sores on her legs or mouth sores. Denies any nausea, vomiting, or diarrhea. Denies any new pains. Denies any headaches or bleeding problems. She reports her appetite and energy level at 75%. She lives alone and performs all her own ADLs and activities. She has no problem maintaining her weight.     REVIEW OF SYSTEMS:  Review of Systems  Psychiatric/Behavioral: Positive for depression.  All other systems reviewed and are negative.    PAST MEDICAL/SURGICAL HISTORY:  Past Medical History:  Diagnosis Date  . Carotid artery aneurysm (HCC)    Coil embolization of a supraclinoid right internal carotid artery - Dr. Kathyrn Sheriff  . Hyperlipidemia   . Nicotine addiction 05/15/2015   Will try patch  . Serum calcium elevated 11/19/2017   recheck  . Serum potassium elevated 11/19/2017  . Stroke [facility])    Past Surgical History:  Procedure Laterality Date  . ABDOMINAL AORTAGRAM N/A 11/24/2014   Procedure: ABDOMINAL Maxcine Ham;  Surgeon: Elam Dutch, MD;  Location: [facility] CATH LAB;  Service: Cardiovascular;  Laterality: N/A;  . Aneursym repair  10/2014  . COLONOSCOPY N/A 07/30/2015   Procedure: COLONOSCOPY;  Surgeon: Danie Binder, MD;  Location: [facility] ENDO SUITE;  Service: Endoscopy;  Laterality: N/A;  1:00 Pm - moved to 1:30 - office to notify  . FRACTURE SURGERY     Jaw  . LOWER EXTREMITY ANGIOGRAM  11/24/2014   Procedure: LOWER EXTREMITY ANGIOGRAM;   Surgeon: Elam Dutch, MD;  Location: [facility] CATH LAB;  Service: Cardiovascular;;  . MOUTH SURGERY    . RADIOLOGY WITH ANESTHESIA N/A 10/12/2014   Procedure: Embolization/arteriogram;  Surgeon: Consuella Lose, MD;  Location: [facility];  Service: Radiology;  Laterality: N/A;     SOCIAL HISTORY:  Social History   Socioeconomic History  . Marital status: Divorced    Spouse name: Not on file  . Number of children: Not on file  . Years of education: Not on file  . Highest education level: Not on file  Occupational History  . Not on file  Social Needs  . Financial resource strain: Not on file  . Food insecurity:    Worry: Not on file    Inability: Not on file  . Transportation needs:    Medical: Not on file    Non-medical: Not on file  Tobacco Use  . Smoking status: Current Every Day Smoker    Packs/day: 0.50    Years: 30.00    Pack years: 15.00    Types: Cigarettes  . Smokeless tobacco: Never Used  Substance and Sexual Activity  . Alcohol use: No    Alcohol/week: 0.0 standard drinks  . Drug use: No  . Sexual activity: Yes    Birth control/protection: Post-menopausal  Lifestyle  . Physical activity:    Days per week: Not on file    Minutes per session: Not on file  . Stress: Not on file  Relationships  . Social connections:    Talks  on phone: Not on file    Gets together: Not on file    Attends religious service: Not on file    Active member of club or organization: Not on file    Attends meetings of clubs or organizations: Not on file    Relationship status: Not on file  . Intimate partner violence:    Fear of current or ex partner: Not on file    Emotionally abused: Not on file    Physically abused: Not on file    Forced sexual activity: Not on file  Other Topics Concern  . Not on file  Social History Narrative  . Not on file    FAMILY HISTORY:  Family History  Problem Relation Age of Onset  . Heart attack Mother   . Diabetes Mother   . Throat cancer  Father   . Hypertension Father   . Hyperlipidemia Sister   . Diabetes Maternal Grandmother   . Cancer Maternal Grandfather   . Heart attack Paternal Grandfather   . Hyperlipidemia Sister     CURRENT MEDICATIONS:  Outpatient Encounter Medications as of 07/22/2018  Medication Sig  . acetaminophen (TYLENOL) 500 MG tablet Take 1,000 mg by mouth every 6 (six) hours as needed for mild pain or moderate pain.   Marland Kitchen ALPRAZolam (XANAX) 0.25 MG tablet Take 1 tablet (0.25 mg total) by mouth at bedtime as needed for anxiety.  . Artificial Tear Ointment (DRY EYES OP) Apply to eye.  Marland Kitchen aspirin EC 81 MG tablet Take 81 mg by mouth daily.  . cholecalciferol (VITAMIN D) 1000 UNITS tablet Take 2,000 Units by mouth daily.   . hydroxyurea (HYDREA) 500 MG capsule Take 2 capsules (1,000 mg total) by mouth daily. May take with food to minimize GI side effects.  . Multiple Vitamins-Minerals (MULTIVITAMIN WITH MINERALS) tablet Take 1 tablet by mouth daily.  . Omega-3 Fatty Acids (FISH OIL) 1200 MG CAPS Take 1,200 mg by mouth 2 (two) times daily.   No facility-administered encounter medications on file as of 07/22/2018.     ALLERGIES:  No Known Allergies   PHYSICAL EXAM:  ECOG Performance status: 1  Vitals:   07/22/18 1109  BP: 139/69  Pulse: 63  Resp: 16  Temp: 98.4 F (36.9 C)  SpO2: 100%   Filed Weights   07/22/18 1109  Weight: 141 lb 3.2 oz (64 kg)    Physical Exam Constitutional:      Appearance: Normal appearance. She is normal weight.  Musculoskeletal: Normal range of motion.  Skin:    General: Skin is warm and dry.  Neurological:     Mental Status: She is alert and oriented to person, place, and time. Mental status is at baseline.  Psychiatric:        Mood and Affect: Mood normal.        Behavior: Behavior normal.        Thought Content: Thought content normal.        Judgment: Judgment normal.      LABORATORY DATA:  I have reviewed the labs as listed.  CBC    Component  Value Date/Time   WBC 5.9 07/19/2018 1045   RBC 3.18 (L) 07/19/2018 1045   HGB 13.7 07/19/2018 1045   HGB 17.2 (H) 11/30/2017 0841   HCT 40.2 07/19/2018 1045   HCT 51.4 (H) 11/30/2017 0841   PLT 77 (L) 07/19/2018 1045   PLT 900 (HH) 11/30/2017 0841   MCV 126.4 (H) 07/19/2018 1045   MCV 95 11/30/2017  0841   MCH 43.1 (H) 07/19/2018 1045   MCHC 34.1 07/19/2018 1045   RDW 13.2 07/19/2018 1045   RDW 15.7 (H) 11/30/2017 0841   LYMPHSABS 2.1 07/19/2018 1045   LYMPHSABS 2.2 11/30/2017 0841   MONOABS 0.2 07/19/2018 1045   EOSABS 0.0 07/19/2018 1045   EOSABS 0.2 11/30/2017 0841   BASOSABS 0.0 07/19/2018 1045   BASOSABS 0.1 11/30/2017 0841   CMP Latest Ref Rng & Units 07/19/2018 05/17/2018 03/17/2018  Glucose 70 - 99 mg/dL 89 92 89  BUN 8 - 23 mg/dL 11 8 11   Creatinine 0.44 - 1.00 mg/dL 0.62 0.59 0.65  Sodium 135 - 145 mmol/L 138 139 138  Potassium 3.5 - 5.1 mmol/L 4.4 4.0 3.8  Chloride 98 - 111 mmol/L 106 105 104  CO2 22 - 32 mmol/L 22 24 24   Calcium 8.9 - 10.3 mg/dL 9.6 9.4 9.6  Total Protein 6.5 - 8.1 g/dL 8.1 7.9 8.2(H)  Total Bilirubin 0.3 - 1.2 mg/dL 1.0 0.7 0.5  Alkaline Phos 38 - 126 U/L 78 79 87  AST 15 - 41 U/L 27 19 22   ALT 0 - 44 U/L 15 14 16        DIAGNOSTIC IMAGING:  I have independently reviewed the scans and discussed with the patient.   I have reviewed Denise Finders, NP's note and agree with the documentation.  I personally performed a face-to-face visit, made revisions and my assessment and plan is as follows.    ASSESSMENT & PLAN:   Thrombocytosis ([place]) 1.  Jak 2+ myeloproliferative disorder, polycythemia vera: - She was worked up for thrombocytosis, erythrocytosis and leukocytosis. -She did not have any history of vasomotor symptoms or thrombosis.  Given her age more than 27, she was recommended to start on hydroxyurea.  She started taking Hydrea 500 mg daily on 12/26/2017. She is tolerating it very well.  Hydrea was increased to 2 tablets daily on 01/11/2018.   She is tolerating the increased dose very well.  Today her hemoglobin is 15.5, hematocrit of 44.9 and platelet count of 129. -Bone marrow aspiration and biopsy on 01/11/2018 shows hypercellular (50 to 70% cellularity) marrow with trilineage hematopoiesis.  Reticulin stain shows mild increase in reticulin fibers.  Chromosome analysis shows 69, XX. - She is taking 2 tablets of Hydrea daily.  She does not report any leg ulcers or nausea.  She does not have any vasomotor symptoms or erythromelalgia. - We reviewed blood work from today.  Hematocrit is staying below 45%.  Platelet count dropped to 72.  She denies any bleeding or easy bruising. - I have recommended her to cut back on Hydrea to 2 tablets on Mondays, Wednesdays and Fridays, and 1 tablet all other days. -I plan to check a CBC in 4 weeks. -We will see her back in 2 months for follow-up.  2.  Sleeplessness: -She is taking Xanax 0.25 mg as needed for sleep which is helping.  3.  Lung nodules: - Patient has a longtime smoking history.  CT scan of the chest on 12/22/2017 showed many small pulmonary nodules scattered throughout the lungs bilaterally. -Repeat CT scan of the chest on 03/08/2018 shows stable lung nodules.  Follow-up CT scan was recommended in 1 year.  I discussed the results with the patient.      Orders placed this encounter:  Orders Placed This Encounter  Procedures  . CBC with Differential/Platelet  . CBC with Differential/Platelet  . Comprehensive metabolic panel  . Lactate dehydrogenase  Derek Jack, MD [place] [phone]

## 2018-07-22 NOTE — Patient Instructions (Signed)
[place] at [facility] Discharge Instructions  Follow up in 2 months with labs Come have your labs checked in 1 month Take Hydrourea 2 tabs on Monday, Wednesday, Friday and take 1 tab on the other days.   Thank you for choosing [place] at [facility] to provide your oncology and hematology care.  To afford each patient quality time with our provider, please arrive at least 15 minutes before your scheduled appointment time.   If you have a lab appointment with the [facility] please come in thru the  Main Entrance and check in at the main information desk  You need to re-schedule your appointment should you arrive 10 or more minutes late.  We strive to give you quality time with our providers, and arriving late affects you and other patients whose appointments are after yours.  Also, if you no show three or more times for appointments you may be dismissed from the clinic at the providers discretion.     Again, thank you for choosing [facility].  Our hope is that these requests will decrease the amount of time that you wait before being seen by our physicians.       _____________________________________________________________  Should you have questions after your visit to [facility], please contact our office at [phone] between the hours of 8:00 a.m. and 4:30 p.m.  Voicemails left after 4:00 p.m. will not be returned until the following business day.  For prescription refill requests, have your pharmacy contact our office and allow 72 hours.    Cancer Center Support Programs:   > Cancer Support Group  2nd Tuesday of the month 1pm-2pm, Journey Room

## 2018-07-26 ENCOUNTER — Ambulatory Visit (HOSPITAL_COMMUNITY): Payer: BLUE CROSS/BLUE SHIELD | Admitting: Hematology

## 2018-08-23 ENCOUNTER — Inpatient Hospital Stay (HOSPITAL_COMMUNITY): Payer: BLUE CROSS/BLUE SHIELD | Attending: Hematology

## 2018-08-23 DIAGNOSIS — D471 Chronic myeloproliferative disease: Secondary | ICD-10-CM | POA: Insufficient documentation

## 2018-08-23 LAB — CBC WITH DIFFERENTIAL/PLATELET
Abs Immature Granulocytes: 0.04 10*3/uL (ref 0.00–0.07)
Basophils Absolute: 0 10*3/uL (ref 0.0–0.1)
Basophils Relative: 0 %
Eosinophils Absolute: 0 10*3/uL (ref 0.0–0.5)
Eosinophils Relative: 0 %
HEMATOCRIT: 36 % (ref 36.0–46.0)
Hemoglobin: 12 g/dL (ref 12.0–15.0)
Immature Granulocytes: 0 %
Lymphocytes Relative: 24 %
Lymphs Abs: 2.4 10*3/uL (ref 0.7–4.0)
MCH: 41.5 pg — ABNORMAL HIGH (ref 26.0–34.0)
MCHC: 33.3 g/dL (ref 30.0–36.0)
MCV: 124.6 fL — AB (ref 80.0–100.0)
MONO ABS: 0.6 10*3/uL (ref 0.1–1.0)
MONOS PCT: 7 %
Neutro Abs: 6.8 10*3/uL (ref 1.7–7.7)
Neutrophils Relative %: 69 %
Platelets: 229 10*3/uL (ref 150–400)
RBC: 2.89 MIL/uL — ABNORMAL LOW (ref 3.87–5.11)
RDW: 13.5 % (ref 11.5–15.5)
WBC: 9.9 10*3/uL (ref 4.0–10.5)
nRBC: 0 % (ref 0.0–0.2)

## 2018-08-24 ENCOUNTER — Telehealth (HOSPITAL_COMMUNITY): Payer: Self-pay | Admitting: *Deleted

## 2018-08-24 NOTE — Telephone Encounter (Signed)
-----   Message from Derek Jack, MD sent at 08/24/2018  4:11 PM EST ----- Regarding: FW: Lab results Please call her to continue the same dosing.  She is taking hydroxyurea 2 tablets Monday Wednesday Friday and 1 tablet rest of the week. ----- Message ----- From: Veronda Prude Sent: 08/24/2018   2:35 PM EST To: Derek Jack, MD, Farley Ly, LPN, # Subject: Lab results                                    Patient had labs yesterday (08/24/2018) and she is calling for the results.  Per patient, her lab results may effect how she takes Hydrea.  Patient may be reached at  [phone] or [phone]

## 2018-08-24 NOTE — Telephone Encounter (Signed)
Pt aware to continue taking her current dose of Hydrea. Also aware to keep her next appointment as scheduled. Pt verbalized understanding.

## 2018-09-16 ENCOUNTER — Inpatient Hospital Stay (HOSPITAL_COMMUNITY): Payer: BLUE CROSS/BLUE SHIELD | Attending: Hematology

## 2018-09-16 DIAGNOSIS — D45 Polycythemia vera: Secondary | ICD-10-CM | POA: Insufficient documentation

## 2018-09-16 DIAGNOSIS — D473 Essential (hemorrhagic) thrombocythemia: Secondary | ICD-10-CM

## 2018-09-16 DIAGNOSIS — R918 Other nonspecific abnormal finding of lung field: Secondary | ICD-10-CM | POA: Insufficient documentation

## 2018-09-16 DIAGNOSIS — D75839 Thrombocytosis, unspecified: Secondary | ICD-10-CM

## 2018-09-16 DIAGNOSIS — D471 Chronic myeloproliferative disease: Secondary | ICD-10-CM

## 2018-09-16 LAB — CBC WITH DIFFERENTIAL/PLATELET
Abs Immature Granulocytes: 0.03 10*3/uL (ref 0.00–0.07)
BASOS ABS: 0 10*3/uL (ref 0.0–0.1)
BASOS PCT: 0 %
Eosinophils Absolute: 0.1 10*3/uL (ref 0.0–0.5)
Eosinophils Relative: 1 %
HCT: 39.1 % (ref 36.0–46.0)
Hemoglobin: 12.8 g/dL (ref 12.0–15.0)
Immature Granulocytes: 0 %
Lymphocytes Relative: 27 %
Lymphs Abs: 2 10*3/uL (ref 0.7–4.0)
MCH: 40.1 pg — ABNORMAL HIGH (ref 26.0–34.0)
MCHC: 32.7 g/dL (ref 30.0–36.0)
MCV: 122.6 fL — ABNORMAL HIGH (ref 80.0–100.0)
Monocytes Absolute: 0.5 10*3/uL (ref 0.1–1.0)
Monocytes Relative: 7 %
Neutro Abs: 5 10*3/uL (ref 1.7–7.7)
Neutrophils Relative %: 65 %
Platelets: 178 10*3/uL (ref 150–400)
RBC: 3.19 MIL/uL — ABNORMAL LOW (ref 3.87–5.11)
RDW: 12.8 % (ref 11.5–15.5)
WBC: 7.7 10*3/uL (ref 4.0–10.5)
nRBC: 0 % (ref 0.0–0.2)

## 2018-09-16 LAB — COMPREHENSIVE METABOLIC PANEL
ALT: 14 U/L (ref 0–44)
ANION GAP: 9 (ref 5–15)
AST: 17 U/L (ref 15–41)
Albumin: 4.6 g/dL (ref 3.5–5.0)
Alkaline Phosphatase: 75 U/L (ref 38–126)
BUN: 11 mg/dL (ref 8–23)
CO2: 26 mmol/L (ref 22–32)
Calcium: 10.4 mg/dL — ABNORMAL HIGH (ref 8.9–10.3)
Chloride: 106 mmol/L (ref 98–111)
Creatinine, Ser: 0.57 mg/dL (ref 0.44–1.00)
GFR calc Af Amer: >60 mL/min (ref >60)
GFR calc non Af Amer: >60 mL/min (ref >60)
Glucose, Bld: 112 mg/dL — ABNORMAL HIGH (ref 70–99)
Potassium: 5 mmol/L (ref 3.5–5.1)
Sodium: 141 mmol/L (ref 135–145)
Total Bilirubin: 0.5 mg/dL (ref 0.3–1.2)
Total Protein: 7.9 g/dL (ref 6.5–8.1)

## 2018-09-16 LAB — LACTATE DEHYDROGENASE: LDH: 185 U/L (ref 98–192)

## 2018-09-23 ENCOUNTER — Inpatient Hospital Stay (HOSPITAL_BASED_OUTPATIENT_CLINIC_OR_DEPARTMENT_OTHER): Payer: BLUE CROSS/BLUE SHIELD | Admitting: Hematology

## 2018-09-23 ENCOUNTER — Encounter (HOSPITAL_COMMUNITY): Payer: Self-pay | Admitting: Hematology

## 2018-09-23 ENCOUNTER — Other Ambulatory Visit: Payer: Self-pay

## 2018-09-23 VITALS — BP 137/80 | HR 76 | Temp 98.2°F | Resp 16 | Wt 144.1 lb

## 2018-09-23 DIAGNOSIS — D45 Polycythemia vera: Secondary | ICD-10-CM | POA: Diagnosis not present

## 2018-09-23 DIAGNOSIS — R918 Other nonspecific abnormal finding of lung field: Secondary | ICD-10-CM

## 2018-09-23 DIAGNOSIS — D471 Chronic myeloproliferative disease: Secondary | ICD-10-CM

## 2018-09-23 DIAGNOSIS — D75839 Thrombocytosis, unspecified: Secondary | ICD-10-CM

## 2018-09-23 DIAGNOSIS — Z1589 Genetic susceptibility to other disease: Secondary | ICD-10-CM

## 2018-09-23 DIAGNOSIS — D473 Essential (hemorrhagic) thrombocythemia: Secondary | ICD-10-CM

## 2018-09-23 NOTE — Progress Notes (Signed)
Denise Maynard, [place] [address]   CLINIC:  Medical Oncology/Hematology  PCP:  Sharilyn Sites, [place] [address] [phone]   REASON FOR VISIT: Follow-up for JAK 2+ myeloproliferative neoplasm  CURRENT THERAPY: Hydroxyurea 2 tabs M,W,F every other day take 1 tab   INTERVAL HISTORY:  Denise Maynard 63 y.o. female returns for routine follow-up for JAK 2+ myeloproliferative neoplasm. She has been doing well since we decreased her hydrea. She reports no issues. Denies any skin break down or sores on her legs or ankles. Denies any nausea, vomiting, or diarrhea. Denies any new pains. Had not noticed any recent bleeding such as epistaxis, hematuria or hematochezia. Denies recent chest pain on exertion, shortness of breath on minimal exertion, pre-syncopal episodes, or palpitations. Denies any numbness or tingling in hands or feet. Denies any recent fevers, infections, or recent hospitalizations. Patient reports appetite at 100% and energy level at 100%.    REVIEW OF SYSTEMS:  Review of Systems  All other systems reviewed and are negative.    PAST MEDICAL/SURGICAL HISTORY:  Past Medical History:  Diagnosis Date  . Carotid artery aneurysm (HCC)    Coil embolization of a supraclinoid right internal carotid artery - Dr. Kathyrn Sheriff  . Hyperlipidemia   . Nicotine addiction 05/15/2015   Will try patch  . Serum calcium elevated 11/19/2017   recheck  . Serum potassium elevated 11/19/2017  . Stroke [facility])    Past Surgical History:  Procedure Laterality Date  . ABDOMINAL AORTAGRAM N/A 11/24/2014   Procedure: ABDOMINAL Maxcine Ham;  Surgeon: Elam Dutch, MD;  Location: [facility] CATH LAB;  Service: Cardiovascular;  Laterality: N/A;  . Aneursym repair  10/2014  . COLONOSCOPY N/A 07/30/2015   Procedure: COLONOSCOPY;  Surgeon: Danie Binder, MD;  Location: [facility] ENDO SUITE;  Service: Endoscopy;  Laterality: N/A;  1:00 Pm - moved to 1:30 -  office to notify  . FRACTURE SURGERY     Jaw  . LOWER EXTREMITY ANGIOGRAM  11/24/2014   Procedure: LOWER EXTREMITY ANGIOGRAM;  Surgeon: Elam Dutch, MD;  Location: [facility] CATH LAB;  Service: Cardiovascular;;  . MOUTH SURGERY    . RADIOLOGY WITH ANESTHESIA N/A 10/12/2014   Procedure: Embolization/arteriogram;  Surgeon: Consuella Lose, MD;  Location: [place];  Service: Radiology;  Laterality: N/A;     SOCIAL HISTORY:  Social History   Socioeconomic History  . Marital status: Divorced    Spouse name: Not on file  . Number of children: Not on file  . Years of education: Not on file  . Highest education level: Not on file  Occupational History  . Not on file  Social Needs  . Financial resource strain: Not on file  . Food insecurity:    Worry: Not on file    Inability: Not on file  . Transportation needs:    Medical: Not on file    Non-medical: Not on file  Tobacco Use  . Smoking status: Current Every Day Smoker    Packs/day: 0.50    Years: 30.00    Pack years: 15.00    Types: Cigarettes  . Smokeless tobacco: Never Used  Substance and Sexual Activity  . Alcohol use: No    Alcohol/week: 0.0 standard drinks  . Drug use: No  . Sexual activity: Yes    Birth control/protection: Post-menopausal  Lifestyle  . Physical activity:    Days per week: Not on file    Minutes per session: Not on file  .  Stress: Not on file  Relationships  . Social connections:    Talks on phone: Not on file    Gets together: Not on file    Attends religious service: Not on file    Active member of club or organization: Not on file    Attends meetings of clubs or organizations: Not on file    Relationship status: Not on file  . Intimate partner violence:    Fear of current or ex partner: Not on file    Emotionally abused: Not on file    Physically abused: Not on file    Forced sexual activity: Not on file  Other Topics Concern  . Not on file  Social History Narrative  . Not on file     FAMILY HISTORY:  Family History  Problem Relation Age of Onset  . Heart attack Mother   . Diabetes Mother   . Throat cancer Father   . Hypertension Father   . Hyperlipidemia Sister   . Diabetes Maternal Grandmother   . Cancer Maternal Grandfather   . Heart attack Paternal Grandfather   . Hyperlipidemia Sister     CURRENT MEDICATIONS:  Outpatient Encounter Medications as of 09/23/2018  Medication Sig  . Artificial Tear Ointment (DRY EYES OP) Apply to eye.  Marland Kitchen aspirin EC 81 MG tablet Take 81 mg by mouth daily.  . cholecalciferol (VITAMIN D) 1000 UNITS tablet Take 2,000 Units by mouth daily.   . hydroxyurea (HYDREA) 500 MG capsule Take 2 capsules (1,000 mg total) by mouth daily. May take with food to minimize GI side effects.  . Multiple Vitamins-Minerals (MULTIVITAMIN WITH MINERALS) tablet Take 1 tablet by mouth daily.  . Omega-3 Fatty Acids (FISH OIL) 1200 MG CAPS Take 1,200 mg by mouth 2 (two) times daily.  Marland Kitchen acetaminophen (TYLENOL) 500 MG tablet Take 1,000 mg by mouth every 6 (six) hours as needed for mild pain or moderate pain.   Marland Kitchen ALPRAZolam (XANAX) 0.25 MG tablet Take 1 tablet (0.25 mg total) by mouth at bedtime as needed for anxiety. (Patient not taking: Reported on 09/23/2018)   No facility-administered encounter medications on file as of 09/23/2018.     ALLERGIES:  No Known Allergies   PHYSICAL EXAM:  ECOG Performance status: 1  Vitals:   09/23/18 1400  BP: 137/80  Pulse: 76  Resp: 16  Temp: 98.2 F (36.8 C)  SpO2: 100%   Filed Weights   09/23/18 1400  Weight: 144 lb 2 oz (65.4 kg)    Physical Exam Constitutional:      Appearance: Normal appearance. She is normal weight.  Cardiovascular:     Rate and Rhythm: Normal rate and regular rhythm.     Heart sounds: Normal heart sounds.  Pulmonary:     Effort: Pulmonary effort is normal.     Breath sounds: Normal breath sounds.  Abdominal:     General: Abdomen is flat.     Palpations: Abdomen is soft.   Musculoskeletal: Normal range of motion.  Skin:    General: Skin is warm and dry.  Neurological:     Mental Status: She is alert and oriented to person, place, and time. Mental status is at baseline.  Psychiatric:        Mood and Affect: Mood normal.        Behavior: Behavior normal.        Thought Content: Thought content normal.        Judgment: Judgment normal.    No palpable splenomegaly or  adenopathy.  LABORATORY DATA:  I have reviewed the labs as listed.  CBC    Component Value Date/Time   WBC 7.7 09/16/2018 1050   RBC 3.19 (L) 09/16/2018 1050   HGB 12.8 09/16/2018 1050   HGB 17.2 (H) 11/30/2017 0841   HCT 39.1 09/16/2018 1050   HCT 51.4 (H) 11/30/2017 0841   PLT 178 09/16/2018 1050   PLT 900 (HH) 11/30/2017 0841   MCV 122.6 (H) 09/16/2018 1050   MCV 95 11/30/2017 0841   MCH 40.1 (H) 09/16/2018 1050   MCHC 32.7 09/16/2018 1050   RDW 12.8 09/16/2018 1050   RDW 15.7 (H) 11/30/2017 0841   LYMPHSABS 2.0 09/16/2018 1050   LYMPHSABS 2.2 11/30/2017 0841   MONOABS 0.5 09/16/2018 1050   EOSABS 0.1 09/16/2018 1050   EOSABS 0.2 11/30/2017 0841   BASOSABS 0.0 09/16/2018 1050   BASOSABS 0.1 11/30/2017 0841   CMP Latest Ref Rng & Units 09/16/2018 07/19/2018 05/17/2018  Glucose 70 - 99 mg/dL 112(H) 89 92  BUN 8 - 23 mg/dL 11 11 8   Creatinine 0.44 - 1.00 mg/dL 0.57 0.62 0.59  Sodium 135 - 145 mmol/L 141 138 139  Potassium 3.5 - 5.1 mmol/L 5.0 4.4 4.0  Chloride 98 - 111 mmol/L 106 106 105  CO2 22 - 32 mmol/L 26 22 24   Calcium 8.9 - 10.3 mg/dL 10.4(H) 9.6 9.4  Total Protein 6.5 - 8.1 g/dL 7.9 8.1 7.9  Total Bilirubin 0.3 - 1.2 mg/dL 0.5 1.0 0.7  Alkaline Phos 38 - 126 U/L 75 78 79  AST 15 - 41 U/L 17 27 19   ALT 0 - 44 U/L 14 15 14        DIAGNOSTIC IMAGING:  I have independently reviewed the scans and discussed with the patient.   I have reviewed Francene Finders, NP's note and agree with the documentation.  I personally performed a face-to-face visit, made revisions and  my assessment and plan is as follows.    ASSESSMENT & PLAN:   Thrombocytosis ([place]) 1.  Jak 2+ myeloproliferative disorder, polycythemia vera: -No prior history of vasomotor symptoms or thrombosis. Given her age more than 54, she was recommended to start on hydroxyurea.  -Bone marrow aspiration and biopsy on 01/11/2018 shows hypercellular (50 to 70% cellularity) marrow with trilineage hematopoiesis.  Reticulin stain shows mild increase in reticulin fibers.  Chromosome analysis shows 83, XX. - Hydroxyurea 500 mg daily started on 12/26/2017, increased to 2 tablets daily on 01/11/2018. - On 07/22/2018, her platelet count dropped to 72.  Hence we cut back dose of Hydrea to 2 tablets on Mondays, Wednesdays and Fridays and 1 tablet all other days. -We have reviewed her blood work today.  Hematocrit is staying below 45.  Platelet count is below 400.  She will continue the same dose of hydroxyurea. -Physical examination today did not reveal any splenomegaly or lymphadenopathy. -I will see her back in 3 months for follow-up with repeat labs.  2.  Sleeplessness: -She is taking Xanax 0.25 mg as needed for sleep which is helping.  3.  Lung nodules: - Patient has a longtime smoking history.  CT of the chest on 12/22/2017 showed many small pulmonary nodules scattered throughout the lungs bilaterally.  -Repeat CT scan of the chest on 03/08/2018 shows stable lung nodules.  -We will do follow-up CT chest without contrast in August 2020.       Orders placed this encounter:  Orders Placed This Encounter  Procedures  . Lactate dehydrogenase  . Magnesium  .  CBC with Differential/Platelet  . Comprehensive metabolic panel  . VITAMIN D 25 Hydroxy (Vit-D Deficiency, Fractures)      Derek Jack, MD [place] [phone]

## 2018-09-23 NOTE — Assessment & Plan Note (Signed)
1.  Jak 2+ myeloproliferative disorder, polycythemia vera: -No prior history of vasomotor symptoms or thrombosis. Given her age more than 50, she was recommended to start on hydroxyurea.  -Bone marrow aspiration and biopsy on 01/11/2018 shows hypercellular (50 to 70% cellularity) marrow with trilineage hematopoiesis.  Reticulin stain shows mild increase in reticulin fibers.  Chromosome analysis shows 26, XX. - Hydroxyurea 500 mg daily started on 12/26/2017, increased to 2 tablets daily on 01/11/2018. - On 07/22/2018, her platelet count dropped to 72.  Hence we cut back dose of Hydrea to 2 tablets on Mondays, Wednesdays and Fridays and 1 tablet all other days. -We have reviewed her blood work today.  Hematocrit is staying below 45.  Platelet count is below 400.  She will continue the same dose of hydroxyurea. -Physical examination today did not reveal any splenomegaly or lymphadenopathy. -I will see her back in 3 months for follow-up with repeat labs.  2.  Sleeplessness: -She is taking Xanax 0.25 mg as needed for sleep which is helping.  3.  Lung nodules: - Patient has a longtime smoking history.  CT of the chest on 12/22/2017 showed many small pulmonary nodules scattered throughout the lungs bilaterally.  -Repeat CT scan of the chest on 03/08/2018 shows stable lung nodules.  -We will do follow-up CT chest without contrast in August 2020.

## 2018-09-23 NOTE — Patient Instructions (Signed)
[facility] at [facility] Discharge Instructions  Follow up in 3 months with labs    Thank you for choosing [facility] at [facility] to provide your oncology and hematology care.  To afford each patient quality time with our provider, please arrive at least 15 minutes before your scheduled appointment time.   If you have a lab appointment with the Cancer Center please come in thru the  Main Entrance and check in at the main information desk  You need to re-schedule your appointment should you arrive 10 or more minutes late.  We strive to give you quality time with our providers, and arriving late affects you and other patients whose appointments are after yours.  Also, if you no show three or more times for appointments you may be dismissed from the clinic at the providers discretion.     Again, thank you for choosing [facility].  Our hope is that these requests will decrease the amount of time that you wait before being seen by our physicians.       _____________________________________________________________  Should you have questions after your visit to [facility], please contact our office at [phone] between the hours of 8:00 a.m. and 4:30 p.m.  Voicemails left after 4:00 p.m. will not be returned until the following business day.  For prescription refill requests, have your pharmacy contact our office and allow 72 hours.    Cancer Center Support Programs:   > Cancer Support Group  2nd Tuesday of the month 1pm-2pm, Journey Room    

## 2018-10-14 ENCOUNTER — Other Ambulatory Visit (HOSPITAL_COMMUNITY): Payer: Self-pay | Admitting: *Deleted

## 2018-10-14 DIAGNOSIS — Z1589 Genetic susceptibility to other disease: Secondary | ICD-10-CM

## 2018-10-14 MED ORDER — HYDROXYUREA 500 MG PO CAPS: 1000.0000 mg | ORAL_CAPSULE | Freq: Every day | ORAL | 3 refills | Status: DC

## 2018-10-21 ENCOUNTER — Other Ambulatory Visit (HOSPITAL_COMMUNITY): Payer: Self-pay | Admitting: *Deleted

## 2018-12-24 ENCOUNTER — Inpatient Hospital Stay (HOSPITAL_COMMUNITY): Payer: BLUE CROSS/BLUE SHIELD | Attending: Hematology

## 2018-12-24 ENCOUNTER — Other Ambulatory Visit: Payer: Self-pay

## 2018-12-24 DIAGNOSIS — Z1589 Genetic susceptibility to other disease: Secondary | ICD-10-CM

## 2018-12-24 DIAGNOSIS — R918 Other nonspecific abnormal finding of lung field: Secondary | ICD-10-CM | POA: Diagnosis not present

## 2018-12-24 DIAGNOSIS — D473 Essential (hemorrhagic) thrombocythemia: Secondary | ICD-10-CM

## 2018-12-24 DIAGNOSIS — D45 Polycythemia vera: Secondary | ICD-10-CM | POA: Insufficient documentation

## 2018-12-24 DIAGNOSIS — D471 Chronic myeloproliferative disease: Secondary | ICD-10-CM

## 2018-12-24 DIAGNOSIS — D75839 Thrombocytosis, unspecified: Secondary | ICD-10-CM

## 2018-12-24 LAB — COMPREHENSIVE METABOLIC PANEL
ALT: 17 U/L (ref 0–44)
AST: 20 U/L (ref 15–41)
Albumin: 4.4 g/dL (ref 3.5–5.0)
Alkaline Phosphatase: 80 U/L (ref 38–126)
Anion gap: 10 (ref 5–15)
BUN: 11 mg/dL (ref 8–23)
CO2: 25 mmol/L (ref 22–32)
Calcium: 9.9 mg/dL (ref 8.9–10.3)
Chloride: 103 mmol/L (ref 98–111)
Creatinine, Ser: 0.64 mg/dL (ref 0.44–1.00)
GFR calc Af Amer: >60 mL/min (ref >60)
GFR calc non Af Amer: >60 mL/min (ref >60)
Glucose, Bld: 93 mg/dL (ref 70–99)
Potassium: 4.4 mmol/L (ref 3.5–5.1)
Sodium: 138 mmol/L (ref 135–145)
Total Bilirubin: 0.7 mg/dL (ref 0.3–1.2)
Total Protein: 7.8 g/dL (ref 6.5–8.1)

## 2018-12-24 LAB — CBC WITH DIFFERENTIAL/PLATELET
Abs Immature Granulocytes: 0.02 10*3/uL (ref 0.00–0.07)
Basophils Absolute: 0 10*3/uL (ref 0.0–0.1)
Basophils Relative: 0 %
Eosinophils Absolute: 0 10*3/uL (ref 0.0–0.5)
Eosinophils Relative: 0 %
HCT: 40.2 % (ref 36.0–46.0)
Hemoglobin: 13.6 g/dL (ref 12.0–15.0)
Immature Granulocytes: 0 %
Lymphocytes Relative: 30 %
Lymphs Abs: 2.3 10*3/uL (ref 0.7–4.0)
MCH: 39.2 pg — ABNORMAL HIGH (ref 26.0–34.0)
MCHC: 33.8 g/dL (ref 30.0–36.0)
MCV: 115.9 fL — ABNORMAL HIGH (ref 80.0–100.0)
Monocytes Absolute: 0.6 10*3/uL (ref 0.1–1.0)
Monocytes Relative: 9 %
Neutro Abs: 4.5 10*3/uL (ref 1.7–7.7)
Neutrophils Relative %: 61 %
Platelets: 218 10*3/uL (ref 150–400)
RBC: 3.47 MIL/uL — ABNORMAL LOW (ref 3.87–5.11)
RDW: 13.4 % (ref 11.5–15.5)
WBC: 7.4 10*3/uL (ref 4.0–10.5)
nRBC: 0 % (ref 0.0–0.2)

## 2018-12-24 LAB — MAGNESIUM: Magnesium: 2.1 mg/dL (ref 1.7–2.4)

## 2018-12-24 LAB — LACTATE DEHYDROGENASE: LDH: 181 U/L (ref 98–192)

## 2018-12-25 LAB — VITAMIN D 25 HYDROXY (VIT D DEFICIENCY, FRACTURES): Vit D, 25-Hydroxy: 49.1 ng/mL (ref 30.0–100.0)

## 2018-12-31 ENCOUNTER — Other Ambulatory Visit: Payer: Self-pay

## 2018-12-31 ENCOUNTER — Inpatient Hospital Stay (HOSPITAL_COMMUNITY): Payer: BLUE CROSS/BLUE SHIELD | Admitting: Nurse Practitioner

## 2018-12-31 ENCOUNTER — Encounter (HOSPITAL_COMMUNITY): Payer: Self-pay | Admitting: Nurse Practitioner

## 2018-12-31 VITALS — BP 135/56 | HR 56 | Temp 98.6°F | Resp 16 | Wt 138.3 lb

## 2018-12-31 DIAGNOSIS — Z122 Encounter for screening for malignant neoplasm of respiratory organs: Secondary | ICD-10-CM

## 2018-12-31 DIAGNOSIS — R918 Other nonspecific abnormal finding of lung field: Secondary | ICD-10-CM

## 2018-12-31 DIAGNOSIS — D45 Polycythemia vera: Secondary | ICD-10-CM | POA: Diagnosis not present

## 2018-12-31 DIAGNOSIS — D471 Chronic myeloproliferative disease: Secondary | ICD-10-CM

## 2018-12-31 NOTE — Assessment & Plan Note (Signed)
1.  [name]: - She had a bone marrow aspiration and biopsy on 01/11/2018 shows hypercellular (50 to 70% cellularity) marrow with trilineage hematopoiesis.  Reticulin stain showed mild increase in reticulin fibers.  Chromosome analysis shows 74, XX. - No prior history of vasomotor symptoms or thrombosis.  Given her age more than 66, she was recommended to start on hydroxyurea. - She takes hydroxyurea 500 mg daily started on 12/26/2017, increased to 2 tablets daily on 02/07/2018. -On 07/22/2018, her platelet count dropped to 72.  Hence we will cut back the dose of Hydrea to 2 tablets on Monday Wednesdays and Fridays and 1 tablet on the other days. - Labs on 12/24/2018 showed her hemoglobin 13.6, hematocrit 40.2, platelets 218. -She will continue the same dose of hydroxyurea.  Our goal is to keep hematocrit below 45 and platelet count below 400. -Physical examination today did not reveal any splenomegaly or lymphadenopathy. -She denies any vasomotor symptoms or aquagenic pruritus.  She denies any leg ulcers or nausea. -We will see her back in 3 months for follow-up and repeat labs.  2.  Sleeplessness: - She is taking Xanax 0.25 mg as needed for sleep which is helping.  3.  Lung nodules: -Patient has a longtime smoking history. -CT of the chest on 12/22/2017 showed many small pulmonary nodules scattered throughout the lungs bilaterally. -We will repeat a CT of the chest in August 2020 with her next visit.

## 2018-12-31 NOTE — Patient Instructions (Signed)
[facility] at [facility] Discharge Instructions  Follow up in 3 months with labs    Thank you for choosing [facility] at [facility] to provide your oncology and hematology care.  To afford each patient quality time with our provider, please arrive at least 15 minutes before your scheduled appointment time.   If you have a lab appointment with the Cancer Center please come in thru the  Main Entrance and check in at the main information desk  You need to re-schedule your appointment should you arrive 10 or more minutes late.  We strive to give you quality time with our providers, and arriving late affects you and other patients whose appointments are after yours.  Also, if you no show three or more times for appointments you may be dismissed from the clinic at the providers discretion.     Again, thank you for choosing [facility].  Our hope is that these requests will decrease the amount of time that you wait before being seen by our physicians.       _____________________________________________________________  Should you have questions after your visit to [facility], please contact our office at [phone] between the hours of 8:00 a.m. and 4:30 p.m.  Voicemails left after 4:00 p.m. will not be returned until the following business day.  For prescription refill requests, have your pharmacy contact our office and allow 72 hours.    Cancer Center Support Programs:   > Cancer Support Group  2nd Tuesday of the month 1pm-2pm, Journey Room    

## 2018-12-31 NOTE — Progress Notes (Signed)
[place], [place] [address]   CLINIC:  Medical Oncology/Hematology  PCP:  Sharilyn Sites, MD [address] [place] [address] [phone]   REASON FOR VISIT: Follow-up for [name]  CURRENT THERAPY: Hydroxyurea 2 tabs Monday Wednesday Friday, 1 tab other days.   INTERVAL HISTORY:  Denise Maynard 63 y.o. female returns for routine follow-up for polycythemia Jak 2+.  She has been doing well since her last visit.  She denies any leg ulcers or nausea.  She has been taking her medication as prescribed.  She does not need any refills at this time. Denies any nausea, vomiting, or diarrhea. Denies any new pains. Had not noticed any recent bleeding such as epistaxis, hematuria or hematochezia. Denies recent chest pain on exertion, shortness of breath on minimal exertion, pre-syncopal episodes, or palpitations. Denies any numbness or tingling in hands or feet. Denies any recent fevers, infections, or recent hospitalizations. Patient reports appetite at 100% and energy level at 100%.  She is eating well and maintaining her weight at this time.    REVIEW OF SYSTEMS:  Review of Systems  All other systems reviewed and are negative.    PAST MEDICAL/SURGICAL HISTORY:  Past Medical History:  Diagnosis Date  . Carotid artery aneurysm (HCC)    Coil embolization of a supraclinoid right internal carotid artery - Dr. Kathyrn Sheriff  . Hyperlipidemia   . Nicotine addiction 05/15/2015   Will try patch  . Serum calcium elevated 11/19/2017   recheck  . Serum potassium elevated 11/19/2017  . Stroke [facility])    Past Surgical History:  Procedure Laterality Date  . ABDOMINAL AORTAGRAM N/A 11/24/2014   Procedure: ABDOMINAL Maxcine Ham;  Surgeon: Elam Dutch, MD;  Location: [facility] CATH LAB;  Service: Cardiovascular;  Laterality: N/A;  . Aneursym repair  10/2014  . COLONOSCOPY N/A 07/30/2015   Procedure: COLONOSCOPY;  Surgeon: Danie Binder, MD;   Location: [facility] ENDO SUITE;  Service: Endoscopy;  Laterality: N/A;  1:00 Pm - moved to 1:30 - office to notify  . FRACTURE SURGERY     Jaw  . LOWER EXTREMITY ANGIOGRAM  11/24/2014   Procedure: LOWER EXTREMITY ANGIOGRAM;  Surgeon: Elam Dutch, MD;  Location: [facility] CATH LAB;  Service: Cardiovascular;;  . MOUTH SURGERY    . RADIOLOGY WITH ANESTHESIA N/A 10/12/2014   Procedure: Embolization/arteriogram;  Surgeon: Consuella Lose, MD;  Location: [place];  Service: Radiology;  Laterality: N/A;     SOCIAL HISTORY:  Social History   Socioeconomic History  . Marital status: Divorced    Spouse name: Not on file  . Number of children: Not on file  . Years of education: Not on file  . Highest education level: Not on file  Occupational History  . Not on file  Social Needs  . Financial resource strain: Not on file  . Food insecurity:    Worry: Not on file    Inability: Not on file  . Transportation needs:    Medical: Not on file    Non-medical: Not on file  Tobacco Use  . Smoking status: Current Every Day Smoker    Packs/day: 0.50    Years: 30.00    Pack years: 15.00    Types: Cigarettes  . Smokeless tobacco: Never Used  Substance and Sexual Activity  . Alcohol use: No    Alcohol/week: 0.0 standard drinks  . Drug use: No  . Sexual activity: Yes    Birth control/protection: Post-menopausal  Lifestyle  .  Physical activity:    Days per week: Not on file    Minutes per session: Not on file  . Stress: Not on file  Relationships  . Social connections:    Talks on phone: Not on file    Gets together: Not on file    Attends religious service: Not on file    Active member of club or organization: Not on file    Attends meetings of clubs or organizations: Not on file    Relationship status: Not on file  . Intimate partner violence:    Fear of current or ex partner: Not on file    Emotionally abused: Not on file    Physically abused: Not on file    Forced sexual activity: Not on file   Other Topics Concern  . Not on file  Social History Narrative  . Not on file    FAMILY HISTORY:  Family History  Problem Relation Age of Onset  . Heart attack Mother   . Diabetes Mother   . Throat cancer Father   . Hypertension Father   . Hyperlipidemia Sister   . Diabetes Maternal Grandmother   . Cancer Maternal Grandfather   . Heart attack Paternal Grandfather   . Hyperlipidemia Sister     CURRENT MEDICATIONS:  Outpatient Encounter Medications as of 12/31/2018  Medication Sig  . acetaminophen (TYLENOL) 500 MG tablet Take 1,000 mg by mouth every 6 (six) hours as needed for mild pain or moderate pain.   Marland Kitchen ALPRAZolam (XANAX) 0.25 MG tablet Take 1 tablet (0.25 mg total) by mouth at bedtime as needed for anxiety.  . Artificial Tear Ointment (DRY EYES OP) Apply to eye.  Marland Kitchen aspirin EC 81 MG tablet Take 81 mg by mouth daily.  . cholecalciferol (VITAMIN D) 1000 UNITS tablet Take 2,000 Units by mouth daily.   . hydroxyurea (HYDREA) 500 MG capsule Take 2 capsules (1,000 mg total) by mouth daily. May take with food to minimize GI side effects. (Patient taking differently: Take 1,000 mg by mouth daily. May take with food to minimize GI side effects. Takes 2 on MWF and 1 on other days)  . Multiple Vitamins-Minerals (MULTIVITAMIN WITH MINERALS) tablet Take 1 tablet by mouth daily.  . Omega-3 Fatty Acids (FISH OIL) 1200 MG CAPS Take 1,200 mg by mouth 2 (two) times daily.   No facility-administered encounter medications on file as of 12/31/2018.     ALLERGIES:  No Known Allergies   PHYSICAL EXAM:  ECOG Performance status: 1  Vitals:   12/31/18 1000  BP: (!) 135/56  Pulse: (!) 56  Resp: 16  Temp: 98.6 F (37 C)  SpO2: 100%   Filed Weights   12/31/18 1000  Weight: 138 lb 5 oz (62.7 kg)    Physical Exam Constitutional:      Appearance: Normal appearance. She is normal weight.  Cardiovascular:     Rate and Rhythm: Normal rate and regular rhythm.     Heart sounds: Normal heart  sounds.  Pulmonary:     Effort: Pulmonary effort is normal.     Breath sounds: Normal breath sounds.  Abdominal:     General: Bowel sounds are normal.     Palpations: Abdomen is soft.  Musculoskeletal: Normal range of motion.  Skin:    General: Skin is warm and dry.  Neurological:     Mental Status: She is alert and oriented to person, place, and time. Mental status is at baseline.  Psychiatric:  Mood and Affect: Mood normal.        Behavior: Behavior normal.        Thought Content: Thought content normal.        Judgment: Judgment normal.      LABORATORY DATA:  I have reviewed the labs as listed.  CBC    Component Value Date/Time   WBC 7.4 12/24/2018 1206   RBC 3.47 (L) 12/24/2018 1206   HGB 13.6 12/24/2018 1206   HGB 17.2 (H) 11/30/2017 0841   HCT 40.2 12/24/2018 1206   HCT 51.4 (H) 11/30/2017 0841   PLT 218 12/24/2018 1206   PLT 900 (HH) 11/30/2017 0841   MCV 115.9 (H) 12/24/2018 1206   MCV 95 11/30/2017 0841   MCH 39.2 (H) 12/24/2018 1206   MCHC 33.8 12/24/2018 1206   RDW 13.4 12/24/2018 1206   RDW 15.7 (H) 11/30/2017 0841   LYMPHSABS 2.3 12/24/2018 1206   LYMPHSABS 2.2 11/30/2017 0841   MONOABS 0.6 12/24/2018 1206   EOSABS 0.0 12/24/2018 1206   EOSABS 0.2 11/30/2017 0841   BASOSABS 0.0 12/24/2018 1206   BASOSABS 0.1 11/30/2017 0841   CMP Latest Ref Rng & Units 12/24/2018 09/16/2018 07/19/2018  Glucose 70 - 99 mg/dL 93 112(H) 89  BUN 8 - 23 mg/dL 11 11 11   Creatinine 0.44 - 1.00 mg/dL 0.64 0.57 0.62  Sodium 135 - 145 mmol/L 138 141 138  Potassium 3.5 - 5.1 mmol/L 4.4 5.0 4.4  Chloride 98 - 111 mmol/L 103 106 106  CO2 22 - 32 mmol/L 25 26 22   Calcium 8.9 - 10.3 mg/dL 9.9 10.4(H) 9.6  Total Protein 6.5 - 8.1 g/dL 7.8 7.9 8.1  Total Bilirubin 0.3 - 1.2 mg/dL 0.7 0.5 1.0  Alkaline Phos 38 - 126 U/L 80 75 78  AST 15 - 41 U/L 20 17 27   ALT 0 - 44 U/L 17 14 15     I personally performed a face-to-face visit.  All questions were answered to patient's  stated satisfaction. Encouraged patient to call with any new concerns or questions before his next visit to the cancer center and we can certain see him sooner, if needed.     ASSESSMENT & PLAN:   Myeloproliferative disorder ([place]) 1.  [name]: - She had a bone marrow aspiration and biopsy on 01/11/2018 shows hypercellular (50 to 70% cellularity) marrow with trilineage hematopoiesis.  Reticulin stain showed mild increase in reticulin fibers.  Chromosome analysis shows 79, XX. - No prior history of vasomotor symptoms or thrombosis.  Given her age more than 54, she was recommended to start on hydroxyurea. - She takes hydroxyurea 500 mg daily started on 12/26/2017, increased to 2 tablets daily on 02/07/2018. -On 07/22/2018, her platelet count dropped to 72.  Hence we will cut back the dose of Hydrea to 2 tablets on Monday Wednesdays and Fridays and 1 tablet on the other days. - Labs on 12/24/2018 showed her hemoglobin 13.6, hematocrit 40.2, platelets 218. -She will continue the same dose of hydroxyurea.  Our goal is to keep hematocrit below 45 and platelet count below 400. -Physical examination today did not reveal any splenomegaly or lymphadenopathy. -She denies any vasomotor symptoms or aquagenic pruritus.  She denies any leg ulcers or nausea. -We will see her back in 3 months for follow-up and repeat labs.  2.  Sleeplessness: - She is taking Xanax 0.25 mg as needed for sleep which is helping.  3.  Lung nodules: -Patient has a longtime smoking history. -CT  of the chest on 12/22/2017 showed many small pulmonary nodules scattered throughout the lungs bilaterally. -We will repeat a CT of the chest in August 2020 with her next visit.      Orders placed this encounter:  Orders Placed This Encounter  Procedures  . CT CHEST LUNG CA SCREEN LOW DOSE W/O CM  . Lupus anticoagulant panel  . CBC with Differential/Platelet  . Comprehensive metabolic panel      Francene Finders, FNP-C  [place] [phone]

## 2018-12-31 NOTE — Addendum Note (Signed)
Addended by: Glennie Isle on: 12/31/2018 02:00 PM   Modules accepted: Orders

## 2019-01-05 IMAGING — CT CT CHEST LUNG CANCER SCREENING LOW DOSE W/O CM
1 of 4 series · 10 of 40 positions shown, 13 images · non-contrast
Comparison: No priors.

CLINICAL DATA: 61-year-old female current smoker with 34 pack-year
history of smoking. Lung cancer screening examination.

EXAM:
CT CHEST WITHOUT CONTRAST LOW-DOSE FOR LUNG CANCER SCREENING
TECHNIQUE: Multidetector CT imaging of the chest was performed following the
standard protocol without IV contrast.

[ct lung segmentation data · axial · 0.57mm/px · z∈[+1094,+1094]mm · 10 of 286 frames shown]
[frame 1/286  mediastinal]
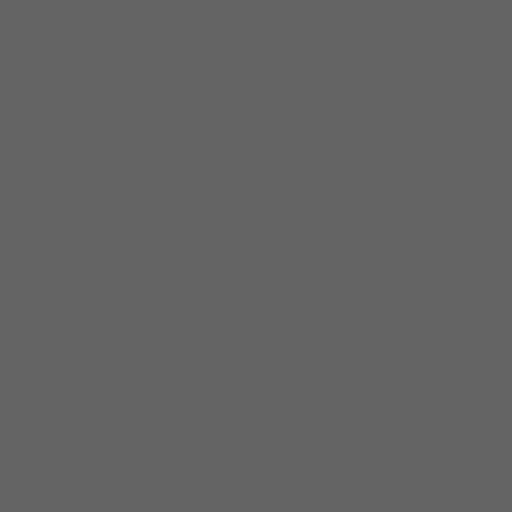
[frame 1/286  lung]
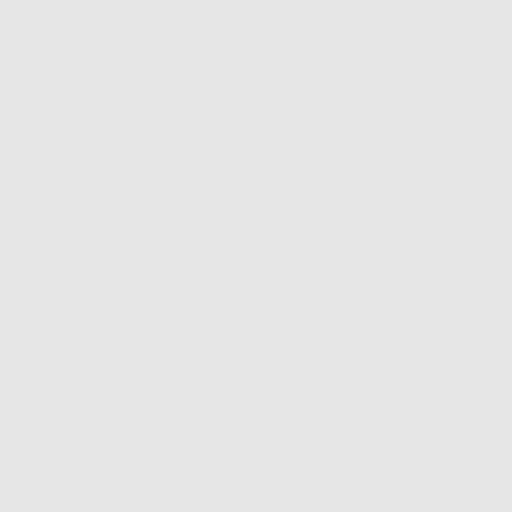
[frame 32/286  lung]
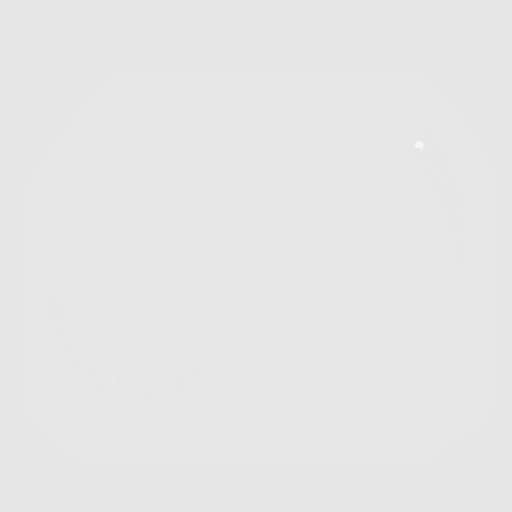
[frame 64/286  lung]
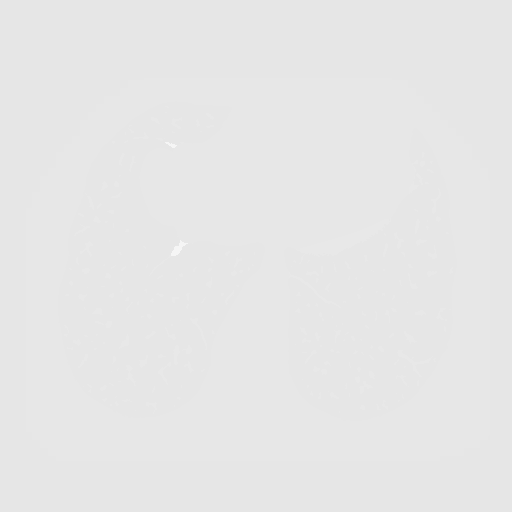
[frame 96/286  lung]
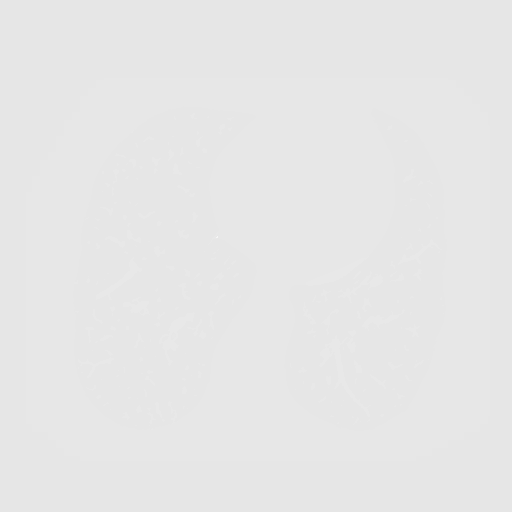
[frame 127/286  mediastinal]
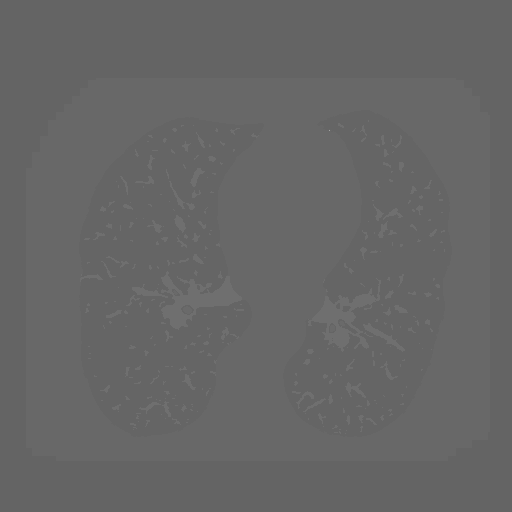
[frame 127/286  lung]
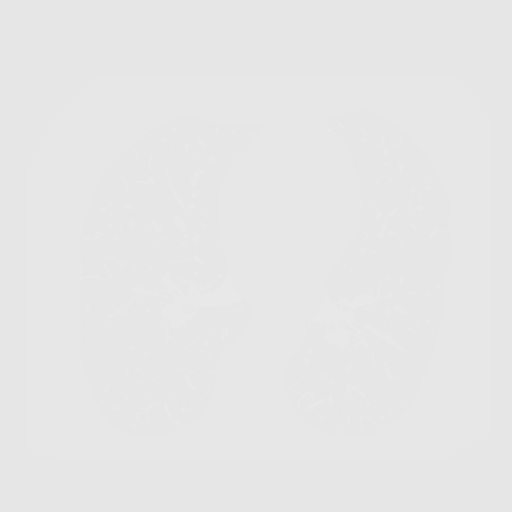
[frame 159/286  lung]
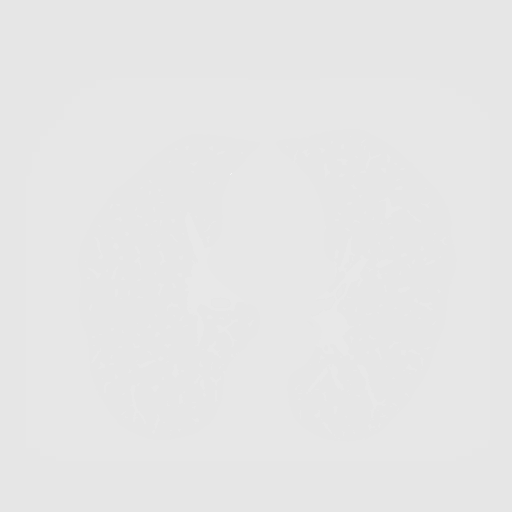
[frame 191/286  lung]
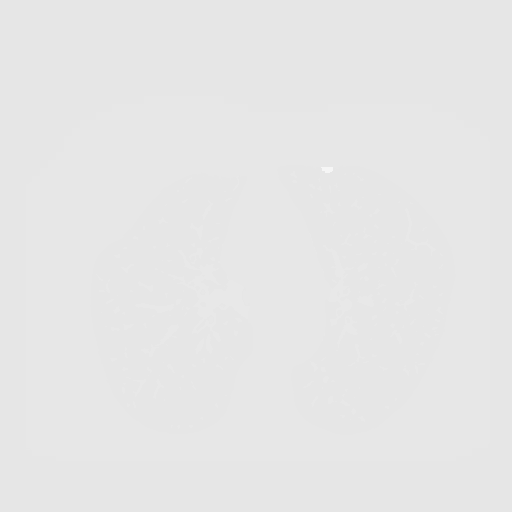
[frame 222/286  lung]
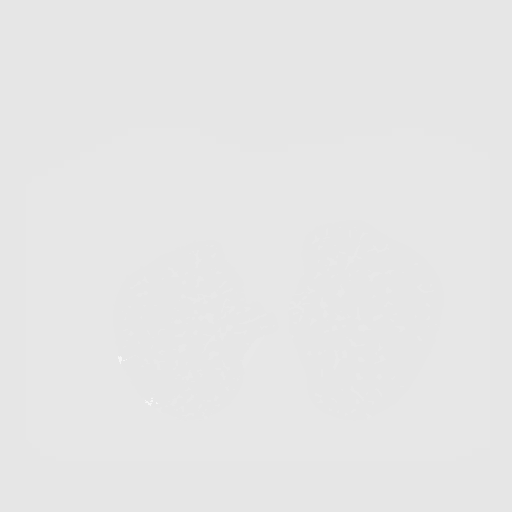
[frame 254/286  mediastinal]
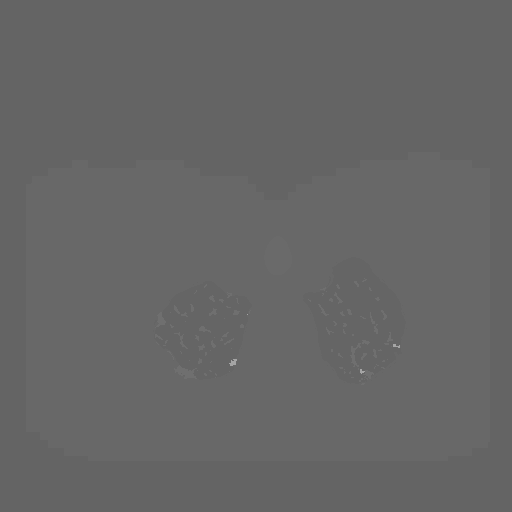
[frame 254/286  lung]
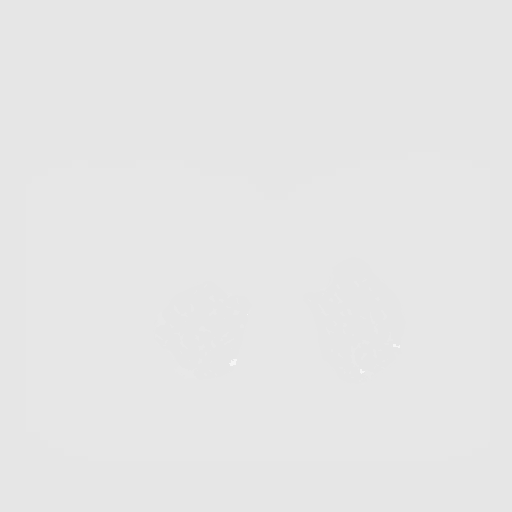
[frame 286/286  lung]
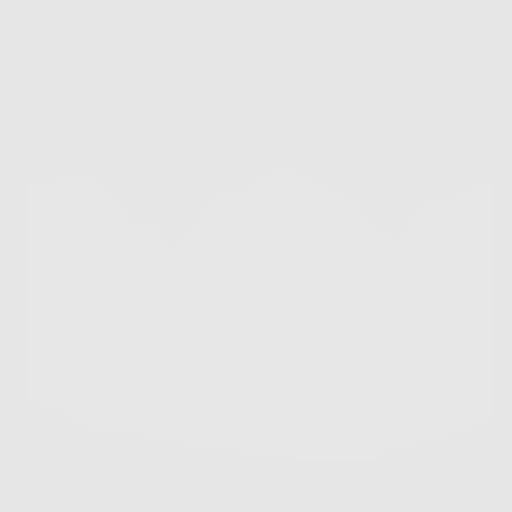

[10 of 40 positions shown; findings below may reference images not displayed]

FINDINGS: Cardiovascular: Heart size is normal. There is no significant
pericardial fluid, thickening or pericardial calcification. There is
aortic atherosclerosis, as well as atherosclerosis of the great
vessels of the mediastinum and the coronary arteries, including
calcified atherosclerotic plaque in the left anterior descending and
right coronary arteries.

Mediastinum/Nodes: No pathologically enlarged mediastinal or hilar
lymph nodes. Please note that accurate exclusion of hilar adenopathy
is limited on noncontrast CT scans. Esophagus is unremarkable in
appearance. No axillary lymphadenopathy.

Lungs/Pleura: Multiple pulmonary nodules are noted throughout the
lungs bilaterally, the largest of which is a pleural-based nodule in
the posterior aspect of the right upper lobe (axial image 58 of
series 3), with a volume derived mean diameter of 8.9 mm. No other
larger more suspicious appearing pulmonary nodules or masses are
noted. There are extensive areas of pleuroparenchymal thickening and
nodular architectural distortion in the apices of the lungs
bilaterally, most compatible with chronic post infectious or
inflammatory scarring. No acute consolidative airspace disease. No
pleural effusions. Mild diffuse bronchial wall thickening with mild
centrilobular and paraseptal emphysema.

Upper Abdomen: Aortic atherosclerosis.

Musculoskeletal: There are no aggressive appearing lytic or blastic
lesions noted in the visualized portions of the skeleton.
IMPRESSION: 1. Amongst the many small pulmonary nodules scattered throughout the
lungs bilaterally, there are several other larger nodular areas of
architectural distortion, most of which are in the apices of the
lungs bilaterally, including the largest nodule mentioned above.
These are all favored to be part of underlying chronic post
infectious or inflammatory changes, but from a strict size criteria,
the 8.9 mm nodule is considered a Lung-RADS 4AS, suspicious. Follow
up low-dose chest CT without contrast in 3 months (please use the
following order, "CT CHEST LCS NODULE FOLLOW-UP W/O CM") is
recommended.
2. The "S" modifier above refers to potentially clinically
significant non lung cancer related findings. Specifically, there is
aortic atherosclerosis, in addition to 2 vessel coronary artery
disease. Please note that although the presence of coronary artery
calcium documents the presence of coronary artery disease, the
severity of this disease and any potential stenosis cannot be
assessed on this non-gated CT examination. Assessment for potential
risk factor modification, dietary therapy or pharmacologic therapy
may be warranted, if clinically indicated.
3. Mild diffuse bronchial wall thickening with mild centrilobular
and paraseptal emphysema; imaging findings suggestive of underlying
COPD.

Aortic Atherosclerosis (UZSWM-NO9.9) and Emphysema (UZSWM-SM1.2).

## 2019-01-17 ENCOUNTER — Other Ambulatory Visit (HOSPITAL_COMMUNITY): Payer: Self-pay | Admitting: Adult Health

## 2019-01-17 DIAGNOSIS — Z1231 Encounter for screening mammogram for malignant neoplasm of breast: Secondary | ICD-10-CM

## 2019-01-24 ENCOUNTER — Ambulatory Visit (HOSPITAL_COMMUNITY): Payer: BLUE CROSS/BLUE SHIELD

## 2019-01-31 ENCOUNTER — Other Ambulatory Visit (HOSPITAL_COMMUNITY): Payer: Self-pay | Admitting: Surgery

## 2019-01-31 DIAGNOSIS — Z1589 Genetic susceptibility to other disease: Secondary | ICD-10-CM

## 2019-01-31 MED ORDER — HYDROXYUREA 500 MG PO CAPS: 1000.0000 mg | ORAL_CAPSULE | Freq: Every day | ORAL | 3 refills | Status: DC

## 2019-03-25 ENCOUNTER — Other Ambulatory Visit (HOSPITAL_COMMUNITY): Payer: BLUE CROSS/BLUE SHIELD

## 2019-03-25 ENCOUNTER — Other Ambulatory Visit: Payer: Self-pay

## 2019-03-25 ENCOUNTER — Ambulatory Visit (HOSPITAL_COMMUNITY)
Admission: RE | Admit: 2019-03-25 | Discharge: 2019-03-25 | Disposition: A | Discharge status: Home / Self Care | Payer: Medicare Other | Source: Ambulatory Visit | Attending: Nurse Practitioner | Admitting: Nurse Practitioner

## 2019-03-25 ENCOUNTER — Inpatient Hospital Stay (HOSPITAL_COMMUNITY): Payer: Medicare Other | Attending: Hematology

## 2019-03-25 DIAGNOSIS — D45 Polycythemia vera: Secondary | ICD-10-CM | POA: Insufficient documentation

## 2019-03-25 DIAGNOSIS — I700 Atherosclerosis of aorta: Secondary | ICD-10-CM | POA: Diagnosis not present

## 2019-03-25 DIAGNOSIS — I2510 Atherosclerotic heart disease of native coronary artery without angina pectoris: Secondary | ICD-10-CM | POA: Diagnosis not present

## 2019-03-25 DIAGNOSIS — I358 Other nonrheumatic aortic valve disorders: Secondary | ICD-10-CM | POA: Diagnosis not present

## 2019-03-25 DIAGNOSIS — Z122 Encounter for screening for malignant neoplasm of respiratory organs: Secondary | ICD-10-CM

## 2019-03-25 DIAGNOSIS — R918 Other nonspecific abnormal finding of lung field: Secondary | ICD-10-CM | POA: Insufficient documentation

## 2019-03-25 DIAGNOSIS — J432 Centrilobular emphysema: Secondary | ICD-10-CM | POA: Diagnosis not present

## 2019-03-25 DIAGNOSIS — F17210 Nicotine dependence, cigarettes, uncomplicated: Secondary | ICD-10-CM | POA: Diagnosis not present

## 2019-03-25 DIAGNOSIS — D471 Chronic myeloproliferative disease: Secondary | ICD-10-CM

## 2019-03-25 LAB — CBC WITH DIFFERENTIAL/PLATELET
Abs Immature Granulocytes: 0.03 10*3/uL (ref 0.00–0.07)
Basophils Absolute: 0 10*3/uL (ref 0.0–0.1)
Basophils Relative: 0 %
Eosinophils Absolute: 0 10*3/uL (ref 0.0–0.5)
Eosinophils Relative: 0 %
HCT: 38.7 % (ref 36.0–46.0)
Hemoglobin: 12.7 g/dL (ref 12.0–15.0)
Immature Granulocytes: 1 %
Lymphocytes Relative: 27 %
Lymphs Abs: 1.4 10*3/uL (ref 0.7–4.0)
MCH: 38.8 pg — ABNORMAL HIGH (ref 26.0–34.0)
MCHC: 32.8 g/dL (ref 30.0–36.0)
MCV: 118.3 fL — ABNORMAL HIGH (ref 80.0–100.0)
Monocytes Absolute: 0.4 10*3/uL (ref 0.1–1.0)
Monocytes Relative: 8 %
Neutro Abs: 3.2 10*3/uL (ref 1.7–7.7)
Neutrophils Relative %: 64 %
Platelets: 231 10*3/uL (ref 150–400)
RBC: 3.27 MIL/uL — ABNORMAL LOW (ref 3.87–5.11)
RDW: 14 % (ref 11.5–15.5)
WBC: 5.1 10*3/uL (ref 4.0–10.5)
nRBC: 0 % (ref 0.0–0.2)

## 2019-03-25 LAB — COMPREHENSIVE METABOLIC PANEL
ALT: 17 U/L (ref 0–44)
AST: 21 U/L (ref 15–41)
Albumin: 4.2 g/dL (ref 3.5–5.0)
Alkaline Phosphatase: 71 U/L (ref 38–126)
Anion gap: 8 (ref 5–15)
BUN: 9 mg/dL (ref 8–23)
CO2: 25 mmol/L (ref 22–32)
Calcium: 9.5 mg/dL (ref 8.9–10.3)
Chloride: 107 mmol/L (ref 98–111)
Creatinine, Ser: 0.58 mg/dL (ref 0.44–1.00)
GFR calc Af Amer: >60 mL/min (ref >60)
GFR calc non Af Amer: >60 mL/min (ref >60)
Glucose, Bld: 107 mg/dL — ABNORMAL HIGH (ref 70–99)
Potassium: 4.3 mmol/L (ref 3.5–5.1)
Sodium: 140 mmol/L (ref 135–145)
Total Bilirubin: 0.4 mg/dL (ref 0.3–1.2)
Total Protein: 7.4 g/dL (ref 6.5–8.1)

## 2019-03-25 LAB — LACTATE DEHYDROGENASE: LDH: 174 U/L (ref 98–192)

## 2019-04-01 ENCOUNTER — Other Ambulatory Visit: Payer: Self-pay

## 2019-04-01 ENCOUNTER — Inpatient Hospital Stay (HOSPITAL_BASED_OUTPATIENT_CLINIC_OR_DEPARTMENT_OTHER): Payer: Medicare Other | Admitting: Nurse Practitioner

## 2019-04-01 DIAGNOSIS — D471 Chronic myeloproliferative disease: Secondary | ICD-10-CM | POA: Diagnosis not present

## 2019-04-01 DIAGNOSIS — R918 Other nonspecific abnormal finding of lung field: Secondary | ICD-10-CM | POA: Diagnosis not present

## 2019-04-01 DIAGNOSIS — D45 Polycythemia vera: Secondary | ICD-10-CM | POA: Diagnosis not present

## 2019-04-01 NOTE — Progress Notes (Signed)
Denise Maynard, [place] [address]   CLINIC:  Medical Oncology/Hematology  PCP:  Sharilyn Sites, MD [address] [place] [id number] [phone]   REASON FOR VISIT: Follow-up for [name]  CURRENT THERAPY: Hydrea 500 mg 2 tabs Monday Wednesday Friday, and 1 tablet other days.   INTERVAL HISTORY:  Ms. Bellow 63 y.o. female returns for routine follow-up for [name].  Patient reports she has been doing well since her last visit.  She denies any aquagenic pruritus.  She denies any nonhealing sores. Denies any nausea, vomiting, or diarrhea. Denies any new pains. Had not noticed any recent bleeding such as epistaxis, hematuria or hematochezia. Denies recent chest pain on exertion, shortness of breath on minimal exertion, pre-syncopal episodes, or palpitations. Denies any numbness or tingling in hands or feet. Denies any recent fevers, infections, or recent hospitalizations. Patient reports appetite at 75% and energy level at 75%.  She is eating well maintaining her weight at this time.    REVIEW OF SYSTEMS:  Review of Systems  Psychiatric/Behavioral: Positive for sleep disturbance.  All other systems reviewed and are negative.    PAST MEDICAL/SURGICAL HISTORY:  Past Medical History:  Diagnosis Date  . Carotid artery aneurysm (HCC)    Coil embolization of a supraclinoid right internal carotid artery - Dr. Kathyrn Sheriff  . Hyperlipidemia   . Nicotine addiction 05/15/2015   Will try patch  . Serum calcium elevated 11/19/2017   recheck  . Serum potassium elevated 11/19/2017  . Stroke [facility])    Past Surgical History:  Procedure Laterality Date  . ABDOMINAL AORTAGRAM N/A 11/24/2014   Procedure: ABDOMINAL Maxcine Ham;  Surgeon: Elam Dutch, MD;  Location: [facility] CATH LAB;  Service: Cardiovascular;  Laterality: N/A;  . Aneursym repair  10/2014  . COLONOSCOPY N/A 07/30/2015   Procedure: COLONOSCOPY;  Surgeon: Danie Binder, MD;  [facility];  Service: Endoscopy;  Laterality: N/A;  1:00 Pm - moved to 1:30 - office to notify  . FRACTURE SURGERY     Jaw  . LOWER EXTREMITY ANGIOGRAM  11/24/2014   Procedure: LOWER EXTREMITY ANGIOGRAM;  Surgeon: Elam Dutch, MD;  Location: [facility] CATH LAB;  Service: Cardiovascular;;  . MOUTH SURGERY    . RADIOLOGY WITH ANESTHESIA N/A 10/12/2014   Procedure: Embolization/arteriogram;  Surgeon: Consuella Lose, MD;  Location: [place];  Service: Radiology;  Laterality: N/A;     SOCIAL HISTORY:  Social History   Socioeconomic History  . Marital status: Divorced    Spouse name: Not on file  . Number of children: Not on file  . Years of education: Not on file  . Highest education level: Not on file  Occupational History  . Not on file  Social Needs  . Financial resource strain: Not on file  . Food insecurity    Worry: Not on file    Inability: Not on file  . Transportation needs    Medical: Not on file    Non-medical: Not on file  Tobacco Use  . Smoking status: Current Every Day Smoker    Packs/day: 0.50    Years: 30.00    Pack years: 15.00    Types: Cigarettes  . Smokeless tobacco: Never Used  Substance and Sexual Activity  . Alcohol use: No    Alcohol/week: 0.0 standard drinks  . Drug use: No  . Sexual activity: Yes    Birth control/protection: Post-menopausal  Lifestyle  . Physical activity  Days per week: Not on file    Minutes per session: Not on file  . Stress: Not on file  Relationships  . Social Herbalist on phone: Not on file    Gets together: Not on file    Attends religious service: Not on file    Active member of club or organization: Not on file    Attends meetings of clubs or organizations: Not on file    Relationship status: Not on file  . Intimate partner violence    Fear of current or ex partner: Not on file    Emotionally abused: Not on file    Physically abused: Not on file    Forced sexual activity: Not  on file  Other Topics Concern  . Not on file  Social History Narrative  . Not on file    FAMILY HISTORY:  Family History  Problem Relation Age of Onset  . Heart attack Mother   . Diabetes Mother   . Throat cancer Father   . Hypertension Father   . Hyperlipidemia Sister   . Diabetes Maternal Grandmother   . Cancer Maternal Grandfather   . Heart attack Paternal Grandfather   . Hyperlipidemia Sister     CURRENT MEDICATIONS:  Outpatient Encounter Medications as of 04/01/2019  Medication Sig  . acetaminophen (TYLENOL) 500 MG tablet Take 1,000 mg by mouth every 6 (six) hours as needed for mild pain or moderate pain.   Marland Kitchen ALPRAZolam (XANAX) 0.25 MG tablet Take 1 tablet (0.25 mg total) by mouth at bedtime as needed for anxiety.  . Artificial Tear Ointment (DRY EYES OP) Apply to eye.  Marland Kitchen aspirin EC 81 MG tablet Take 81 mg by mouth daily.  . cholecalciferol (VITAMIN D) 1000 UNITS tablet Take 2,000 Units by mouth daily.   . hydroxyurea (HYDREA) 500 MG capsule Take 2 capsules (1,000 mg total) by mouth daily. May take with food to minimize GI side effects. (Patient taking differently: Take 1,000 mg by mouth daily. May take with food to minimize GI side effects. Take 2 tablets Monday, Wednesday and Friday and take 1 tablet Tuesday,Thursday,Saturday, and Sunday)  . Multiple Vitamins-Minerals (MULTIVITAMIN WITH MINERALS) tablet Take 1 tablet by mouth daily.  . Omega-3 Fatty Acids (FISH OIL) 1200 MG CAPS Take 1,200 mg by mouth 2 (two) times daily.   No facility-administered encounter medications on file as of 04/01/2019.     ALLERGIES:  No Known Allergies   PHYSICAL EXAM:  ECOG Performance status: 1  Vitals:   04/01/19 1120  BP: 138/71  Pulse: (!) 56  Resp: 18  Temp: 97.7 F (36.5 C)  SpO2: 94%   Filed Weights   04/01/19 1120  Weight: 136 lb 11.2 oz (62 kg)    Physical Exam Constitutional:      Appearance: Normal appearance. She is normal weight.  Cardiovascular:     Rate and  Rhythm: Normal rate and regular rhythm.     Heart sounds: Normal heart sounds.  Pulmonary:     Effort: Pulmonary effort is normal.     Breath sounds: Normal breath sounds.  Abdominal:     General: Bowel sounds are normal.     Palpations: Abdomen is soft.  Musculoskeletal: Normal range of motion.  Skin:    General: Skin is warm and dry.  Neurological:     Mental Status: She is alert and oriented to person, place, and time. Mental status is at baseline.  Psychiatric:  Mood and Affect: Mood normal.        Behavior: Behavior normal.        Thought Content: Thought content normal.        Judgment: Judgment normal.      LABORATORY DATA:  I have reviewed the labs as listed.  CBC    Component Value Date/Time   WBC 5.1 03/25/2019 0858   RBC 3.27 (L) 03/25/2019 0858   HGB 12.7 03/25/2019 0858   HGB 17.2 (H) 11/30/2017 0841   HCT 38.7 03/25/2019 0858   HCT 51.4 (H) 11/30/2017 0841   PLT 231 03/25/2019 0858   PLT 900 (HH) 11/30/2017 0841   MCV 118.3 (H) 03/25/2019 0858   MCV 95 11/30/2017 0841   MCH 38.8 (H) 03/25/2019 0858   MCHC 32.8 03/25/2019 0858   RDW 14.0 03/25/2019 0858   RDW 15.7 (H) 11/30/2017 0841   LYMPHSABS 1.4 03/25/2019 0858   LYMPHSABS 2.2 11/30/2017 0841   MONOABS 0.4 03/25/2019 0858   EOSABS 0.0 03/25/2019 0858   EOSABS 0.2 11/30/2017 0841   BASOSABS 0.0 03/25/2019 0858   BASOSABS 0.1 11/30/2017 0841   CMP Latest Ref Rng & Units 03/25/2019 12/24/2018 09/16/2018  Glucose 70 - 99 mg/dL 107(H) 93 112(H)  BUN 8 - 23 mg/dL 9 11 11   Creatinine 0.44 - 1.00 mg/dL 0.58 0.64 0.57  Sodium 135 - 145 mmol/L 140 138 141  Potassium 3.5 - 5.1 mmol/L 4.3 4.4 5.0  Chloride 98 - 111 mmol/L 107 103 106  CO2 22 - 32 mmol/L 25 25 26   Calcium 8.9 - 10.3 mg/dL 9.5 9.9 10.4(H)  Total Protein 6.5 - 8.1 g/dL 7.4 7.8 7.9  Total Bilirubin 0.3 - 1.2 mg/dL 0.4 0.7 0.5  Alkaline Phos 38 - 126 U/L 71 80 75  AST 15 - 41 U/L 21 20 17   ALT 0 - 44 U/L 17 17 14      I personally  performed a face-to-face visit.  All questions were answered to patient's stated satisfaction. Encouraged patient to call with any new concerns or questions before his next visit to the cancer center and we can certain see him sooner, if needed.     ASSESSMENT & PLAN:   Myeloproliferative disorder ([place]) 1.  [name]: - She had a bone marrow aspiration and biopsy on 01/11/2018 shows hypercellular (50 to 70% cellularity) marrow with trilineage hematopoiesis.  Reticulin stain showed mild increase in reticulin fibers.  Chromosome analysis shows 85, XX. - No prior history of vasomotor symptoms or thrombosis.  Given her age more than 20, she was recommended to start on hydroxyurea. - She takes hydroxyurea 500 mg daily started on 12/26/2017, increased to 2 tablets daily on 02/07/2018. -On 07/22/2018, her platelet count dropped to 72.  Hence we will cut back the dose of Hydrea to 2 tablets on Monday Wednesdays and Fridays and 1 tablet on the other days. - Labs on 03/25/2019 showed WBC 5.1, hemoglobin 12.7, hematocrit 38.7, platelet 231. -She will continue the same dose of hydroxyurea.  Our goal is to keep hematocrit below 45 and platelet count below 400. -Physical examination today did not reveal any splenomegaly or lymphadenopathy. -She denies any vasomotor symptoms or aquagenic pruritus.  She denies any leg ulcers or nausea. -We will see her back in 3 months for follow-up and repeat labs.  2.  Sleeplessness: - She is taking Xanax 0.25 mg as needed for sleep which is helping.  3.  Lung nodules: -Patient has a longtime smoking  history. -CT of the chest on 03/25/2019 showed multiple small pulmonary nodules are again noted in the lungs bilaterally, largest of which is in the posterior aspect of the right upper lobe near the apex.  -We will repeat a CT of the chest in August 2021.      Orders placed this encounter:  Orders Placed This Encounter  Procedures  . Lactate dehydrogenase  .  CBC with Differential/Platelet  . Comprehensive metabolic panel      Francene Finders, FNP-C [employer] [phone]

## 2019-04-01 NOTE — Patient Instructions (Signed)
[facility] at [facility] Discharge Instructions  Follow up in 3 months with lab s   Thank you for choosing [facility] at [facility] to provide your oncology and hematology care.  To afford each patient quality time with our provider, please arrive at least 15 minutes before your scheduled appointment time.   If you have a lab appointment with the Cancer Center please come in thru the Main Entrance and check in at the main information desk.  You need to re-schedule your appointment should you arrive 10 or more minutes late.  We strive to give you quality time with our providers, and arriving late affects you and other patients whose appointments are after yours.  Also, if you no show three or more times for appointments you may be dismissed from the clinic at the providers discretion.     Again, thank you for choosing [facility].  Our hope is that these requests will decrease the amount of time that you wait before being seen by our physicians.       _____________________________________________________________  Should you have questions after your visit to [facility], please contact our office at [phone] between the hours of 8:00 a.m. and 4:30 p.m.  Voicemails left after 4:00 p.m. will not be returned until the following business day.  For prescription refill requests, have your pharmacy contact our office and allow 72 hours.    Due to Covid, you will need to wear a mask upon entering the hospital. If you do not have a mask, a mask will be given to you at the Main Entrance upon arrival. For doctor visits, patients may have 1 support person with them. For treatment visits, patients can not have anyone with them due to social distancing guidelines and our immunocompromised population.      

## 2019-04-01 NOTE — Assessment & Plan Note (Signed)
1.  [name]: - She had a bone marrow aspiration and biopsy on 01/11/2018 shows hypercellular (50 to 70% cellularity) marrow with trilineage hematopoiesis.  Reticulin stain showed mild increase in reticulin fibers.  Chromosome analysis shows 8, XX. - No prior history of vasomotor symptoms or thrombosis.  Given her age more than 36, she was recommended to start on hydroxyurea. - She takes hydroxyurea 500 mg daily started on 12/26/2017, increased to 2 tablets daily on 02/07/2018. -On 07/22/2018, her platelet count dropped to 72.  Hence we will cut back the dose of Hydrea to 2 tablets on Monday Wednesdays and Fridays and 1 tablet on the other days. - Labs on 03/25/2019 showed WBC 5.1, hemoglobin 12.7, hematocrit 38.7, platelet 231. -She will continue the same dose of hydroxyurea.  Our goal is to keep hematocrit below 45 and platelet count below 400. -Physical examination today did not reveal any splenomegaly or lymphadenopathy. -She denies any vasomotor symptoms or aquagenic pruritus.  She denies any leg ulcers or nausea. -We will see her back in 3 months for follow-up and repeat labs.  2.  Sleeplessness: - She is taking Xanax 0.25 mg as needed for sleep which is helping.  3.  Lung nodules: -Patient has a longtime smoking history. -CT of the chest on 03/25/2019 showed multiple small pulmonary nodules are again noted in the lungs bilaterally, largest of which is in the posterior aspect of the right upper lobe near the apex.  -We will repeat a CT of the chest in August 2021.

## 2019-06-21 ENCOUNTER — Other Ambulatory Visit: Payer: Self-pay

## 2019-06-21 ENCOUNTER — Inpatient Hospital Stay (HOSPITAL_COMMUNITY): Payer: Medicare Other | Attending: Hematology

## 2019-06-21 DIAGNOSIS — R918 Other nonspecific abnormal finding of lung field: Secondary | ICD-10-CM | POA: Diagnosis not present

## 2019-06-21 DIAGNOSIS — G4700 Insomnia, unspecified: Secondary | ICD-10-CM | POA: Diagnosis not present

## 2019-06-21 DIAGNOSIS — D45 Polycythemia vera: Secondary | ICD-10-CM | POA: Insufficient documentation

## 2019-06-21 DIAGNOSIS — D471 Chronic myeloproliferative disease: Secondary | ICD-10-CM

## 2019-06-21 LAB — COMPREHENSIVE METABOLIC PANEL WITH GFR
ALT: 16 U/L (ref 0–44)
AST: 20 U/L (ref 15–41)
Albumin: 4.3 g/dL (ref 3.5–5.0)
Alkaline Phosphatase: 78 U/L (ref 38–126)
Anion gap: 10 (ref 5–15)
BUN: 11 mg/dL (ref 8–23)
CO2: 25 mmol/L (ref 22–32)
Calcium: 9.6 mg/dL (ref 8.9–10.3)
Chloride: 105 mmol/L (ref 98–111)
Creatinine, Ser: 0.6 mg/dL (ref 0.44–1.00)
GFR calc Af Amer: >60 mL/min
GFR calc non Af Amer: >60 mL/min
Glucose, Bld: 106 mg/dL — ABNORMAL HIGH (ref 70–99)
Potassium: 3.9 mmol/L (ref 3.5–5.1)
Sodium: 140 mmol/L (ref 135–145)
Total Bilirubin: 0.6 mg/dL (ref 0.3–1.2)
Total Protein: 7.3 g/dL (ref 6.5–8.1)

## 2019-06-21 LAB — CBC WITH DIFFERENTIAL/PLATELET
Abs Immature Granulocytes: 0.02 K/uL (ref 0.00–0.07)
Basophils Absolute: 0 K/uL (ref 0.0–0.1)
Basophils Relative: 0 %
Eosinophils Absolute: 0 K/uL (ref 0.0–0.5)
Eosinophils Relative: 0 %
HCT: 37.1 % (ref 36.0–46.0)
Hemoglobin: 12.4 g/dL (ref 12.0–15.0)
Immature Granulocytes: 0 %
Lymphocytes Relative: 31 %
Lymphs Abs: 1.9 K/uL (ref 0.7–4.0)
MCH: 39.4 pg — ABNORMAL HIGH (ref 26.0–34.0)
MCHC: 33.4 g/dL (ref 30.0–36.0)
MCV: 117.8 fL — ABNORMAL HIGH (ref 80.0–100.0)
Monocytes Absolute: 0.5 K/uL (ref 0.1–1.0)
Monocytes Relative: 9 %
Neutro Abs: 3.7 K/uL (ref 1.7–7.7)
Neutrophils Relative %: 60 %
Platelets: 230 K/uL (ref 150–400)
RBC: 3.15 MIL/uL — ABNORMAL LOW (ref 3.87–5.11)
RDW: 13.5 % (ref 11.5–15.5)
WBC: 6.1 K/uL (ref 4.0–10.5)
nRBC: 0 % (ref 0.0–0.2)

## 2019-06-21 LAB — LACTATE DEHYDROGENASE: LDH: 177 U/L (ref 98–192)

## 2019-06-22 ENCOUNTER — Other Ambulatory Visit (HOSPITAL_COMMUNITY): Payer: Self-pay | Admitting: *Deleted

## 2019-06-22 DIAGNOSIS — Z1589 Genetic susceptibility to other disease: Secondary | ICD-10-CM

## 2019-06-23 MED ORDER — HYDROXYUREA 500 MG PO CAPS: 1000.0000 mg | ORAL_CAPSULE | Freq: Every day | ORAL | 5 refills | Status: DC

## 2019-06-27 ENCOUNTER — Ambulatory Visit (HOSPITAL_COMMUNITY): Payer: Medicare Other | Admitting: Nurse Practitioner

## 2019-06-27 ENCOUNTER — Other Ambulatory Visit: Payer: Self-pay

## 2019-06-28 ENCOUNTER — Other Ambulatory Visit (HOSPITAL_COMMUNITY): Payer: Medicare Other

## 2019-06-28 ENCOUNTER — Ambulatory Visit (HOSPITAL_COMMUNITY): Payer: Medicare Other | Admitting: Nurse Practitioner

## 2019-06-28 ENCOUNTER — Encounter (HOSPITAL_COMMUNITY): Payer: Self-pay | Admitting: Nurse Practitioner

## 2019-06-28 ENCOUNTER — Inpatient Hospital Stay (HOSPITAL_COMMUNITY): Payer: Medicare Other | Admitting: Nurse Practitioner

## 2019-06-28 DIAGNOSIS — D45 Polycythemia vera: Secondary | ICD-10-CM | POA: Diagnosis not present

## 2019-06-28 DIAGNOSIS — D471 Chronic myeloproliferative disease: Secondary | ICD-10-CM

## 2019-06-28 DIAGNOSIS — R918 Other nonspecific abnormal finding of lung field: Secondary | ICD-10-CM | POA: Diagnosis not present

## 2019-06-28 DIAGNOSIS — G4700 Insomnia, unspecified: Secondary | ICD-10-CM | POA: Diagnosis not present

## 2019-06-28 NOTE — Patient Instructions (Signed)
[facility] at [facility] Discharge Instructions  Follow up in 3 months with lab s   Thank you for choosing  [facility] at [facility] to provide your oncology and hematology care.  To afford each patient quality time with our provider, please arrive at least 15 minutes before your scheduled appointment time.   If you have a lab appointment with the Cancer Center please come in thru the Main Entrance and check in at the main information desk.  You need to re-schedule your appointment should you arrive 10 or more minutes late.  We strive to give you quality time with our providers, and arriving late affects you and other patients whose appointments are after yours.  Also, if you no show three or more times for appointments you may be dismissed from the clinic at the providers discretion.     Again, thank you for choosing [facility].  Our hope is that these requests will decrease the amount of time that you wait before being seen by our physicians.       _____________________________________________________________  Should you have questions after your visit to [facility], please contact our office at [phone] between the hours of 8:00 a.m. and 4:30 p.m.  Voicemails left after 4:00 p.m. will not be returned until the following business day.  For prescription refill requests, have your pharmacy contact our office and allow 72 hours.    Due to Covid, you will need to wear a mask upon entering the hospital. If you do not have a mask, a mask will be given to you at the Main Entrance upon arrival. For doctor visits, patients may have 1 support person with them. For treatment visits, patients can not have anyone with them due to social distancing guidelines and our immunocompromised population.      

## 2019-06-28 NOTE — Progress Notes (Signed)
[address], [place] [address]   CLINIC:  Medical Oncology/Hematology  PCP:  Sharilyn Sites, [place] [id number] [phone]   REASON FOR VISIT: Follow-up for JAK2 positive polycythemia vera  CURRENT THERAPY: Hydrea 2 tabs on Monday, Wednesday, Friday. 1 tab on the other days.    INTERVAL HISTORY:  Denise Maynard 63 y.o. female returns for routine follow-up for JAK2 positive polycythemia vera.  Patient reports she has been doing well since her last visit.  She does have trouble sleeping at night at times.  Otherwise she denies any bright red bleeding per rectum or melena.  She denies any other vasomotor symptoms.  She denies any B symptoms. Denies any nausea, vomiting, or diarrhea. Denies any new pains. Had not noticed any recent bleeding such as epistaxis, hematuria or hematochezia. Denies recent chest pain on exertion, shortness of breath on minimal exertion, pre-syncopal episodes, or palpitations. Denies any numbness or tingling in hands or feet. Denies any recent fevers, infections, or recent hospitalizations. Patient reports appetite at 100% and energy level at 100%.  She is eating well maintain her weight this time.     REVIEW OF SYSTEMS:  Review of Systems  All other systems reviewed and are negative.    PAST MEDICAL/SURGICAL HISTORY:  Past Medical History:  Diagnosis Date  . Carotid artery aneurysm (HCC)    Coil embolization of a supraclinoid right internal carotid artery - Dr. Kathyrn Sheriff  . Hyperlipidemia   . Nicotine addiction 05/15/2015   Will try patch  . Serum calcium elevated 11/19/2017   recheck  . Serum potassium elevated 11/19/2017  . Stroke [facility])    Past Surgical History:  Procedure Laterality Date  . ABDOMINAL AORTAGRAM N/A 11/24/2014   Procedure: ABDOMINAL Maxcine Ham;  Surgeon: Elam Dutch, MD;  Location: [facility] CATH LAB;  Service: Cardiovascular;  Laterality: N/A;  . Aneursym repair  10/2014  .  COLONOSCOPY N/A 07/30/2015   Procedure: COLONOSCOPY;  Surgeon: Danie Binder, MD;  Location: [facility] ENDO SUITE;  Service: Endoscopy;  Laterality: N/A;  1:00 Pm - moved to 1:30 - office to notify  . FRACTURE SURGERY     Jaw  . LOWER EXTREMITY ANGIOGRAM  11/24/2014   Procedure: LOWER EXTREMITY ANGIOGRAM;  Surgeon: Elam Dutch, MD;  Location: [facility] CATH LAB;  Service: Cardiovascular;;  . MOUTH SURGERY    . RADIOLOGY WITH ANESTHESIA N/A 10/12/2014   Procedure: Embolization/arteriogram;  Surgeon: Consuella Lose, MD;  Location: [place];  Service: Radiology;  Laterality: N/A;     SOCIAL HISTORY:  Social History   Socioeconomic History  . Marital status: Divorced    Spouse name: Not on file  . Number of children: Not on file  . Years of education: Not on file  . Highest education level: Not on file  Occupational History  . Not on file  Social Needs  . Financial resource strain: Not on file  . Food insecurity    Worry: Not on file    Inability: Not on file  . Transportation needs    Medical: Not on file    Non-medical: Not on file  Tobacco Use  . Smoking status: Current Every Day Smoker    Packs/day: 0.50    Years: 30.00    Pack years: 15.00    Types: Cigarettes  . Smokeless tobacco: Never Used  Substance and Sexual Activity  . Alcohol use: No    Alcohol/week: 0.0 standard drinks  . Drug use:  No  . Sexual activity: Yes    Birth control/protection: Post-menopausal  Lifestyle  . Physical activity    Days per week: Not on file    Minutes per session: Not on file  . Stress: Not on file  Relationships  . Social Herbalist on phone: Not on file    Gets together: Not on file    Attends religious service: Not on file    Active member of club or organization: Not on file    Attends meetings of clubs or organizations: Not on file    Relationship status: Not on file  . Intimate partner violence    Fear of current or ex partner: Not on file    Emotionally abused: Not on  file    Physically abused: Not on file    Forced sexual activity: Not on file  Other Topics Concern  . Not on file  Social History Narrative  . Not on file    FAMILY HISTORY:  Family History  Problem Relation Age of Onset  . Heart attack Mother   . Diabetes Mother   . Throat cancer Father   . Hypertension Father   . Hyperlipidemia Sister   . Diabetes Maternal Grandmother   . Cancer Maternal Grandfather   . Heart attack Paternal Grandfather   . Hyperlipidemia Sister     CURRENT MEDICATIONS:  Outpatient Encounter Medications as of 06/28/2019  Medication Sig  . aspirin EC 81 MG tablet Take 81 mg by mouth daily.  . cholecalciferol (VITAMIN D) 1000 UNITS tablet Take 2,000 Units by mouth daily.   . hydroxyurea (HYDREA) 500 MG capsule Take 2 capsules (1,000 mg total) by mouth daily. May take with food to minimize GI side effects.  . Multiple Vitamins-Minerals (MULTIVITAMIN WITH MINERALS) tablet Take 1 tablet by mouth daily.  . Omega-3 Fatty Acids (FISH OIL) 1200 MG CAPS Take 1,200 mg by mouth 2 (two) times daily.  Marland Kitchen acetaminophen (TYLENOL) 500 MG tablet Take 1,000 mg by mouth every 6 (six) hours as needed for mild pain or moderate pain.   Marland Kitchen ALPRAZolam (XANAX) 0.25 MG tablet Take 1 tablet (0.25 mg total) by mouth at bedtime as needed for anxiety. (Patient not taking: Reported on 06/28/2019)  . Artificial Tear Ointment (DRY EYES OP) Apply to eye.   No facility-administered encounter medications on file as of 06/28/2019.     ALLERGIES:  No Known Allergies   PHYSICAL EXAM:  ECOG Performance status: 1  Vitals:   06/28/19 0930  BP: (!) 115/55  Pulse: 60  Resp: 18  Temp: (!) 97.1 F (36.2 C)  SpO2: 100%   Filed Weights   06/28/19 0930  Weight: 138 lb 9.6 oz (62.9 kg)    Physical Exam Constitutional:      Appearance: Normal appearance. She is normal weight.  Cardiovascular:     Rate and Rhythm: Normal rate and regular rhythm.     Heart sounds: Normal heart sounds.   Pulmonary:     Effort: Pulmonary effort is normal.     Breath sounds: Normal breath sounds.  Abdominal:     General: Bowel sounds are normal.     Palpations: Abdomen is soft.  Musculoskeletal: Normal range of motion.  Skin:    General: Skin is warm.  Neurological:     Mental Status: She is alert and oriented to person, place, and time. Mental status is at baseline.  Psychiatric:        Mood and Affect: Mood normal.  Behavior: Behavior normal.        Thought Content: Thought content normal.        Judgment: Judgment normal.      LABORATORY DATA:  I have reviewed the labs as listed.  CBC    Component Value Date/Time   WBC 6.1 06/21/2019 1200   RBC 3.15 (L) 06/21/2019 1200   HGB 12.4 06/21/2019 1200   HGB 17.2 (H) 11/30/2017 0841   HCT 37.1 06/21/2019 1200   HCT 51.4 (H) 11/30/2017 0841   PLT 230 06/21/2019 1200   PLT 900 (HH) 11/30/2017 0841   MCV 117.8 (H) 06/21/2019 1200   MCV 95 11/30/2017 0841   MCH 39.4 (H) 06/21/2019 1200   MCHC 33.4 06/21/2019 1200   RDW 13.5 06/21/2019 1200   RDW 15.7 (H) 11/30/2017 0841   LYMPHSABS 1.9 06/21/2019 1200   LYMPHSABS 2.2 11/30/2017 0841   MONOABS 0.5 06/21/2019 1200   EOSABS 0.0 06/21/2019 1200   EOSABS 0.2 11/30/2017 0841   BASOSABS 0.0 06/21/2019 1200   BASOSABS 0.1 11/30/2017 0841   CMP Latest Ref Rng & Units 06/21/2019 03/25/2019 12/24/2018  Glucose 70 - 99 mg/dL 106(H) 107(H) 93  BUN 8 - 23 mg/dL 11 9 11   Creatinine 0.44 - 1.00 mg/dL 0.60 0.58 0.64  Sodium 135 - 145 mmol/L 140 140 138  Potassium 3.5 - 5.1 mmol/L 3.9 4.3 4.4  Chloride 98 - 111 mmol/L 105 107 103  CO2 22 - 32 mmol/L 25 25 25   Calcium 8.9 - 10.3 mg/dL 9.6 9.5 9.9  Total Protein 6.5 - 8.1 g/dL 7.3 7.4 7.8  Total Bilirubin 0.3 - 1.2 mg/dL 0.6 0.4 0.7  Alkaline Phos 38 - 126 U/L 78 71 80  AST 15 - 41 U/L 20 21 20   ALT 0 - 44 U/L 16 17 17       I personally performed a face-to-face visit.  All questions were answered to patient's stated  satisfaction. Encouraged patient to call with any new concerns or questions before his next visit to the cancer center and we can certain see him sooner, if needed.     ASSESSMENT & PLAN:   Myeloproliferative disorder ([place]) 1.  [name]: - She had a bone marrow aspiration and biopsy on 01/11/2018 shows hypercellular (50 to 70% cellularity) marrow with trilineage hematopoiesis.  Reticulin stain showed mild increase in reticulin fibers.  Chromosome analysis shows 49, XX. - No prior history of vasomotor symptoms or thrombosis.  Given her age more than 54, she was recommended to start on hydroxyurea. - She takes hydroxyurea 500 mg daily started on 12/26/2017, increased to 2 tablets daily on 02/07/2018. -On 07/22/2018, her platelet count dropped to 72.  Hence we will cut back the dose of Hydrea to 2 tablets on Monday Wednesdays and Fridays and 1 tablet on the other days. - Labs on 06/21/2019 showed WBC 6.1, hemoglobin 12.4, hematocrit 37.1, platelets 230. -She will continue the same dose of hydroxyurea.  Our goal is to keep hematocrit below 45 and platelet count below 400. -Physical examination today did not reveal any splenomegaly or lymphadenopathy. -She denies any vasomotor symptoms or aquagenic pruritus.  She denies any leg ulcers or nausea. -We will see her back in 3 months for follow-up and repeat labs.  2.  Sleeplessness: - She is taking Xanax 0.25 mg as needed for sleep which is helping.  3.  Lung nodules: -Patient has a longtime smoking history. -CT of the chest on 03/25/2019 showed multiple small pulmonary  nodules are again noted in the lungs bilaterally, largest of which is in the posterior aspect of the right upper lobe near the apex.  -We will repeat a CT of the chest in August 2021.      Orders placed this encounter:  Orders Placed This Encounter  Procedures  . CBC with Differential/Platelet  . Comprehensive metabolic panel  . Lactate dehydrogenase  . Vitamin B12   . Vitamin D 25 hydroxy      Francene Finders, FNP-C [facility] [phone]

## 2019-06-28 NOTE — Assessment & Plan Note (Addendum)
1.  [name]: - She had a bone marrow aspiration and biopsy on 01/11/2018 shows hypercellular (50 to 70% cellularity) marrow with trilineage hematopoiesis.  Reticulin stain showed mild increase in reticulin fibers.  Chromosome analysis shows 62, XX. - No prior history of vasomotor symptoms or thrombosis.  Given her age more than 55, she was recommended to start on hydroxyurea. - She takes hydroxyurea 500 mg daily started on 12/26/2017, increased to 2 tablets daily on 02/07/2018. -On 07/22/2018, her platelet count dropped to 72.  Hence we will cut back the dose of Hydrea to 2 tablets on Monday Wednesdays and Fridays and 1 tablet on the other days. - Labs on 06/21/2019 showed WBC 6.1, hemoglobin 12.4, hematocrit 37.1, platelets 230. -She will continue the same dose of hydroxyurea.  Our goal is to keep hematocrit below 45 and platelet count below 400. -Physical examination today did not reveal any splenomegaly or lymphadenopathy. -She denies any vasomotor symptoms or aquagenic pruritus.  She denies any leg ulcers or nausea. -We will see her back in 3 months for follow-up and repeat labs.  2.  Sleeplessness: - She is taking Xanax 0.25 mg as needed for sleep which is helping.  3.  Lung nodules: -Patient has a longtime smoking history. -CT of the chest on 03/25/2019 showed multiple small pulmonary nodules are again noted in the lungs bilaterally, largest of which is in the posterior aspect of the right upper lobe near the apex.  -We will repeat a CT of the chest in August 2021.

## 2019-09-27 ENCOUNTER — Inpatient Hospital Stay (HOSPITAL_COMMUNITY): Payer: Medicare Other | Attending: Hematology

## 2019-09-27 ENCOUNTER — Other Ambulatory Visit: Payer: Self-pay

## 2019-09-27 DIAGNOSIS — D45 Polycythemia vera: Secondary | ICD-10-CM | POA: Diagnosis not present

## 2019-09-27 DIAGNOSIS — R918 Other nonspecific abnormal finding of lung field: Secondary | ICD-10-CM | POA: Diagnosis not present

## 2019-09-27 DIAGNOSIS — D471 Chronic myeloproliferative disease: Secondary | ICD-10-CM

## 2019-09-27 LAB — CBC WITH DIFFERENTIAL/PLATELET
Abs Immature Granulocytes: 0.02 10*3/uL (ref 0.00–0.07)
Basophils Absolute: 0 10*3/uL (ref 0.0–0.1)
Basophils Relative: 0 %
Eosinophils Absolute: 0 10*3/uL (ref 0.0–0.5)
Eosinophils Relative: 0 %
HCT: 39.7 % (ref 36.0–46.0)
Hemoglobin: 13.2 g/dL (ref 12.0–15.0)
Immature Granulocytes: 0 %
Lymphocytes Relative: 24 %
Lymphs Abs: 1.5 10*3/uL (ref 0.7–4.0)
MCH: 39.2 pg — ABNORMAL HIGH (ref 26.0–34.0)
MCHC: 33.2 g/dL (ref 30.0–36.0)
MCV: 117.8 fL — ABNORMAL HIGH (ref 80.0–100.0)
Monocytes Absolute: 0.4 10*3/uL (ref 0.1–1.0)
Monocytes Relative: 7 %
Neutro Abs: 4.3 10*3/uL (ref 1.7–7.7)
Neutrophils Relative %: 69 %
Platelets: 235 10*3/uL (ref 150–400)
RBC: 3.37 MIL/uL — ABNORMAL LOW (ref 3.87–5.11)
RDW: 13.6 % (ref 11.5–15.5)
WBC: 6.3 10*3/uL (ref 4.0–10.5)
nRBC: 0 % (ref 0.0–0.2)

## 2019-09-27 LAB — VITAMIN B12: Vitamin B-12: 708 pg/mL (ref 180–914)

## 2019-09-27 LAB — COMPREHENSIVE METABOLIC PANEL
ALT: 17 U/L (ref 0–44)
AST: 21 U/L (ref 15–41)
Albumin: 4.4 g/dL (ref 3.5–5.0)
Alkaline Phosphatase: 76 U/L (ref 38–126)
Anion gap: 10 (ref 5–15)
BUN: 12 mg/dL (ref 8–23)
CO2: 27 mmol/L (ref 22–32)
Calcium: 9.8 mg/dL (ref 8.9–10.3)
Chloride: 104 mmol/L (ref 98–111)
Creatinine, Ser: 0.64 mg/dL (ref 0.44–1.00)
GFR calc Af Amer: >60 mL/min (ref >60)
GFR calc non Af Amer: >60 mL/min (ref >60)
Glucose, Bld: 135 mg/dL — ABNORMAL HIGH (ref 70–99)
Potassium: 4.5 mmol/L (ref 3.5–5.1)
Sodium: 141 mmol/L (ref 135–145)
Total Bilirubin: 0.6 mg/dL (ref 0.3–1.2)
Total Protein: 8 g/dL (ref 6.5–8.1)

## 2019-09-27 LAB — VITAMIN D 25 HYDROXY (VIT D DEFICIENCY, FRACTURES): Vit D, 25-Hydroxy: 42.49 ng/mL (ref 30–100)

## 2019-09-27 LAB — LACTATE DEHYDROGENASE: LDH: 169 U/L (ref 98–192)

## 2019-10-04 ENCOUNTER — Inpatient Hospital Stay (HOSPITAL_COMMUNITY): Payer: Medicare Other | Admitting: Nurse Practitioner

## 2019-10-04 ENCOUNTER — Other Ambulatory Visit: Payer: Self-pay

## 2019-10-04 DIAGNOSIS — Z6824 Body mass index (BMI) 24.0-24.9, adult: Secondary | ICD-10-CM | POA: Diagnosis not present

## 2019-10-04 DIAGNOSIS — D471 Chronic myeloproliferative disease: Secondary | ICD-10-CM | POA: Diagnosis not present

## 2019-10-04 DIAGNOSIS — Z0000 Encounter for general adult medical examination without abnormal findings: Secondary | ICD-10-CM | POA: Diagnosis not present

## 2019-10-04 DIAGNOSIS — R918 Other nonspecific abnormal finding of lung field: Secondary | ICD-10-CM | POA: Diagnosis not present

## 2019-10-04 DIAGNOSIS — D45 Polycythemia vera: Secondary | ICD-10-CM | POA: Diagnosis not present

## 2019-10-04 DIAGNOSIS — M5030 Other cervical disc degeneration, unspecified cervical region: Secondary | ICD-10-CM | POA: Diagnosis not present

## 2019-10-04 NOTE — Assessment & Plan Note (Addendum)
1.  [name]: - She had a bone marrow aspiration and biopsy on 01/11/2018 shows hypercellular (50 to 70% cellularity) marrow with trilineage hematopoiesis.  Reticulin stain showed mild increase in reticulin fibers.  Chromosome analysis shows 7, XX. - No prior history of vasomotor symptoms or thrombosis.  Given her age more than 89, she was recommended to start on hydroxyurea. - She takes hydroxyurea 500 mg daily started on 12/26/2017, increased to 2 tablets daily on 02/07/2018. -On 07/22/2018, her platelet count dropped to 72.  Hence we will cut back the dose of Hydrea to 2 tablets on Monday Wednesdays and Fridays and 1 tablet on the other days. -Labs done on 09/27/2019 showed hemoglobin 13.2, hematocrit 39.7, platelets 235 -She will continue the same dose of hydroxyurea.  Our goal is to keep hematocrit below 45 and platelet count below 400. -Physical examination today did not reveal any splenomegaly or lymphadenopathy. -She denies any vasomotor symptoms or aquagenic pruritus.  She denies any leg ulcers or nausea. -We will see her back in 3 months for follow-up and repeat labs.  2.  Sleeplessness: - She is taking Xanax 0.25 mg as needed for sleep which is helping.  3.  Lung nodules: -Patient has a longtime smoking history. -CT of the chest on 03/25/2019 showed multiple small pulmonary nodules are again noted in the lungs bilaterally, largest of which is in the posterior aspect of the right upper lobe near the apex.  -We will repeat a CT of the chest in August 2021.

## 2019-10-04 NOTE — Patient Instructions (Signed)
[facility] at [facility] Discharge Instructions  Follow up in 3 months with lab s   Thank you for choosing [facility] at [facility] to provide your oncology and hematology care.  To afford each patient quality time with our provider, please arrive at least 15 minutes before your scheduled appointment time.   If you have a lab appointment with the Cancer Center please come in thru the Main Entrance and check in at the main information desk.  You need to re-schedule your appointment should you arrive 10 or more minutes late.  We strive to give you quality time with our providers, and arriving late affects you and other patients whose appointments are after yours.  Also, if you no show three or more times for appointments you may be dismissed from the clinic at the providers discretion.     Again, thank you for choosing [facility].  Our hope is that these requests will decrease the amount of time that you wait before being seen by our physicians.       _____________________________________________________________  Should you have questions after your visit to [facility], please contact our office at [phone] between the hours of 8:00 a.m. and 4:30 p.m.  Voicemails left after 4:00 p.m. will not be returned until the following business day.  For prescription refill requests, have your pharmacy contact our office and allow 72 hours.    Due to Covid, you will need to wear a mask upon entering the hospital. If you do not have a mask, a mask will be given to you at the Main Entrance upon arrival. For doctor visits, patients may have 1 support person with them. For treatment visits, patients can not have anyone with them due to social distancing guidelines and our immunocompromised population.      

## 2019-10-04 NOTE — Progress Notes (Signed)
[place], [place] [address]   CLINIC:  Medical Oncology/Hematology  PCP:  Sharilyn Sites, [place] [phone]   REASON FOR VISIT: Follow-up for polycythemia  CURRENT THERAPY: Hydrea two tabs Monday Wednesday Friday, one tab on the other days.   INTERVAL HISTORY:  Denise Maynard 64 y.o. female returns for routine follow-up for polycythemia.  Patient reports she has been taking her medication as prescribed.  Patient denies any signs or symptoms with the medications.  She is tolerating it well.  She denies any aqua genic pruritus.  She denies any leg ulcers.  She denies any hot flashes. Denies any nausea, vomiting, or diarrhea. Denies any new pains. Had not noticed any recent bleeding such as epistaxis, hematuria or hematochezia. Denies recent chest pain on exertion, shortness of breath on minimal exertion, pre-syncopal episodes, or palpitations. Denies any numbness or tingling in hands or feet. Denies any recent fevers, infections, or recent hospitalizations. Patient reports appetite at 100% and energy level at 75%.  She is eating well maintain her weight at this time.    REVIEW OF SYSTEMS:  Review of Systems  All other systems reviewed and are negative.    PAST MEDICAL/SURGICAL HISTORY:  Past Medical History:  Diagnosis Date  . Carotid artery aneurysm (HCC)    Coil embolization of a supraclinoid right internal carotid artery - Dr. Kathyrn Sheriff  . Hyperlipidemia   . Nicotine addiction 05/15/2015   Will try patch  . Serum calcium elevated 11/19/2017   recheck  . Serum potassium elevated 11/19/2017  . Stroke [facility])    Past Surgical History:  Procedure Laterality Date  . ABDOMINAL AORTAGRAM N/A 11/24/2014   Procedure: ABDOMINAL Maxcine Ham;  Surgeon: Elam Dutch, MD;  Location: [facility] CATH LAB;  Service: Cardiovascular;  Laterality: N/A;  . Aneursym repair  10/2014  . COLONOSCOPY N/A 07/30/2015   Procedure:  COLONOSCOPY;  Surgeon: Danie Binder, MD;  [facility];  Service: Endoscopy;  Laterality: N/A;  1:00 Pm - moved to 1:30 - office to notify  . FRACTURE SURGERY     Jaw  . LOWER EXTREMITY ANGIOGRAM  11/24/2014   Procedure: LOWER EXTREMITY ANGIOGRAM;  Surgeon: Elam Dutch, MD;  Location: [facility] CATH LAB;  Service: Cardiovascular;;  . MOUTH SURGERY    . RADIOLOGY WITH ANESTHESIA N/A 10/12/2014   Procedure: Embolization/arteriogram;  Surgeon: Consuella Lose, MD;  Location: [place];  Service: Radiology;  Laterality: N/A;     SOCIAL HISTORY:  Social History   Socioeconomic History  . Marital status: Divorced    Spouse name: Not on file  . Number of children: Not on file  . Years of education: Not on file  . Highest education level: Not on file  Occupational History  . Not on file  Tobacco Use  . Smoking status: Current Every Day Smoker    Packs/day: 0.50    Years: 30.00    Pack years: 15.00    Types: Cigarettes  . Smokeless tobacco: Never Used  Substance and Sexual Activity  . Alcohol use: No    Alcohol/week: 0.0 standard drinks  . Drug use: No  . Sexual activity: Yes    Birth control/protection: Post-menopausal  Other Topics Concern  . Not on file  Social History Narrative  . Not on file   Social Determinants of Health   Financial Resource Strain:   . Difficulty of Paying Living Expenses: Not on file  Food Insecurity:   .  Worried About Charity fundraiser in the Last Year: Not on file  . Ran Out of Food in the Last Year: Not on file  Transportation Needs:   . Lack of Transportation (Medical): Not on file  . Lack of Transportation (Non-Medical): Not on file  Physical Activity:   . Days of Exercise per Week: Not on file  . Minutes of Exercise per Session: Not on file  Stress:   . Feeling of Stress : Not on file  Social Connections:   . Frequency of Communication with Friends and Family: Not on file  . Frequency of Social Gatherings with Friends and Family:  Not on file  . Attends Religious Services: Not on file  . Active Member of Clubs or Organizations: Not on file  . Attends Archivist Meetings: Not on file  . Marital Status: Not on file  Intimate Partner Violence:   . Fear of Current or Ex-Partner: Not on file  . Emotionally Abused: Not on file  . Physically Abused: Not on file  . Sexually Abused: Not on file    FAMILY HISTORY:  Family History  Problem Relation Age of Onset  . Heart attack Mother   . Diabetes Mother   . Throat cancer Father   . Hypertension Father   . Hyperlipidemia Sister   . Diabetes Maternal Grandmother   . Cancer Maternal Grandfather   . Heart attack Paternal Grandfather   . Hyperlipidemia Sister     CURRENT MEDICATIONS:  Outpatient Encounter Medications as of 10/04/2019  Medication Sig  . Artificial Tear Ointment (DRY EYES OP) Apply to eye daily.   Marland Kitchen aspirin EC 81 MG tablet Take 81 mg by mouth daily.  . cholecalciferol (VITAMIN D) 1000 UNITS tablet Take 2,000 Units by mouth daily.   . hydroxyurea (HYDREA) 500 MG capsule Take 2 capsules (1,000 mg total) by mouth daily. May take with food to minimize GI side effects.  . Multiple Vitamins-Minerals (MULTIVITAMIN WITH MINERALS) tablet Take 1 tablet by mouth daily.  . Omega-3 Fatty Acids (FISH OIL) 1200 MG CAPS Take 1,200 mg by mouth 2 (two) times daily.  Marland Kitchen acetaminophen (TYLENOL) 500 MG tablet Take 1,000 mg by mouth every 6 (six) hours as needed for mild pain or moderate pain.   Marland Kitchen ALPRAZolam (XANAX) 0.25 MG tablet Take 1 tablet (0.25 mg total) by mouth at bedtime as needed for anxiety. (Patient not taking: Reported on 06/28/2019)   No facility-administered encounter medications on file as of 10/04/2019.    ALLERGIES:  No Known Allergies   PHYSICAL EXAM:  ECOG Performance status: 1  Vitals:   10/04/19 1014  BP: (!) 107/56  Pulse: 65  Resp: 16  Temp: 97.8 F (36.6 C)  SpO2: 100%   Filed Weights   10/04/19 1014  Weight: 136 lb 9.6 oz  (62 kg)    Physical Exam Constitutional:      Appearance: Normal appearance. She is normal weight.  Cardiovascular:     Rate and Rhythm: Normal rate and regular rhythm.     Heart sounds: Normal heart sounds.  Pulmonary:     Effort: Pulmonary effort is normal.     Breath sounds: Normal breath sounds.  Abdominal:     General: Bowel sounds are normal.     Palpations: Abdomen is soft.  Musculoskeletal:        General: Normal range of motion.  Skin:    General: Skin is warm.  Neurological:     Mental Status: She is  alert and oriented to person, place, and time. Mental status is at baseline.  Psychiatric:        Mood and Affect: Mood normal.        Behavior: Behavior normal.        Thought Content: Thought content normal.        Judgment: Judgment normal.      LABORATORY DATA:  I have reviewed the labs as listed.  CBC    Component Value Date/Time   WBC 6.3 09/27/2019 0955   RBC 3.37 (L) 09/27/2019 0955   HGB 13.2 09/27/2019 0955   HGB 17.2 (H) 11/30/2017 0841   HCT 39.7 09/27/2019 0955   HCT 51.4 (H) 11/30/2017 0841   PLT 235 09/27/2019 0955   PLT 900 (HH) 11/30/2017 0841   MCV 117.8 (H) 09/27/2019 0955   MCV 95 11/30/2017 0841   MCH 39.2 (H) 09/27/2019 0955   MCHC 33.2 09/27/2019 0955   RDW 13.6 09/27/2019 0955   RDW 15.7 (H) 11/30/2017 0841   LYMPHSABS 1.5 09/27/2019 0955   LYMPHSABS 2.2 11/30/2017 0841   MONOABS 0.4 09/27/2019 0955   EOSABS 0.0 09/27/2019 0955   EOSABS 0.2 11/30/2017 0841   BASOSABS 0.0 09/27/2019 0955   BASOSABS 0.1 11/30/2017 0841   CMP Latest Ref Rng & Units 09/27/2019 06/21/2019 03/25/2019  Glucose 70 - 99 mg/dL 135(H) 106(H) 107(H)  BUN 8 - 23 mg/dL 12 11 9   Creatinine 0.44 - 1.00 mg/dL 0.64 0.60 0.58  Sodium 135 - 145 mmol/L 141 140 140  Potassium 3.5 - 5.1 mmol/L 4.5 3.9 4.3  Chloride 98 - 111 mmol/L 104 105 107  CO2 22 - 32 mmol/L 27 25 25   Calcium 8.9 - 10.3 mg/dL 9.8 9.6 9.5  Total Protein 6.5 - 8.1 g/dL 8.0 7.3 7.4  Total  Bilirubin 0.3 - 1.2 mg/dL 0.6 0.6 0.4  Alkaline Phos 38 - 126 U/L 76 78 71  AST 15 - 41 U/L 21 20 21   ALT 0 - 44 U/L 17 16 17      I personally performed a face-to-face visit.  All questions were answered to patient's stated satisfaction. Encouraged patient to call with any new concerns or questions before his next visit to the cancer center and we can certain see him sooner, if needed.     ASSESSMENT & PLAN:   Myeloproliferative disorder ([place]) 1.  [name]: - She had a bone marrow aspiration and biopsy on 01/11/2018 shows hypercellular (50 to 70% cellularity) marrow with trilineage hematopoiesis.  Reticulin stain showed mild increase in reticulin fibers.  Chromosome analysis shows 9, XX. - No prior history of vasomotor symptoms or thrombosis.  Given her age more than 18, she was recommended to start on hydroxyurea. - She takes hydroxyurea 500 mg daily started on 12/26/2017, increased to 2 tablets daily on 02/07/2018. -On 07/22/2018, her platelet count dropped to 72.  Hence we will cut back the dose of Hydrea to 2 tablets on Monday Wednesdays and Fridays and 1 tablet on the other days. -Labs done on 09/27/2019 showed hemoglobin 13.2, hematocrit 39.7, platelets 235 -She will continue the same dose of hydroxyurea.  Our goal is to keep hematocrit below 45 and platelet count below 400. -Physical examination today did not reveal any splenomegaly or lymphadenopathy. -She denies any vasomotor symptoms or aquagenic pruritus.  She denies any leg ulcers or nausea. -We will see her back in 3 months for follow-up and repeat labs.  2.  Sleeplessness: - She is taking  Xanax 0.25 mg as needed for sleep which is helping.  3.  Lung nodules: -Patient has a longtime smoking history. -CT of the chest on 03/25/2019 showed multiple small pulmonary nodules are again noted in the lungs bilaterally, largest of which is in the posterior aspect of the right upper lobe near the apex.  -We will repeat a CT  of the chest in August 2021.      Orders placed this encounter:  Orders Placed This Encounter  Procedures  . Lactate dehydrogenase  . CBC with Differential/Platelet  . Comprehensive metabolic panel      Francene Finders, FNP-C [place] [phone]

## 2019-10-24 ENCOUNTER — Other Ambulatory Visit (HOSPITAL_COMMUNITY): Payer: Self-pay | Admitting: *Deleted

## 2019-10-24 DIAGNOSIS — Z1589 Genetic susceptibility to other disease: Secondary | ICD-10-CM

## 2019-10-24 MED ORDER — HYDROXYUREA 500 MG PO CAPS: 1000.0000 mg | ORAL_CAPSULE | Freq: Every day | ORAL | 5 refills | Status: DC

## 2019-12-29 ENCOUNTER — Inpatient Hospital Stay (HOSPITAL_COMMUNITY): Payer: Medicare Other

## 2020-01-04 ENCOUNTER — Inpatient Hospital Stay (HOSPITAL_COMMUNITY): Payer: Medicare Other | Attending: Hematology

## 2020-01-04 DIAGNOSIS — D471 Chronic myeloproliferative disease: Secondary | ICD-10-CM

## 2020-01-04 DIAGNOSIS — D45 Polycythemia vera: Secondary | ICD-10-CM | POA: Insufficient documentation

## 2020-01-04 LAB — CBC WITH DIFFERENTIAL/PLATELET
Abs Immature Granulocytes: 0.02 10*3/uL (ref 0.00–0.07)
Basophils Absolute: 0 10*3/uL (ref 0.0–0.1)
Basophils Relative: 0 %
Eosinophils Absolute: 0 10*3/uL (ref 0.0–0.5)
Eosinophils Relative: 0 %
HCT: 36.9 % (ref 36.0–46.0)
Hemoglobin: 12.3 g/dL (ref 12.0–15.0)
Immature Granulocytes: 0 %
Lymphocytes Relative: 21 %
Lymphs Abs: 1.2 10*3/uL (ref 0.7–4.0)
MCH: 39.9 pg — ABNORMAL HIGH (ref 26.0–34.0)
MCHC: 33.3 g/dL (ref 30.0–36.0)
MCV: 119.8 fL — ABNORMAL HIGH (ref 80.0–100.0)
Monocytes Absolute: 0.6 10*3/uL (ref 0.1–1.0)
Monocytes Relative: 11 %
Neutro Abs: 3.8 10*3/uL (ref 1.7–7.7)
Neutrophils Relative %: 68 %
Platelets: 220 10*3/uL (ref 150–400)
RBC: 3.08 MIL/uL — ABNORMAL LOW (ref 3.87–5.11)
RDW: 14.2 % (ref 11.5–15.5)
WBC: 5.6 10*3/uL (ref 4.0–10.5)
nRBC: 0 % (ref 0.0–0.2)

## 2020-01-04 LAB — COMPREHENSIVE METABOLIC PANEL
ALT: 15 U/L (ref 0–44)
AST: 20 U/L (ref 15–41)
Albumin: 4.2 g/dL (ref 3.5–5.0)
Alkaline Phosphatase: 69 U/L (ref 38–126)
Anion gap: 9 (ref 5–15)
BUN: 9 mg/dL (ref 8–23)
CO2: 26 mmol/L (ref 22–32)
Calcium: 9.4 mg/dL (ref 8.9–10.3)
Chloride: 103 mmol/L (ref 98–111)
Creatinine, Ser: 0.67 mg/dL (ref 0.44–1.00)
GFR calc Af Amer: >60 mL/min (ref >60)
GFR calc non Af Amer: >60 mL/min (ref >60)
Glucose, Bld: 103 mg/dL — ABNORMAL HIGH (ref 70–99)
Potassium: 4.3 mmol/L (ref 3.5–5.1)
Sodium: 138 mmol/L (ref 135–145)
Total Bilirubin: 0.8 mg/dL (ref 0.3–1.2)
Total Protein: 7.3 g/dL (ref 6.5–8.1)

## 2020-01-04 LAB — LACTATE DEHYDROGENASE: LDH: 168 U/L (ref 98–192)

## 2020-01-05 ENCOUNTER — Other Ambulatory Visit: Payer: Self-pay

## 2020-01-05 ENCOUNTER — Inpatient Hospital Stay (HOSPITAL_BASED_OUTPATIENT_CLINIC_OR_DEPARTMENT_OTHER): Payer: Medicare Other | Admitting: Nurse Practitioner

## 2020-01-05 DIAGNOSIS — D471 Chronic myeloproliferative disease: Secondary | ICD-10-CM

## 2020-01-05 DIAGNOSIS — Z122 Encounter for screening for malignant neoplasm of respiratory organs: Secondary | ICD-10-CM | POA: Diagnosis not present

## 2020-01-05 NOTE — Assessment & Plan Note (Addendum)
1.  [name]: - She had a bone marrow aspiration and biopsy on 01/11/2018 shows hypercellular (50 to 70% cellularity) marrow with trilineage hematopoiesis.  Reticulin stain showed mild increase in reticulin fibers.  Chromosome analysis shows 60, XX. - No prior history of vasomotor symptoms or thrombosis.  Given her age more than 63, she was recommended to start on hydroxyurea. - She takes hydroxyurea 500 mg daily started on 12/26/2017, increased to 2 tablets daily on 02/07/2018. -On 07/22/2018, her platelet count dropped to 72.  Hence we will cut back the dose of Hydrea to 2 tablets on Monday Wednesdays and Fridays and 1 tablet on the other days. -Labs done on 01/04/2020 showed hemoglobin 12.3, hematocrit 36.9, platelets 220 -She will continue the same dose of hydroxyurea.  Our goal is to keep hematocrit below 45 and platelet count below 400. -She denies any vasomotor symptoms or aquagenic pruritus.  She denies any leg ulcers or nausea. -We will see her back in 3 months for follow-up and repeat labs.  2.  Sleeplessness: - She is taking Xanax 0.25 mg as needed for sleep which is helping.  3.  Lung nodules: -Patient has a longtime smoking history. -CT of the chest on 03/25/2019 showed multiple small pulmonary nodules are again noted in the lungs bilaterally, largest of which is in the posterior aspect of the right upper lobe near the apex.  -We will repeat a CT of the chest in August 2021.  We will schedule this at this visit.

## 2020-01-05 NOTE — Progress Notes (Signed)
[place] Cancer Follow up:    Denise Sites, MD [place] [id number]   DIAGNOSIS: Polycythemia  CURRENT THERAPY: Hydrea  INTERVAL HISTORY: Denise Maynard 63 y.o. female was called for a telephone visit for polycythemia vera.  Patient reports she is doing well since her last visit.  She is taking her Hydrea as prescribed.  She has no unwanted side effects.  She denies any leg ulcers.  She denies any aquagenic pruritus. Denies any nausea, vomiting, or diarrhea. Denies any new pains. Had not noticed any recent bleeding such as epistaxis, hematuria or hematochezia. Denies recent chest pain on exertion, shortness of breath on minimal exertion, pre-syncopal episodes, or palpitations. Denies any numbness or tingling in hands or feet. Denies any recent fevers, infections, or recent hospitalizations. Patient reports appetite at 100% and energy level at 100%.  She is eating well maintain her weight at this time.    Patient Active Problem List   Diagnosis Date Noted  . Myeloproliferative disorder ([place]) 12/25/2017  . Thrombocytosis ([place]) 12/04/2017  . Leukocytosis 12/04/2017  . Erythrocytosis 12/04/2017  . Serum calcium elevated 11/19/2017  . Serum potassium elevated 11/19/2017  . History of aneurysm 11/17/2017  . Elevated cholesterol 11/17/2017  . Screening for colorectal cancer 11/17/2017  . Encounter for gynecological examination with Papanicolaou smear of cervix 11/17/2017  . Special screening for malignant neoplasms, colon   . Nicotine addiction 05/15/2015  . Cerebral aneurysm 10/12/2014  . Pressure in head 07/03/2014    has No Known Allergies.  MEDICAL HISTORY: Past Medical History:  Diagnosis Date  . Carotid artery aneurysm (HCC)    Coil embolization of a supraclinoid right internal carotid artery - Dr. Kathyrn Sheriff  . Hyperlipidemia   . Nicotine addiction 05/15/2015   Will try patch  . Serum calcium elevated 11/19/2017   recheck  . Serum  potassium elevated 11/19/2017  . Stroke [facility])     SURGICAL HISTORY: Past Surgical History:  Procedure Laterality Date  . ABDOMINAL AORTAGRAM N/A 11/24/2014   Procedure: ABDOMINAL Maxcine Ham;  Surgeon: Elam Dutch, MD;  Location: [facility] CATH LAB;  Service: Cardiovascular;  Laterality: N/A;  . Aneursym repair  10/2014  . COLONOSCOPY N/A 07/30/2015   Procedure: COLONOSCOPY;  Surgeon: Danie Binder, MD;  [facility];  Service: Endoscopy;  Laterality: N/A;  1:00 Pm - moved to 1:30 - office to notify  . FRACTURE SURGERY     Jaw  . LOWER EXTREMITY ANGIOGRAM  11/24/2014   Procedure: LOWER EXTREMITY ANGIOGRAM;  Surgeon: Elam Dutch, MD;  Location: [facility] CATH LAB;  Service: Cardiovascular;;  . MOUTH SURGERY    . RADIOLOGY WITH ANESTHESIA N/A 10/12/2014   Procedure: Embolization/arteriogram;  Surgeon: Consuella Lose, MD;  Location: [place];  Service: Radiology;  Laterality: N/A;    SOCIAL HISTORY: Social History   Socioeconomic History  . Marital status: Divorced    Spouse name: Not on file  . Number of children: Not on file  . Years of education: Not on file  . Highest education level: Not on file  Occupational History  . Not on file  Tobacco Use  . Smoking status: Current Every Day Smoker    Packs/day: 0.50    Years: 30.00    Pack years: 15.00    Types: Cigarettes  . Smokeless tobacco: Never Used  Substance and Sexual Activity  . Alcohol use: No    Alcohol/week: 0.0 standard drinks  . Drug use: No  . Sexual activity:  Yes    Birth control/protection: Post-menopausal  Other Topics Concern  . Not on file  Social History Narrative  . Not on file   Social Determinants of Health   Financial Resource Strain:   . Difficulty of Paying Living Expenses:   Food Insecurity:   . Worried About Charity fundraiser in the Last Year:   . Arboriculturist in the Last Year:   Transportation Needs:   . Film/video editor (Medical):   Marland Kitchen Lack of Transportation (Non-Medical):    Physical Activity:   . Days of Exercise per Week:   . Minutes of Exercise per Session:   Stress:   . Feeling of Stress :   Social Connections:   . Frequency of Communication with Friends and Family:   . Frequency of Social Gatherings with Friends and Family:   . Attends Religious Services:   . Active Member of Clubs or Organizations:   . Attends Archivist Meetings:   Marland Kitchen Marital Status:   Intimate Partner Violence:   . Fear of Current or Ex-Partner:   . Emotionally Abused:   Marland Kitchen Physically Abused:   . Sexually Abused:     FAMILY HISTORY: Family History  Problem Relation Age of Onset  . Heart attack Mother   . Diabetes Mother   . Throat cancer Father   . Hypertension Father   . Hyperlipidemia Sister   . Diabetes Maternal Grandmother   . Cancer Maternal Grandfather   . Heart attack Paternal Grandfather   . Hyperlipidemia Sister     Review of Systems  All other systems reviewed and are negative.   Vital signs: -Deferred due to telephone visit  Physical Exam -Deferred due to telephone visit -Patient was alert and oriented over the phone and in no acute distress  LABORATORY DATA:  CBC    Component Value Date/Time   WBC 5.6 01/04/2020 0904   RBC 3.08 (L) 01/04/2020 0904   HGB 12.3 01/04/2020 0904   HGB 17.2 (H) 11/30/2017 0841   HCT 36.9 01/04/2020 0904   HCT 51.4 (H) 11/30/2017 0841   PLT 220 01/04/2020 0904   PLT 900 (HH) 11/30/2017 0841   MCV 119.8 (H) 01/04/2020 0904   MCV 95 11/30/2017 0841   MCH 39.9 (H) 01/04/2020 0904   MCHC 33.3 01/04/2020 0904   RDW 14.2 01/04/2020 0904   RDW 15.7 (H) 11/30/2017 0841   LYMPHSABS 1.2 01/04/2020 0904   LYMPHSABS 2.2 11/30/2017 0841   MONOABS 0.6 01/04/2020 0904   EOSABS 0.0 01/04/2020 0904   EOSABS 0.2 11/30/2017 0841   BASOSABS 0.0 01/04/2020 0904   BASOSABS 0.1 11/30/2017 0841    CMP     Component Value Date/Time   NA 138 01/04/2020 0904   NA 143 11/30/2017 0841   K 4.3 01/04/2020 0904   CL 103  01/04/2020 0904   CO2 26 01/04/2020 0904   GLUCOSE 103 (H) 01/04/2020 0904   BUN 9 01/04/2020 0904   BUN 7 (L) 11/30/2017 0841   CREATININE 0.67 01/04/2020 0904   CREATININE 0.46 (L) 07/03/2014 1030   CALCIUM 9.4 01/04/2020 0904   PROT 7.3 01/04/2020 0904   PROT 7.7 11/30/2017 0841   ALBUMIN 4.2 01/04/2020 0904   ALBUMIN 4.8 11/30/2017 0841   AST 20 01/04/2020 0904   ALT 15 01/04/2020 0904   ALKPHOS 69 01/04/2020 0904   BILITOT 0.8 01/04/2020 0904   BILITOT 0.3 11/30/2017 0841   GFRNONAA >60 01/04/2020 0904   GFRAA >60  01/04/2020 [id number]   All questions were answered to patient's stated satisfaction. Encouraged patient to call with any new concerns or questions before his next visit to the cancer center and we can certain see him sooner, if needed.     ASSESSMENT and THERAPY PLAN:   Myeloproliferative disorder ([place]) 1.  [name]: - She had a bone marrow aspiration and biopsy on 01/11/2018 shows hypercellular (50 to 70% cellularity) marrow with trilineage hematopoiesis.  Reticulin stain showed mild increase in reticulin fibers.  Chromosome analysis shows 69, XX. - No prior history of vasomotor symptoms or thrombosis.  Given her age more than 72, she was recommended to start on hydroxyurea. - She takes hydroxyurea 500 mg daily started on 12/26/2017, increased to 2 tablets daily on 02/07/2018. -On 07/22/2018, her platelet count dropped to 72.  Hence we will cut back the dose of Hydrea to 2 tablets on Monday Wednesdays and Fridays and 1 tablet on the other days. -Labs done on 01/04/2020 showed hemoglobin 12.3, hematocrit 36.9, platelets 220 -She will continue the same dose of hydroxyurea.  Our goal is to keep hematocrit below 45 and platelet count below 400. -She denies any vasomotor symptoms or aquagenic pruritus.  She denies any leg ulcers or nausea. -We will see her back in 3 months for follow-up and repeat labs.  2.  Sleeplessness: - She is taking Xanax 0.25 mg as needed  for sleep which is helping.  3.  Lung nodules: -Patient has a longtime smoking history. -CT of the chest on 03/25/2019 showed multiple small pulmonary nodules are again noted in the lungs bilaterally, largest of which is in the posterior aspect of the right upper lobe near the apex.  -We will repeat a CT of the chest in August 2021.  We will schedule this at this visit.   Orders Placed This Encounter  Procedures  . CT CHEST NODULE FOLLOW UP LOW DOSE WO CM    Standing Status:   Future    Standing Expiration Date:   01/04/2021    Order Specific Question:   ** REASON FOR EXAM (FREE TEXT)    Answer:   lung nodule follow up    Order Specific Question:   Preferred imaging location?    Answer:   [facility]    Order Specific Question:   Release to patient    Answer:   Immediate    Order Specific Question:   Radiology Contrast Protocol - do NOT remove file path    Answer:   [email or web address]  . Lactate dehydrogenase    Standing Status:   Future    Standing Expiration Date:   01/04/2021  . CBC with Differential/Platelet    Standing Status:   Future    Standing Expiration Date:   01/04/2021  . Comprehensive metabolic panel    Standing Status:   Future    Standing Expiration Date:   01/04/2021  . Vitamin B12    Standing Status:   Future    Standing Expiration Date:   01/04/2021  . VITAMIN D 25 Hydroxy (Vit-D Deficiency, Fractures)    Standing Status:   Future    Standing Expiration Date:   01/04/2021    All questions were answered. The patient knows to call the clinic with any problems, questions or concerns. We can certainly see the patient much sooner if necessary. This note was electronically signed.  I provided 29 minutes of non face-to-face telephone visit time during this encounter, and > 50% was  spent counseling as documented under my assessment & plan.   Glennie [place], NP-C 01/05/2020

## 2020-02-06 ENCOUNTER — Other Ambulatory Visit (HOSPITAL_COMMUNITY): Payer: Self-pay | Admitting: Adult Health

## 2020-02-06 DIAGNOSIS — Z1231 Encounter for screening mammogram for malignant neoplasm of breast: Secondary | ICD-10-CM

## 2020-02-08 ENCOUNTER — Other Ambulatory Visit: Payer: Self-pay

## 2020-02-08 ENCOUNTER — Ambulatory Visit (HOSPITAL_COMMUNITY)
Admission: RE | Admit: 2020-02-08 | Discharge: 2020-02-08 | Disposition: A | Discharge status: Home / Self Care | Payer: Medicare Other | Source: Ambulatory Visit | Attending: Adult Health | Admitting: Adult Health

## 2020-02-08 DIAGNOSIS — Z1231 Encounter for screening mammogram for malignant neoplasm of breast: Secondary | ICD-10-CM | POA: Diagnosis not present

## 2020-04-02 ENCOUNTER — Other Ambulatory Visit: Payer: Self-pay

## 2020-04-02 ENCOUNTER — Ambulatory Visit (HOSPITAL_COMMUNITY)
Admission: RE | Admit: 2020-04-02 | Discharge: 2020-04-02 | Disposition: A | Discharge status: Home / Self Care | Payer: Medicare Other | Source: Ambulatory Visit | Attending: Nurse Practitioner | Admitting: Nurse Practitioner

## 2020-04-02 ENCOUNTER — Inpatient Hospital Stay (HOSPITAL_COMMUNITY): Payer: Medicare Other | Attending: Hematology

## 2020-04-02 DIAGNOSIS — Z122 Encounter for screening for malignant neoplasm of respiratory organs: Secondary | ICD-10-CM | POA: Diagnosis not present

## 2020-04-02 DIAGNOSIS — D471 Chronic myeloproliferative disease: Secondary | ICD-10-CM

## 2020-04-02 DIAGNOSIS — F17210 Nicotine dependence, cigarettes, uncomplicated: Secondary | ICD-10-CM | POA: Insufficient documentation

## 2020-04-02 DIAGNOSIS — D45 Polycythemia vera: Secondary | ICD-10-CM | POA: Diagnosis not present

## 2020-04-02 LAB — CBC WITH DIFFERENTIAL/PLATELET
Abs Immature Granulocytes: 0.02 10*3/uL (ref 0.00–0.07)
Basophils Absolute: 0 10*3/uL (ref 0.0–0.1)
Basophils Relative: 0 %
Eosinophils Absolute: 0 10*3/uL (ref 0.0–0.5)
Eosinophils Relative: 0 %
HCT: 39.6 % (ref 36.0–46.0)
Hemoglobin: 12.9 g/dL (ref 12.0–15.0)
Immature Granulocytes: 0 %
Lymphocytes Relative: 23 %
Lymphs Abs: 1.4 10*3/uL (ref 0.7–4.0)
MCH: 38.5 pg — ABNORMAL HIGH (ref 26.0–34.0)
MCHC: 32.6 g/dL (ref 30.0–36.0)
MCV: 118.2 fL — ABNORMAL HIGH (ref 80.0–100.0)
Monocytes Absolute: 0.5 10*3/uL (ref 0.1–1.0)
Monocytes Relative: 8 %
Neutro Abs: 4.2 10*3/uL (ref 1.7–7.7)
Neutrophils Relative %: 69 %
Platelets: 250 10*3/uL (ref 150–400)
RBC: 3.35 MIL/uL — ABNORMAL LOW (ref 3.87–5.11)
RDW: 13.7 % (ref 11.5–15.5)
WBC: 6.2 10*3/uL (ref 4.0–10.5)
nRBC: 0 % (ref 0.0–0.2)

## 2020-04-02 LAB — COMPREHENSIVE METABOLIC PANEL
ALT: 17 U/L (ref 0–44)
AST: 20 U/L (ref 15–41)
Albumin: 4.4 g/dL (ref 3.5–5.0)
Alkaline Phosphatase: 79 U/L (ref 38–126)
Anion gap: 13 (ref 5–15)
BUN: 11 mg/dL (ref 8–23)
CO2: 25 mmol/L (ref 22–32)
Calcium: 9.7 mg/dL (ref 8.9–10.3)
Chloride: 102 mmol/L (ref 98–111)
Creatinine, Ser: 0.6 mg/dL (ref 0.44–1.00)
GFR calc Af Amer: >60 mL/min (ref >60)
GFR calc non Af Amer: >60 mL/min (ref >60)
Glucose, Bld: 93 mg/dL (ref 70–99)
Potassium: 4 mmol/L (ref 3.5–5.1)
Sodium: 140 mmol/L (ref 135–145)
Total Bilirubin: 0.4 mg/dL (ref 0.3–1.2)
Total Protein: 8.1 g/dL (ref 6.5–8.1)

## 2020-04-02 LAB — LACTATE DEHYDROGENASE: LDH: 183 U/L (ref 98–192)

## 2020-04-02 LAB — VITAMIN D 25 HYDROXY (VIT D DEFICIENCY, FRACTURES): Vit D, 25-Hydroxy: 35.25 ng/mL (ref 30–100)

## 2020-04-02 LAB — VITAMIN B12: Vitamin B-12: 723 pg/mL (ref 180–914)

## 2020-04-11 ENCOUNTER — Inpatient Hospital Stay (HOSPITAL_COMMUNITY): Payer: Medicare Other | Attending: Nurse Practitioner | Admitting: Nurse Practitioner

## 2020-04-11 ENCOUNTER — Other Ambulatory Visit: Payer: Self-pay

## 2020-04-11 DIAGNOSIS — D45 Polycythemia vera: Secondary | ICD-10-CM | POA: Insufficient documentation

## 2020-04-11 DIAGNOSIS — F17210 Nicotine dependence, cigarettes, uncomplicated: Secondary | ICD-10-CM | POA: Diagnosis not present

## 2020-04-11 DIAGNOSIS — R918 Other nonspecific abnormal finding of lung field: Secondary | ICD-10-CM | POA: Diagnosis not present

## 2020-04-11 DIAGNOSIS — D471 Chronic myeloproliferative disease: Secondary | ICD-10-CM | POA: Diagnosis not present

## 2020-04-11 DIAGNOSIS — Z79899 Other long term (current) drug therapy: Secondary | ICD-10-CM | POA: Diagnosis not present

## 2020-04-11 DIAGNOSIS — Z1589 Genetic susceptibility to other disease: Secondary | ICD-10-CM

## 2020-04-11 MED ORDER — HYDROXYUREA 500 MG PO CAPS: 1000.0000 mg | ORAL_CAPSULE | Freq: Every day | ORAL | 5 refills | Status: DC

## 2020-04-11 NOTE — Progress Notes (Signed)
[place], [place] [address]   CLINIC:  Medical Oncology/Hematology  PCP:  Sharilyn Sites, [place] [address] [phone]   REASON FOR VISIT: Follow-up for JAK2 positive polycythemia   CURRENT THERAPY: Hydrea   INTERVAL HISTORY:  [name] 64 y.o. female returns for routine follow-up for JAK2 positive polycythemia.  Patient reports she is doing well since her last visit.  She denies any leg ulcers.  She denies any aquagenic pruritus.  She denies any headaches or vision changes. Denies any nausea, vomiting, or diarrhea. Denies any new pains. Had not noticed any recent bleeding such as epistaxis, hematuria or hematochezia. Denies recent chest pain on exertion, shortness of breath on minimal exertion, pre-syncopal episodes, or palpitations. Denies any numbness or tingling in hands or feet. Denies any recent fevers, infections, or recent hospitalizations. Patient reports appetite at 100% and energy level at 100%.  She is eating well maintain her weight this time.     REVIEW OF SYSTEMS:  Review of Systems  All other systems reviewed and are negative.    PAST MEDICAL/SURGICAL HISTORY:  Past Medical History:  Diagnosis Date   Carotid artery aneurysm (HCC)    Coil embolization of a supraclinoid right internal carotid artery - Dr. Kathyrn Sheriff   Hyperlipidemia    Nicotine addiction 05/15/2015   Will try patch   Serum calcium elevated 11/19/2017   recheck   Serum potassium elevated 11/19/2017   Stroke [facility])    Past Surgical History:  Procedure Laterality Date   ABDOMINAL AORTAGRAM N/A 11/24/2014   Procedure: ABDOMINAL Maxcine Ham;  Surgeon: Elam Dutch, MD;  Location: [facility] CATH LAB;  Service: Cardiovascular;  Laterality: N/A;   Aneursym repair  10/2014   COLONOSCOPY N/A 07/30/2015   Procedure: COLONOSCOPY;  Surgeon: Danie Binder, MD;  [facility] ENDO SUITE;  Service: Endoscopy;  Laterality: N/A;  1:00 Pm  - moved to 1:30 - office to notify   FRACTURE SURGERY     Jaw   LOWER EXTREMITY ANGIOGRAM  11/24/2014   Procedure: LOWER EXTREMITY ANGIOGRAM;  Surgeon: Elam Dutch, MD;  Location: [facility] CATH LAB;  Service: Cardiovascular;;   MOUTH SURGERY     RADIOLOGY WITH ANESTHESIA N/A 10/12/2014   Procedure: Embolization/arteriogram;  Surgeon: Consuella Lose, MD;  Location: [facility];  Service: Radiology;  Laterality: N/A;     SOCIAL HISTORY:  Social History   Socioeconomic History   Marital status: Divorced    Spouse name: Not on file   Number of children: Not on file   Years of education: Not on file   Highest education level: Not on file  Occupational History   Not on file  Tobacco Use   Smoking status: Current Every Day Smoker    Packs/day: 0.50    Years: 30.00    Pack years: 15.00    Types: Cigarettes   Smokeless tobacco: Never Used  Substance and Sexual Activity   Alcohol use: No    Alcohol/week: 0.0 standard drinks   Drug use: No   Sexual activity: Yes    Birth control/protection: Post-menopausal  Other Topics Concern   Not on file  Social History Narrative   Not on file   Social Determinants of Health   Financial Resource Strain:    Difficulty of Paying Living Expenses: Not on file  Food Insecurity:    Worried About [place] in the Last Year: Not on file   [employer] of Food  in the Last Year: Not on file  Transportation Needs:    Lack of Transportation (Medical): Not on file   Lack of Transportation (Non-Medical): Not on file  Physical Activity:    Days of Exercise per Week: Not on file   Minutes of Exercise per Session: Not on file  Stress:    Feeling of Stress : Not on file  Social Connections:    Frequency of Communication with Friends and Family: Not on file   Frequency of Social Gatherings with Friends and Family: Not on file   Attends Religious Services: Not on file   Active Member of Clubs or Organizations: Not on file    Attends Archivist Meetings: Not on file   Marital Status: Not on file  Intimate Partner Violence:    Fear of Current or Ex-Partner: Not on file   Emotionally Abused: Not on file   Physically Abused: Not on file   Sexually Abused: Not on file    FAMILY HISTORY:  Family History  Problem Relation Age of Onset   Heart attack Mother    Diabetes Mother    Throat cancer Father    Hypertension Father    Hyperlipidemia Sister    Diabetes Maternal Grandmother    Cancer Maternal Grandfather    Heart attack Paternal Grandfather    Hyperlipidemia Sister     CURRENT MEDICATIONS:  Outpatient Encounter Medications as of 04/11/2020  Medication Sig   acetaminophen (TYLENOL) 500 MG tablet Take 1,000 mg by mouth every 6 (six) hours as needed for mild pain or moderate pain.    Artificial Tear Ointment (DRY EYES OP) Apply to eye daily.    aspirin EC 81 MG tablet Take 81 mg by mouth daily.   cholecalciferol (VITAMIN D) 1000 UNITS tablet Take 2,000 Units by mouth daily.    hydroxyurea (HYDREA) 500 MG capsule Take 2 capsules (1,000 mg total) by mouth daily. May take with food to minimize GI side effects.   Multiple Vitamins-Minerals (MULTIVITAMIN WITH MINERALS) tablet Take 1 tablet by mouth daily.   Omega-3 Fatty Acids (FISH OIL) 500 MG CAPS Take 500 mg by mouth once.    ALPRAZolam (XANAX) 0.25 MG tablet Take 1 tablet (0.25 mg total) by mouth at bedtime as needed for anxiety. (Patient not taking: Reported on 04/11/2020)   ALPRAZolam (XANAX) 0.5 MG tablet Take 0.5 mg by mouth at bedtime. (Patient not taking: Reported on 04/11/2020)   No facility-administered encounter medications on file as of 04/11/2020.    ALLERGIES:  No Known Allergies   PHYSICAL EXAM:  ECOG Performance status: 1  Vitals:   04/11/20 1119  BP: 131/60  Pulse: 62  Resp: 18  Temp: (!) 96.8 F (36 C)  SpO2: 100%   Filed Weights   04/11/20 1119  Weight: 140 lb 12.8 oz (63.9 kg)   Physical  Exam Constitutional:      Appearance: Normal appearance. She is normal weight.  Cardiovascular:     Rate and Rhythm: Normal rate and regular rhythm.     Heart sounds: Normal heart sounds.  Pulmonary:     Effort: Pulmonary effort is normal.     Breath sounds: Normal breath sounds.  Abdominal:     General: Bowel sounds are normal.     Palpations: Abdomen is soft.  Musculoskeletal:        General: Normal range of motion.  Skin:    General: Skin is warm.  Neurological:     Mental Status: She is alert and  oriented to person, place, and time. Mental status is at baseline.  Psychiatric:        Mood and Affect: Mood normal.        Behavior: Behavior normal.        Thought Content: Thought content normal.        Judgment: Judgment normal.      LABORATORY DATA:  I have reviewed the labs as listed.  CBC    Component Value Date/Time   WBC 6.2 04/02/2020 1009   RBC 3.35 (L) 04/02/2020 1009   HGB 12.9 04/02/2020 1009   HGB 17.2 (H) 11/30/2017 0841   HCT 39.6 04/02/2020 1009   HCT 51.4 (H) 11/30/2017 0841   PLT 250 04/02/2020 1009   PLT 900 (HH) 11/30/2017 0841   MCV 118.2 (H) 04/02/2020 1009   MCV 95 11/30/2017 0841   MCH 38.5 (H) 04/02/2020 1009   MCHC 32.6 04/02/2020 1009   RDW 13.7 04/02/2020 1009   RDW 15.7 (H) 11/30/2017 0841   LYMPHSABS 1.4 04/02/2020 1009   LYMPHSABS 2.2 11/30/2017 0841   MONOABS 0.5 04/02/2020 1009   EOSABS 0.0 04/02/2020 1009   EOSABS 0.2 11/30/2017 0841   BASOSABS 0.0 04/02/2020 1009   BASOSABS 0.1 11/30/2017 0841   CMP Latest Ref Rng & Units 04/02/2020 01/04/2020 09/27/2019  Glucose 70 - 99 mg/dL 93 103(H) 135(H)  BUN 8 - 23 mg/dL 11 9 12   Creatinine 0.44 - 1.00 mg/dL 0.60 0.67 0.64  Sodium 135 - 145 mmol/L 140 138 141  Potassium 3.5 - 5.1 mmol/L 4.0 4.3 4.5  Chloride 98 - 111 mmol/L 102 103 104  CO2 22 - 32 mmol/L 25 26 27   Calcium 8.9 - 10.3 mg/dL 9.7 9.4 9.8  Total Protein 6.5 - 8.1 g/dL 8.1 7.3 8.0  Total Bilirubin 0.3 - 1.2 mg/dL 0.4 0.8  0.6  Alkaline Phos 38 - 126 U/L 79 69 76  AST 15 - 41 U/L 20 20 21   ALT 0 - 44 U/L 17 15 17     All questions were answered to patient's stated satisfaction. Encouraged patient to call with any new concerns or questions before his next visit to the cancer center and we can certain see him sooner, if needed.     ASSESSMENT & PLAN:  Myeloproliferative disorder ([place]) 1.  [name]: - She had a bone marrow aspiration and biopsy on 01/11/2018 shows hypercellular (50 to 70% cellularity) marrow with trilineage hematopoiesis.  Reticulin stain showed mild increase in reticulin fibers.  Chromosome analysis shows 20, XX. - No prior history of vasomotor symptoms or thrombosis.  Given her age more than 61, she was recommended to start on hydroxyurea. - She takes hydroxyurea 500 mg daily started on 12/26/2017, increased to 2 tablets daily on 02/07/2018. -On 07/22/2018, her platelet count dropped to 72.  Hence we will cut back the dose of Hydrea to 2 tablets on Monday Wednesdays and Fridays and 1 tablet on the other days. -Labs done on 04/02/2020 shows hemoglobin 12.9, hematocrit 39.6, platelets 250 -She will continue the same dose of hydroxyurea.  Our goal is to keep hematocrit below 45 and platelet count below 400. -She denies any vasomotor symptoms or aquagenic pruritus.  She denies any leg ulcers or nausea. -We will see her back in 4 months for follow-up and repeat labs.  2.  Sleeplessness: - She is taking Xanax 0.25 mg as needed for sleep which is helping.  3.  Lung nodules: -Patient has a longtime smoking history. -  CT of the chest on 03/25/2019 showed multiple small pulmonary nodules are again noted in the lungs bilaterally, largest of which is in the posterior aspect of the right upper lobe near the apex.  -CT of the chest on 04/02/2020 showed lung RADS 2 follow-up in 1 year     Orders placed this encounter:  Orders Placed This Encounter  Procedures   Lactate dehydrogenase   CBC  with Differential/Platelet   Comprehensive metabolic panel   Vitamin S04   VITAMIN D 25 Hydroxy (Vit-D Deficiency, Fractures)      Francene Finders, FNP-C [place] [phone]

## 2020-04-11 NOTE — Assessment & Plan Note (Signed)
1.  [name]: - She had a bone marrow aspiration and biopsy on 01/11/2018 shows hypercellular (50 to 70% cellularity) marrow with trilineage hematopoiesis.  Reticulin stain showed mild increase in reticulin fibers.  Chromosome analysis shows 51, XX. - No prior history of vasomotor symptoms or thrombosis.  Given her age more than 89, she was recommended to start on hydroxyurea. - She takes hydroxyurea 500 mg daily started on 12/26/2017, increased to 2 tablets daily on 02/07/2018. -On 07/22/2018, her platelet count dropped to 72.  Hence we will cut back the dose of Hydrea to 2 tablets on Monday Wednesdays and Fridays and 1 tablet on the other days. -Labs done on 04/02/2020 shows hemoglobin 12.9, hematocrit 39.6, platelets 250 -She will continue the same dose of hydroxyurea.  Our goal is to keep hematocrit below 45 and platelet count below 400. -She denies any vasomotor symptoms or aquagenic pruritus.  She denies any leg ulcers or nausea. -We will see her back in 4 months for follow-up and repeat labs.  2.  Sleeplessness: - She is taking Xanax 0.25 mg as needed for sleep which is helping.  3.  Lung nodules: -Patient has a longtime smoking history. -CT of the chest on 03/25/2019 showed multiple small pulmonary nodules are again noted in the lungs bilaterally, largest of which is in the posterior aspect of the right upper lobe near the apex.  -CT of the chest on 04/02/2020 showed lung RADS 2 follow-up in 1 year

## 2020-07-30 ENCOUNTER — Encounter: Payer: Self-pay | Admitting: Internal Medicine

## 2020-08-22 ENCOUNTER — Encounter (HOSPITAL_COMMUNITY): Payer: Self-pay | Admitting: *Deleted

## 2020-08-22 NOTE — Progress Notes (Signed)
Patient called clinic asking if her hydrea could be causing mouth ulcers that were recently identified by her dentist.    Per Dr. Delton Coombes, it is unlikely the hydrea causing them because she has been on the hydrea for an extended period of time.  Patient was advised to try warm salt water gargles, keeping good dental/oral hygiene and using over the counter medication to help heal the ulcers.  She was advised to call the clinic should she have any further questions or concerns.

## 2020-08-23 DIAGNOSIS — L438 Other lichen planus: Secondary | ICD-10-CM | POA: Diagnosis not present

## 2020-08-24 ENCOUNTER — Other Ambulatory Visit (HOSPITAL_COMMUNITY): Payer: Self-pay

## 2020-08-24 DIAGNOSIS — D471 Chronic myeloproliferative disease: Secondary | ICD-10-CM

## 2020-08-24 DIAGNOSIS — D75839 Thrombocytosis, unspecified: Secondary | ICD-10-CM

## 2020-08-24 DIAGNOSIS — Z1589 Genetic susceptibility to other disease: Secondary | ICD-10-CM

## 2020-08-28 ENCOUNTER — Other Ambulatory Visit: Payer: Self-pay

## 2020-08-28 ENCOUNTER — Inpatient Hospital Stay (HOSPITAL_COMMUNITY): Payer: Medicare Other | Attending: Hematology

## 2020-08-28 DIAGNOSIS — R918 Other nonspecific abnormal finding of lung field: Secondary | ICD-10-CM | POA: Diagnosis not present

## 2020-08-28 DIAGNOSIS — D45 Polycythemia vera: Secondary | ICD-10-CM | POA: Diagnosis not present

## 2020-08-28 DIAGNOSIS — D471 Chronic myeloproliferative disease: Secondary | ICD-10-CM

## 2020-08-28 DIAGNOSIS — D75839 Thrombocytosis, unspecified: Secondary | ICD-10-CM

## 2020-08-28 DIAGNOSIS — Z1589 Genetic susceptibility to other disease: Secondary | ICD-10-CM

## 2020-08-28 LAB — COMPREHENSIVE METABOLIC PANEL
ALT: 16 U/L (ref 0–44)
AST: 24 U/L (ref 15–41)
Albumin: 4.7 g/dL (ref 3.5–5.0)
Alkaline Phosphatase: 75 U/L (ref 38–126)
Anion gap: 13 (ref 5–15)
BUN: 10 mg/dL (ref 8–23)
CO2: 23 mmol/L (ref 22–32)
Calcium: 10 mg/dL (ref 8.9–10.3)
Chloride: 102 mmol/L (ref 98–111)
Creatinine, Ser: 0.7 mg/dL (ref 0.44–1.00)
GFR, Estimated: >60 mL/min (ref >60)
Glucose, Bld: 100 mg/dL — ABNORMAL HIGH (ref 70–99)
Potassium: 4.2 mmol/L (ref 3.5–5.1)
Sodium: 138 mmol/L (ref 135–145)
Total Bilirubin: 0.5 mg/dL (ref 0.3–1.2)
Total Protein: 8.3 g/dL — ABNORMAL HIGH (ref 6.5–8.1)

## 2020-08-28 LAB — CBC WITH DIFFERENTIAL/PLATELET
Abs Immature Granulocytes: 0.02 10*3/uL (ref 0.00–0.07)
Basophils Absolute: 0 10*3/uL (ref 0.0–0.1)
Basophils Relative: 0 %
Eosinophils Absolute: 0 10*3/uL (ref 0.0–0.5)
Eosinophils Relative: 0 %
HCT: 41.2 % (ref 36.0–46.0)
Hemoglobin: 13.8 g/dL (ref 12.0–15.0)
Immature Granulocytes: 0 %
Lymphocytes Relative: 27 %
Lymphs Abs: 1.8 10*3/uL (ref 0.7–4.0)
MCH: 40 pg — ABNORMAL HIGH (ref 26.0–34.0)
MCHC: 33.5 g/dL (ref 30.0–36.0)
MCV: 119.4 fL — ABNORMAL HIGH (ref 80.0–100.0)
Monocytes Absolute: 0.4 10*3/uL (ref 0.1–1.0)
Monocytes Relative: 7 %
Neutro Abs: 4.2 10*3/uL (ref 1.7–7.7)
Neutrophils Relative %: 66 %
Platelets: 259 10*3/uL (ref 150–400)
RBC: 3.45 MIL/uL — ABNORMAL LOW (ref 3.87–5.11)
RDW: 13.3 % (ref 11.5–15.5)
WBC: 6.5 10*3/uL (ref 4.0–10.5)
nRBC: 0 % (ref 0.0–0.2)

## 2020-08-28 LAB — VITAMIN D 25 HYDROXY (VIT D DEFICIENCY, FRACTURES): Vit D, 25-Hydroxy: 63.49 ng/mL (ref 30–100)

## 2020-08-28 LAB — LACTATE DEHYDROGENASE: LDH: 201 U/L — ABNORMAL HIGH (ref 98–192)

## 2020-08-28 LAB — VITAMIN B12: Vitamin B-12: 644 pg/mL (ref 180–914)

## 2020-09-04 ENCOUNTER — Other Ambulatory Visit: Payer: Self-pay

## 2020-09-04 ENCOUNTER — Inpatient Hospital Stay (HOSPITAL_COMMUNITY): Payer: Medicare Other | Admitting: Oncology

## 2020-09-04 VITALS — BP 129/76 | HR 67 | Temp 97.4°F | Resp 17

## 2020-09-04 DIAGNOSIS — R918 Other nonspecific abnormal finding of lung field: Secondary | ICD-10-CM | POA: Diagnosis not present

## 2020-09-04 DIAGNOSIS — Z1589 Genetic susceptibility to other disease: Secondary | ICD-10-CM | POA: Diagnosis not present

## 2020-09-04 DIAGNOSIS — D45 Polycythemia vera: Secondary | ICD-10-CM | POA: Diagnosis not present

## 2020-09-04 DIAGNOSIS — D471 Chronic myeloproliferative disease: Secondary | ICD-10-CM

## 2020-09-04 NOTE — Progress Notes (Signed)
[facility] [address] [place], [place] [id number]   CLINIC:  Medical Oncology/Hematology  PCP:  Assunta FoundGolding, John, MD [address] [place] [id number] [phone]   REASON FOR VISIT: Follow-up for JAK2 positive polycythemia   CURRENT THERAPY: Hydrea   INTERVAL HISTORY:  Ms. Frazier ButtBenfield 65 y.o. female returns for routine follow-up for JAK2 positive polycythemia.  She was last seen in clinic on 04/11/2020.  In the interim she has done well.  She reports she was seen by her dentist and found to have several white plaques on her tongue and on her gums.  She was referred to an oral surgeon who started her on fluocinonide 0.05%.  She has not noticed any improvement.  She has follow-up with him in early February 2022.    She denies any leg ulcers.  She denies any aquagenic pruritus.  She denies any headaches or vision changes. Denies any nausea, vomiting, or diarrhea. Denies any new pains. Had not noticed any recent bleeding such as epistaxis, hematuria or hematochezia. Denies recent chest pain on exertion, shortness of breath on minimal exertion, pre-syncopal episodes, or palpitations. Denies any numbness or tingling in hands or feet. Denies any recent fevers, infections, or recent hospitalizations. Patient reports appetite at 100% and energy level at 100%.  She is eating well maintain her weight this time.   REVIEW OF SYSTEMS:  Review of Systems  Constitutional: Negative for appetite change, fatigue, fever and unexpected weight change.  HENT:   Positive for mouth sores (White plaques on tongue and gums). Negative for nosebleeds, sore throat and trouble swallowing.   Eyes: Negative.   Respiratory: Negative.  Negative for cough, shortness of breath and wheezing.   Cardiovascular: Negative.  Negative for chest pain and leg swelling.  Gastrointestinal: Negative for abdominal pain, blood in stool, constipation, diarrhea, nausea and vomiting.  Endocrine: Negative.    Genitourinary: Negative.  Negative for bladder incontinence, hematuria and nocturia.   Musculoskeletal: Negative.  Negative for back pain and flank pain.  Skin: Negative.   Neurological: Negative.  Negative for dizziness, headaches, light-headedness and numbness.  Hematological: Negative.   Psychiatric/Behavioral: Negative.  Negative for confusion. The patient is not nervous/anxious.   All other systems reviewed and are negative.    PAST MEDICAL/SURGICAL HISTORY:  Past Medical History:  Diagnosis Date  . Carotid artery aneurysm (HCC)    Coil embolization of a supraclinoid right internal carotid artery - Dr. Conchita ParisNundkumar  . Hyperlipidemia   . Nicotine addiction 05/15/2015   Will try patch  . Serum calcium elevated 11/19/2017   recheck  . Serum potassium elevated 11/19/2017  . Stroke [facility](HCC)    Past Surgical History:  Procedure Laterality Date  . ABDOMINAL AORTAGRAM N/A 11/24/2014   Procedure: ABDOMINAL Ronny FlurryAORTAGRAM;  Surgeon: Sherren Kernsharles E Fields, MD;  [facility];  Service: Cardiovascular;  Laterality: N/A;  . Aneursym repair  10/2014  . COLONOSCOPY N/A 07/30/2015   Procedure: COLONOSCOPY;  Surgeon: West BaliSandi L Fields, MD;  [facility];  Service: Endoscopy;  Laterality: N/A;  1:00 Pm - moved to 1:30 - office to notify  . FRACTURE SURGERY     Jaw  . LOWER EXTREMITY ANGIOGRAM  11/24/2014   Procedure: LOWER EXTREMITY ANGIOGRAM;  Surgeon: Sherren Kernsharles E Fields, MD;  [facility];  Service: Cardiovascular;;  . MOUTH SURGERY    . RADIOLOGY WITH ANESTHESIA N/A 10/12/2014   Procedure: Embolization/arteriogram;  Surgeon: Lisbeth RenshawNeelesh Nundkumar, MD;  [facility];  Service: Radiology;  Laterality: N/A;     SOCIAL HISTORY:  Social History   Socioeconomic History  . Marital status: Divorced    Spouse name: Not on file  . Number of children: Not on file  . Years of education: Not on file  . Highest education level: Not on file  Occupational History  . Not on file  Tobacco Use   . Smoking status: Current Every Day Smoker    Packs/day: 0.50    Years: 30.00    Pack years: 15.00    Types: Cigarettes  . Smokeless tobacco: Never Used  Substance and Sexual Activity  . Alcohol use: No    Alcohol/week: 0.0 standard drinks  . Drug use: No  . Sexual activity: Yes    Birth control/protection: Post-menopausal  Other Topics Concern  . Not on file  Social History Narrative  . Not on file   Social Determinants of Health   Financial Resource Strain: Not on file  Food Insecurity: Not on file  Transportation Needs: Not on file  Physical Activity: Not on file  Stress: Not on file  Social Connections: Not on file  Intimate Partner Violence: Not on file    FAMILY HISTORY:  Family History  Problem Relation Age of Onset  . Heart attack Mother   . Diabetes Mother   . Throat cancer Father   . Hypertension Father   . Hyperlipidemia Sister   . Diabetes Maternal Grandmother   . Cancer Maternal Grandfather   . Heart attack Paternal Grandfather   . Hyperlipidemia Sister     CURRENT MEDICATIONS:  Outpatient Encounter Medications as of 09/04/2020  Medication Sig  . Artificial Tear Ointment (DRY EYES OP) Apply to eye daily.   Marland Kitchen aspirin EC 81 MG tablet Take 81 mg by mouth daily.  . cholecalciferol (VITAMIN D) 1000 UNITS tablet Take 4,000 Units by mouth daily.  . fluocinonide gel (LIDEX) 0.05 % Apply topically.  . hydroxyurea (HYDREA) 500 MG capsule Take 2 capsules (1,000 mg total) by mouth daily. Take 2 capsules Monday Wednesday Friday and take 1 every other day of the week.may take with food to minimize GI side effects.  . Multiple Vitamins-Minerals (MULTIVITAMIN WITH MINERALS) tablet Take 1 tablet by mouth daily.  . Omega-3 Fatty Acids (FISH OIL) 500 MG CAPS Take 500 mg by mouth once.   Marland Kitchen acetaminophen (TYLENOL) 500 MG tablet Take 1,000 mg by mouth every 6 (six) hours as needed for mild pain or moderate pain.  (Patient not taking: Reported on 09/04/2020)  . ALPRAZolam  (XANAX) 0.5 MG tablet Take 0.5 mg by mouth at bedtime. (Patient not taking: Reported on 09/04/2020)  . [DISCONTINUED] ALPRAZolam (XANAX) 0.25 MG tablet Take 1 tablet (0.25 mg total) by mouth at bedtime as needed for anxiety.   No facility-administered encounter medications on file as of 09/04/2020.    ALLERGIES:  No Known Allergies   PHYSICAL EXAM:  ECOG Performance status: 1  Vitals:   09/04/20 1400  BP: 129/76  Pulse: 67  Resp: 17  Temp: (!) 97.4 F (36.3 C)  SpO2: 98%   There were no vitals filed for this visit. Physical Exam Constitutional:      Appearance: Normal appearance. She is normal weight.  Cardiovascular:     Rate and Rhythm: Normal rate and regular rhythm.     Heart sounds: Normal heart sounds.  Pulmonary:     Effort: Pulmonary effort is normal.     Breath sounds: Normal breath sounds.  Abdominal:  General: Bowel sounds are normal.     Palpations: Abdomen is soft.  Musculoskeletal:        General: Normal range of motion.  Skin:    General: Skin is warm.  Neurological:     Mental Status: She is alert and oriented to person, place, and time. Mental status is at baseline.  Psychiatric:        Mood and Affect: Mood normal.        Behavior: Behavior normal.        Thought Content: Thought content normal.        Judgment: Judgment normal.      LABORATORY DATA:  I have reviewed the labs as listed.  CBC    Component Value Date/Time   WBC 6.5 08/28/2020 1053   RBC 3.45 (L) 08/28/2020 1053   HGB 13.8 08/28/2020 1053   HGB 17.2 (H) 11/30/2017 0841   HCT 41.2 08/28/2020 1053   HCT 51.4 (H) 11/30/2017 0841   PLT 259 08/28/2020 1053   PLT 900 (HH) 11/30/2017 0841   MCV 119.4 (H) 08/28/2020 1053   MCV 95 11/30/2017 0841   MCH 40.0 (H) 08/28/2020 1053   MCHC 33.5 08/28/2020 1053   RDW 13.3 08/28/2020 1053   RDW 15.7 (H) 11/30/2017 0841   LYMPHSABS 1.8 08/28/2020 1053   LYMPHSABS 2.2 11/30/2017 0841   MONOABS 0.4 08/28/2020 1053   EOSABS 0.0  08/28/2020 1053   EOSABS 0.2 11/30/2017 0841   BASOSABS 0.0 08/28/2020 1053   BASOSABS 0.1 11/30/2017 0841   CMP Latest Ref Rng & Units 08/28/2020 04/02/2020 01/04/2020  Glucose 70 - 99 mg/dL 100(H) 93 103(H)  BUN 8 - 23 mg/dL 10 11 9   Creatinine 0.44 - 1.00 mg/dL 0.70 0.60 0.67  Sodium 135 - 145 mmol/L 138 140 138  Potassium 3.5 - 5.1 mmol/L 4.2 4.0 4.3  Chloride 98 - 111 mmol/L 102 102 103  CO2 22 - 32 mmol/L 23 25 26   Calcium 8.9 - 10.3 mg/dL 10.0 9.7 9.4  Total Protein 6.5 - 8.1 g/dL 8.3(H) 8.1 7.3  Total Bilirubin 0.3 - 1.2 mg/dL 0.5 0.4 0.8  Alkaline Phos 38 - 126 U/L 75 79 69  AST 15 - 41 U/L 24 20 20   ALT 0 - 44 U/L 16 17 15     All questions were answered to patient's stated satisfaction. Encouraged patient to call with any new concerns or questions before his next visit to the cancer center and we can certain see him sooner, if needed.     ASSESSMENT & PLAN:  1.  [name]: - She had a bone marrow aspiration and biopsy on 01/11/2018 shows hypercellular (50 to 70% cellularity) marrow with trilineage hematopoiesis.  Reticulin stain showed mild increase in reticulin fibers.  Chromosome analysis shows 10, XX. - No prior history of vasomotor symptoms or thrombosis.  Given her age more than 13, she was recommended to start on hydroxyurea. - She takes hydroxyurea 500 mg daily started on 12/26/2017, increased to 2 tablets daily on 02/07/2018. -On 07/22/2018, her platelet count dropped to 72.  Hence we will cut back the dose of Hydrea to 2 tablets on Monday Wednesdays and Fridays and 1 tablet on the other days. -Labs done on  08/28/2020 show hemoglobin of 13.8, hematocrit 41.2 and platelet count of 259. -She will continue the same dose of hydroxyurea.  Our goal is to keep hematocrit below 45 and platelet count below 400. -She denies any vasomotor symptoms or aquagenic pruritus.  She denies any leg ulcers or nausea. -We will see her back in 4 months for follow-up and repeat  labs.  2.  Sleeplessness: - She is taking Xanax 0.25 mg as needed for sleep which is helping.  3.  Lung nodules: -Patient has a longtime smoking history. -CT of the chest on 03/25/2019 showed multiple small pulmonary nodules are again noted in the lungs bilaterally, largest of which is in the posterior aspect of the right upper lobe near the apex.  -CT of the chest on 04/02/2020 showed lung RADS 2 follow-up in 1 year -Repeat in August 2022.  Disposition: -RTC in 4 months for repeat lab work (CBC, LDH, vitamin D, vitamin B12, CMP) and MD assessment. -Repeat LDCT scan in August 2022-we will get that scheduled at next visit.  No problem-specific Assessment & Plan notes found for this encounter.   Orders placed this encounter:  No orders of the defined types were placed in this encounter.  Faythe Casa, NP 09/04/2020 2:41 PM  [place] [phone]

## 2020-12-12 DIAGNOSIS — Z0001 Encounter for general adult medical examination with abnormal findings: Secondary | ICD-10-CM | POA: Diagnosis not present

## 2020-12-12 DIAGNOSIS — D45 Polycythemia vera: Secondary | ICD-10-CM | POA: Diagnosis not present

## 2020-12-12 DIAGNOSIS — M5030 Other cervical disc degeneration, unspecified cervical region: Secondary | ICD-10-CM | POA: Diagnosis not present

## 2020-12-12 DIAGNOSIS — I671 Cerebral aneurysm, nonruptured: Secondary | ICD-10-CM | POA: Diagnosis not present

## 2020-12-12 DIAGNOSIS — Z23 Encounter for immunization: Secondary | ICD-10-CM | POA: Diagnosis not present

## 2020-12-24 DIAGNOSIS — B070 Plantar wart: Secondary | ICD-10-CM | POA: Diagnosis not present

## 2020-12-24 DIAGNOSIS — X32XXXA Exposure to sunlight, initial encounter: Secondary | ICD-10-CM | POA: Diagnosis not present

## 2020-12-24 DIAGNOSIS — L570 Actinic keratosis: Secondary | ICD-10-CM | POA: Diagnosis not present

## 2020-12-31 ENCOUNTER — Other Ambulatory Visit (HOSPITAL_COMMUNITY): Payer: Self-pay | Admitting: *Deleted

## 2020-12-31 DIAGNOSIS — Z1589 Genetic susceptibility to other disease: Secondary | ICD-10-CM

## 2020-12-31 DIAGNOSIS — D471 Chronic myeloproliferative disease: Secondary | ICD-10-CM

## 2021-01-01 ENCOUNTER — Other Ambulatory Visit: Payer: Self-pay

## 2021-01-01 ENCOUNTER — Inpatient Hospital Stay (HOSPITAL_COMMUNITY): Payer: Medicare Other | Attending: Hematology

## 2021-01-01 DIAGNOSIS — D471 Chronic myeloproliferative disease: Secondary | ICD-10-CM

## 2021-01-01 DIAGNOSIS — E785 Hyperlipidemia, unspecified: Secondary | ICD-10-CM | POA: Diagnosis not present

## 2021-01-01 DIAGNOSIS — Z1589 Genetic susceptibility to other disease: Secondary | ICD-10-CM

## 2021-01-01 DIAGNOSIS — D45 Polycythemia vera: Secondary | ICD-10-CM | POA: Diagnosis not present

## 2021-01-01 DIAGNOSIS — G4700 Insomnia, unspecified: Secondary | ICD-10-CM | POA: Insufficient documentation

## 2021-01-01 DIAGNOSIS — R918 Other nonspecific abnormal finding of lung field: Secondary | ICD-10-CM | POA: Diagnosis not present

## 2021-01-01 LAB — CBC WITH DIFFERENTIAL/PLATELET
Abs Immature Granulocytes: 0.01 10*3/uL (ref 0.00–0.07)
Basophils Absolute: 0 10*3/uL (ref 0.0–0.1)
Basophils Relative: 0 %
Eosinophils Absolute: 0.1 10*3/uL (ref 0.0–0.5)
Eosinophils Relative: 1 %
HCT: 37.8 % (ref 36.0–46.0)
Hemoglobin: 12.7 g/dL (ref 12.0–15.0)
Immature Granulocytes: 0 %
Lymphocytes Relative: 35 %
Lymphs Abs: 2.1 10*3/uL (ref 0.7–4.0)
MCH: 40.3 pg — ABNORMAL HIGH (ref 26.0–34.0)
MCHC: 33.6 g/dL (ref 30.0–36.0)
MCV: 120 fL — ABNORMAL HIGH (ref 80.0–100.0)
Monocytes Absolute: 0.6 10*3/uL (ref 0.1–1.0)
Monocytes Relative: 10 %
Neutro Abs: 3.2 10*3/uL (ref 1.7–7.7)
Neutrophils Relative %: 54 %
Platelets: 236 10*3/uL (ref 150–400)
RBC: 3.15 MIL/uL — ABNORMAL LOW (ref 3.87–5.11)
RDW: 13.5 % (ref 11.5–15.5)
WBC: 6 10*3/uL (ref 4.0–10.5)
nRBC: 0 % (ref 0.0–0.2)

## 2021-01-01 LAB — VITAMIN D 25 HYDROXY (VIT D DEFICIENCY, FRACTURES): Vit D, 25-Hydroxy: 47.72 ng/mL (ref 30–100)

## 2021-01-02 ENCOUNTER — Other Ambulatory Visit (HOSPITAL_COMMUNITY): Payer: Self-pay

## 2021-01-02 DIAGNOSIS — D471 Chronic myeloproliferative disease: Secondary | ICD-10-CM

## 2021-01-03 ENCOUNTER — Other Ambulatory Visit: Payer: Self-pay

## 2021-01-03 ENCOUNTER — Inpatient Hospital Stay (HOSPITAL_COMMUNITY): Payer: Medicare Other

## 2021-01-03 DIAGNOSIS — E785 Hyperlipidemia, unspecified: Secondary | ICD-10-CM | POA: Diagnosis not present

## 2021-01-03 DIAGNOSIS — D45 Polycythemia vera: Secondary | ICD-10-CM | POA: Diagnosis not present

## 2021-01-03 DIAGNOSIS — R918 Other nonspecific abnormal finding of lung field: Secondary | ICD-10-CM | POA: Diagnosis not present

## 2021-01-03 DIAGNOSIS — Z1589 Genetic susceptibility to other disease: Secondary | ICD-10-CM

## 2021-01-03 DIAGNOSIS — G4700 Insomnia, unspecified: Secondary | ICD-10-CM | POA: Diagnosis not present

## 2021-01-03 DIAGNOSIS — D471 Chronic myeloproliferative disease: Secondary | ICD-10-CM

## 2021-01-03 LAB — COMPREHENSIVE METABOLIC PANEL
ALT: 15 U/L (ref 0–44)
AST: 19 U/L (ref 15–41)
Albumin: 4.2 g/dL (ref 3.5–5.0)
Alkaline Phosphatase: 75 U/L (ref 38–126)
Anion gap: 6 (ref 5–15)
BUN: 11 mg/dL (ref 8–23)
CO2: 27 mmol/L (ref 22–32)
Calcium: 9.4 mg/dL (ref 8.9–10.3)
Chloride: 105 mmol/L (ref 98–111)
Creatinine, Ser: 0.6 mg/dL (ref 0.44–1.00)
GFR, Estimated: >60 mL/min (ref >60)
Glucose, Bld: 95 mg/dL (ref 70–99)
Potassium: 4.9 mmol/L (ref 3.5–5.1)
Sodium: 138 mmol/L (ref 135–145)
Total Bilirubin: 0.7 mg/dL (ref 0.3–1.2)
Total Protein: 7.6 g/dL (ref 6.5–8.1)

## 2021-01-03 LAB — LIPID PANEL
Cholesterol: 241 mg/dL — ABNORMAL HIGH (ref 0–200)
HDL: 71 mg/dL (ref >40)
LDL Cholesterol: 139 mg/dL — ABNORMAL HIGH (ref 0–99)
Total CHOL/HDL Ratio: 3.4 RATIO
Triglycerides: 157 mg/dL — ABNORMAL HIGH (ref <150)
VLDL: 31 mg/dL (ref 0–40)

## 2021-01-03 LAB — VITAMIN B12: Vitamin B-12: 635 pg/mL (ref 180–914)

## 2021-01-03 LAB — LACTATE DEHYDROGENASE: LDH: 165 U/L (ref 98–192)

## 2021-01-05 NOTE — Progress Notes (Signed)
Denise Maynard, [place] [address]   CLINIC:  Medical Oncology/Hematology  PCP:  Denise Maynard, [place] / [place] [address]  [phone]  REASON FOR VISIT:  Follow-up for JAK2 positive polycythemia  PRIOR THERAPY: none  CURRENT THERAPY: Hydrea  INTERVAL HISTORY:  Denise Maynard, a 65 y.o. female, returns for routine follow-up for her JAK2 positive polycythemia. Denise Maynard was last seen on 09/04/2020.  Today she reports feeling well. She denies any itching after showers, changes in her fingertips, fevers, or infections. She is taking Hydrea: 2 tablets Mon, Wed, and Fri, and 1 tablets all other days; she is tolerating it well. She is taking Xanax prn to aid sleep. She takes 2000 units of Vitamin D daily. She takes asprin 1x daily. Her appetite is good. She currently has a cough but attributes it to allergies and denies CP or blood in sputum.   REVIEW OF SYSTEMS:  Review of Systems  Constitutional: Positive for appetite change (75%) and fatigue (75%). Negative for fever.  Respiratory: Cough: d/t allergies.   Cardiovascular: Negative for chest pain.  Skin: Negative for itching.  All other systems reviewed and are negative.   PAST MEDICAL/SURGICAL HISTORY:  Past Medical History:  Diagnosis Date  . Carotid artery aneurysm (HCC)    Coil embolization of a supraclinoid right internal carotid artery - Dr. Kathyrn Maynard  . Hyperlipidemia   . Nicotine addiction 05/15/2015   Will try patch  . Serum calcium elevated 11/19/2017   recheck  . Serum potassium elevated 11/19/2017  . Stroke [facility])    Past Surgical History:  Procedure Laterality Date  . ABDOMINAL AORTAGRAM N/A 11/24/2014   Procedure: ABDOMINAL Denise Maynard;  Surgeon: Denise Dutch, MD;  Location: [facility] CATH LAB;  Service: Cardiovascular;  Laterality: N/A;  . Aneursym repair  10/2014  . COLONOSCOPY N/A 07/30/2015   Procedure: COLONOSCOPY;  Surgeon: Denise Binder, MD;  Location: [facility]  ENDO SUITE;  Service: Endoscopy;  Laterality: N/A;  1:00 Pm - moved to 1:30 - office to notify  . FRACTURE SURGERY     Jaw  . LOWER EXTREMITY ANGIOGRAM  11/24/2014   Procedure: LOWER EXTREMITY ANGIOGRAM;  Surgeon: Denise Dutch, MD;  Location: [facility] CATH LAB;  Service: Cardiovascular;;  . MOUTH SURGERY    . RADIOLOGY WITH ANESTHESIA N/A 10/12/2014   Procedure: Embolization/arteriogram;  Surgeon: Consuella Lose, MD;  Location: [facility];  Service: Radiology;  Laterality: N/A;    SOCIAL HISTORY:  Social History   Socioeconomic History  . Marital status: Divorced    Spouse name: Not on file  . Number of children: Not on file  . Years of education: Not on file  . Highest education level: Not on file  Occupational History  . Not on file  Tobacco Use  . Smoking status: Current Every Day Smoker    Packs/day: 0.50    Years: 30.00    Pack years: 15.00    Types: Cigarettes  . Smokeless tobacco: Never Used  Substance and Sexual Activity  . Alcohol use: No    Alcohol/week: 0.0 standard drinks  . Drug use: No  . Sexual activity: Yes    Birth control/protection: Post-menopausal  Other Topics Concern  . Not on file  Social History Narrative  . Not on file   Social Determinants of Health   Financial Resource Strain: Not on file  Food Insecurity: Not on file  Transportation Needs: Not on file  Physical Activity: Not on  file  Stress: Not on file  Social Connections: Not on file  Intimate Partner Violence: Not on file    FAMILY HISTORY:  Family History  Problem Relation Age of Onset  . Heart attack Mother   . Diabetes Mother   . Throat cancer Father   . Hypertension Father   . Hyperlipidemia Sister   . Diabetes Maternal Grandmother   . Cancer Maternal Grandfather   . Heart attack Paternal Grandfather   . Hyperlipidemia Sister     CURRENT MEDICATIONS:  Current Outpatient Medications  Medication Sig Dispense Refill  . acetaminophen (TYLENOL) 500 MG tablet Take 1,000 mg by  mouth every 6 (six) hours as needed for mild pain or moderate pain.  (Patient not taking: Reported on 09/04/2020)    . ALPRAZolam (XANAX) 0.5 MG tablet Take 0.5 mg by mouth at bedtime. (Patient not taking: Reported on 09/04/2020)    . Artificial Tear Ointment (DRY EYES OP) Apply to eye daily.     Marland Kitchen aspirin EC 81 MG tablet Take 81 mg by mouth daily.    . cholecalciferol (VITAMIN D) 1000 UNITS tablet Take 4,000 Units by mouth daily.    . fluocinonide gel (LIDEX) 0.05 % Apply topically.    . hydroxyurea (HYDREA) 500 MG capsule Take 2 capsules (1,000 mg total) by mouth daily. Take 2 capsules Monday Wednesday Friday and take 1 every other day of the week.may take with food to minimize GI side effects. 120 capsule 5  . Multiple Vitamins-Minerals (MULTIVITAMIN WITH MINERALS) tablet Take 1 tablet by mouth daily.    . Omega-3 Fatty Acids (FISH OIL) 500 MG CAPS Take 500 mg by mouth once.      No current facility-administered medications for this visit.    ALLERGIES:  No Known Allergies  PHYSICAL EXAM:  Performance status (ECOG): 1 - Symptomatic but completely ambulatory  There were no vitals filed for this visit. Wt Readings from Last 3 Encounters:  04/11/20 140 lb 12.8 oz (63.9 kg)  10/04/19 136 lb 9.6 oz (62 kg)  06/28/19 138 lb 9.6 oz (62.9 kg)   Physical Exam Vitals reviewed.  Constitutional:      Appearance: Normal appearance.  Cardiovascular:     Rate and Rhythm: Normal rate and regular rhythm.     Pulses: Normal pulses.     Heart sounds: Normal heart sounds.  Pulmonary:     Effort: Pulmonary effort is normal.     Breath sounds: Normal breath sounds.  Abdominal:     Palpations: Abdomen is soft. There is no hepatomegaly, splenomegaly or mass.     Tenderness: There is no abdominal tenderness.  Neurological:     General: No focal deficit present.     Mental Status: She is alert and oriented to person, place, and time.  Psychiatric:        Mood and Affect: Mood normal.         Behavior: Behavior normal.     LABORATORY DATA:  I have reviewed the labs as listed.  CBC Latest Ref Rng & Units 01/01/2021 08/28/2020 04/02/2020  WBC 4.0 - 10.5 K/uL 6.0 6.5 6.2  Hemoglobin 12.0 - 15.0 g/dL 12.7 13.8 12.9  Hematocrit 36.0 - 46.0 % 37.8 41.2 39.6  Platelets 150 - 400 K/uL 236 259 250   CMP Latest Ref Rng & Units 01/03/2021 08/28/2020 04/02/2020  Glucose 70 - 99 mg/dL 95 100(H) 93  BUN 8 - 23 mg/dL 11 10 11   Creatinine 0.44 - 1.00 mg/dL 0.60 0.70 0.60  Sodium 135 - 145 mmol/L 138 138 140  Potassium 3.5 - 5.1 mmol/L 4.9 4.2 4.0  Chloride 98 - 111 mmol/L 105 102 102  CO2 22 - 32 mmol/L 27 23 25   Calcium 8.9 - 10.3 mg/dL 9.4 10.0 9.7  Total Protein 6.5 - 8.1 g/dL 7.6 8.3(H) 8.1  Total Bilirubin 0.3 - 1.2 mg/dL 0.7 0.5 0.4  Alkaline Phos 38 - 126 U/L 75 75 79  AST 15 - 41 U/L 19 24 20   ALT 0 - 44 U/L 15 16 17       Component Value Date/Time   RBC 3.15 (L) 01/01/2021 1424   MCV 120.0 (H) 01/01/2021 1424   MCV 95 11/30/2017 0841   MCH 40.3 (H) 01/01/2021 1424   MCHC 33.6 01/01/2021 1424   RDW 13.5 01/01/2021 1424   RDW 15.7 (H) 11/30/2017 0841   LYMPHSABS 2.1 01/01/2021 1424   LYMPHSABS 2.2 11/30/2017 0841   MONOABS 0.6 01/01/2021 1424   EOSABS 0.1 01/01/2021 1424   EOSABS 0.2 11/30/2017 0841   BASOSABS 0.0 01/01/2021 1424   BASOSABS 0.1 11/30/2017 0841    DIAGNOSTIC IMAGING:  I have independently reviewed the scans and discussed with the patient. No results found.   ASSESSMENT:  1. [name]: -She had a bone marrow aspiration and biopsy on 01/11/2018 shows hypercellular (50 to 70% cellularity) marrow with trilineage hematopoiesis. Reticulin stain showed mild increase in reticulin fibers. Chromosome analysis shows 52, XX. -No prior history of vasomotor symptoms or thrombosis. Given her age more than 38, she was recommended to start on hydroxyurea. -She takes hydroxyurea 500 mg daily started on 12/26/2017, increased to 2 tablets daily on  02/07/2018. -On 07/22/2018, her platelet count dropped to 72. Hence we will cut back the dose of Hydrea to 2 tablets on Monday Wednesdays and Fridays and 1 tablet on the other days.   2. Sleeplessness: -She is taking Xanax 0.25 mg as needed for sleep which is helping.  3. Lung nodules: -Patient has a longtime smoking history. -CT of the chest on 03/25/2019 showed multiple small pulmonary nodules are again noted in the lungs bilaterally, largest of which is in the posterior aspect of the right upper lobe near the apex.  -CT of the chest on 04/02/2020 showed lung RADS 2 follow-up in 1 year    PLAN:  1. [name]: - She denies any aquagenic pruritus or vasomotor symptoms. - Reviewed labs from 01/03/2021.  Hematocrit is 37 and platelet count 236. - Continue Hydrea 2 tablets on Monday, Wednesday and Friday and 1 tablet rest of the week. - RTC 4 months for follow-up with repeat labs.   2. Sleeplessness: - Continue Xanax at bedtime as needed.   3.  Smoking history: - We will schedule her for low-dose lung cancer screening protocol in August 2022.   Orders placed this encounter:  No orders of the defined types were placed in this encounter.    Derek Jack, MD [place] [phone]   I, Thana Ates, am acting as a scribe for Dr. Derek Jack.  I, Derek Jack MD, have reviewed the above documentation for accuracy and completeness, and I agree with the above.

## 2021-01-08 ENCOUNTER — Inpatient Hospital Stay (HOSPITAL_BASED_OUTPATIENT_CLINIC_OR_DEPARTMENT_OTHER): Payer: Medicare Other | Admitting: Hematology

## 2021-01-08 ENCOUNTER — Other Ambulatory Visit: Payer: Self-pay

## 2021-01-08 VITALS — BP 137/66 | HR 67 | Temp 96.8°F | Resp 18 | Wt 145.5 lb

## 2021-01-08 DIAGNOSIS — D45 Polycythemia vera: Secondary | ICD-10-CM | POA: Diagnosis not present

## 2021-01-08 DIAGNOSIS — E785 Hyperlipidemia, unspecified: Secondary | ICD-10-CM | POA: Diagnosis not present

## 2021-01-08 DIAGNOSIS — D471 Chronic myeloproliferative disease: Secondary | ICD-10-CM

## 2021-01-08 DIAGNOSIS — G4700 Insomnia, unspecified: Secondary | ICD-10-CM | POA: Diagnosis not present

## 2021-01-08 DIAGNOSIS — R918 Other nonspecific abnormal finding of lung field: Secondary | ICD-10-CM | POA: Diagnosis not present

## 2021-01-08 DIAGNOSIS — Z1589 Genetic susceptibility to other disease: Secondary | ICD-10-CM

## 2021-01-08 NOTE — Patient Instructions (Signed)
[facility] at [facility] Discharge Instructions  You were seen today by Dr. Katragadda. He went over your recent results. You will be scheduled for a CT scan of your chest prior to your next visit. Dr. Katragadda will see you back in 4 months for labs and follow up.   Thank you for choosing [facility] at [facility] to provide your oncology and hematology care.  To afford each patient quality time with our provider, please arrive at least 15 minutes before your scheduled appointment time.   If you have a lab appointment with the Cancer Center please come in thru the Main Entrance and check in at the main information desk  You need to re-schedule your appointment should you arrive 10 or more minutes late.  We strive to give you quality time with our providers, and arriving late affects you and other patients whose appointments are after yours.  Also, if you no show three or more times for appointments you may be dismissed from the clinic at the providers discretion.     Again, thank you for choosing [facility].  Our hope is that these requests will decrease the amount of time that you wait before being seen by our physicians.       _____________________________________________________________  Should you have questions after your visit to [facility], please contact our office at [phone] between the hours of 8:00 a.m. and 4:30 p.m.  Voicemails left after 4:00 p.m. will not be returned until the following business day.  For prescription refill requests, have your pharmacy contact our office and allow 72 hours.    Cancer Center Support Programs:   > Cancer Support Group  2nd Tuesday of the month 1pm-2pm, Journey Room   

## 2021-03-29 ENCOUNTER — Other Ambulatory Visit (HOSPITAL_COMMUNITY): Payer: Self-pay

## 2021-03-29 DIAGNOSIS — Z1589 Genetic susceptibility to other disease: Secondary | ICD-10-CM

## 2021-03-29 MED ORDER — HYDROXYUREA 500 MG PO CAPS: 1000.0000 mg | ORAL_CAPSULE | Freq: Every day | ORAL | 5 refills | Status: DC

## 2021-04-02 ENCOUNTER — Other Ambulatory Visit (HOSPITAL_COMMUNITY): Payer: Self-pay

## 2021-04-02 ENCOUNTER — Encounter (HOSPITAL_COMMUNITY): Payer: Self-pay

## 2021-04-02 DIAGNOSIS — Z1589 Genetic susceptibility to other disease: Secondary | ICD-10-CM

## 2021-04-02 MED ORDER — HYDROXYUREA 500 MG PO CAPS: 1000.0000 mg | ORAL_CAPSULE | Freq: Every day | ORAL | 5 refills | Status: DC

## 2021-04-02 NOTE — Telephone Encounter (Signed)
Chart reviewed. Revlimid refilled per last office note with Dr. Katragadda.  

## 2021-04-02 NOTE — Progress Notes (Signed)
Disregard previous note, Hydrea refilled per last office visit with Faythe Casa, NP

## 2021-04-09 ENCOUNTER — Encounter (HOSPITAL_COMMUNITY): Payer: Self-pay | Admitting: *Deleted

## 2021-04-09 NOTE — Progress Notes (Signed)
Hydrea 500 mg verified and patient is to take 2 tablets on M,W,F and 1 all other days.  Clarification sent to [place] per request and was verified via [place].

## 2021-05-07 ENCOUNTER — Other Ambulatory Visit: Payer: Self-pay

## 2021-05-07 ENCOUNTER — Inpatient Hospital Stay (HOSPITAL_COMMUNITY): Payer: Medicare Other | Attending: Hematology

## 2021-05-07 ENCOUNTER — Ambulatory Visit (HOSPITAL_COMMUNITY)
Admission: RE | Admit: 2021-05-07 | Discharge: 2021-05-07 | Disposition: A | Discharge status: Home / Self Care | Payer: Medicare Other | Source: Ambulatory Visit | Attending: Hematology | Admitting: Hematology

## 2021-05-07 DIAGNOSIS — Z833 Family history of diabetes mellitus: Secondary | ICD-10-CM | POA: Diagnosis not present

## 2021-05-07 DIAGNOSIS — F17210 Nicotine dependence, cigarettes, uncomplicated: Secondary | ICD-10-CM | POA: Diagnosis not present

## 2021-05-07 DIAGNOSIS — Z8349 Family history of other endocrine, nutritional and metabolic diseases: Secondary | ICD-10-CM | POA: Diagnosis not present

## 2021-05-07 DIAGNOSIS — D471 Chronic myeloproliferative disease: Secondary | ICD-10-CM

## 2021-05-07 DIAGNOSIS — Z1589 Genetic susceptibility to other disease: Secondary | ICD-10-CM | POA: Diagnosis not present

## 2021-05-07 DIAGNOSIS — D45 Polycythemia vera: Secondary | ICD-10-CM | POA: Diagnosis not present

## 2021-05-07 DIAGNOSIS — Z79899 Other long term (current) drug therapy: Secondary | ICD-10-CM | POA: Insufficient documentation

## 2021-05-07 DIAGNOSIS — Z122 Encounter for screening for malignant neoplasm of respiratory organs: Secondary | ICD-10-CM | POA: Diagnosis not present

## 2021-05-07 DIAGNOSIS — Z8249 Family history of ischemic heart disease and other diseases of the circulatory system: Secondary | ICD-10-CM | POA: Insufficient documentation

## 2021-05-07 DIAGNOSIS — Z808 Family history of malignant neoplasm of other organs or systems: Secondary | ICD-10-CM | POA: Insufficient documentation

## 2021-05-07 LAB — CBC WITH DIFFERENTIAL/PLATELET
Abs Immature Granulocytes: 0.01 K/uL (ref 0.00–0.07)
Basophils Absolute: 0 K/uL (ref 0.0–0.1)
Basophils Relative: 0 %
Eosinophils Absolute: 0 K/uL (ref 0.0–0.5)
Eosinophils Relative: 1 %
HCT: 37.1 % (ref 36.0–46.0)
Hemoglobin: 12.7 g/dL (ref 12.0–15.0)
Immature Granulocytes: 0 %
Lymphocytes Relative: 29 %
Lymphs Abs: 1.8 K/uL (ref 0.7–4.0)
MCH: 41.1 pg — ABNORMAL HIGH (ref 26.0–34.0)
MCHC: 34.2 g/dL (ref 30.0–36.0)
MCV: 120.1 fL — ABNORMAL HIGH (ref 80.0–100.0)
Monocytes Absolute: 0.4 K/uL (ref 0.1–1.0)
Monocytes Relative: 7 %
Neutro Abs: 4 K/uL (ref 1.7–7.7)
Neutrophils Relative %: 63 %
Platelets: 275 K/uL (ref 150–400)
RBC: 3.09 MIL/uL — ABNORMAL LOW (ref 3.87–5.11)
RDW: 13.7 % (ref 11.5–15.5)
WBC: 6.3 K/uL (ref 4.0–10.5)
nRBC: 0 % (ref 0.0–0.2)

## 2021-05-07 LAB — COMPREHENSIVE METABOLIC PANEL
ALT: 17 U/L (ref 0–44)
AST: 21 U/L (ref 15–41)
Albumin: 4.4 g/dL (ref 3.5–5.0)
Alkaline Phosphatase: 80 U/L (ref 38–126)
Anion gap: 8 (ref 5–15)
BUN: 11 mg/dL (ref 8–23)
CO2: 25 mmol/L (ref 22–32)
Calcium: 9.8 mg/dL (ref 8.9–10.3)
Chloride: 103 mmol/L (ref 98–111)
Creatinine, Ser: 0.79 mg/dL (ref 0.44–1.00)
GFR, Estimated: >60 mL/min (ref >60)
Glucose, Bld: 98 mg/dL (ref 70–99)
Potassium: 3.7 mmol/L (ref 3.5–5.1)
Sodium: 136 mmol/L (ref 135–145)
Total Bilirubin: 0.4 mg/dL (ref 0.3–1.2)
Total Protein: 8.1 g/dL (ref 6.5–8.1)

## 2021-05-07 LAB — VITAMIN B12: Vitamin B-12: 528 pg/mL (ref 180–914)

## 2021-05-07 LAB — LACTATE DEHYDROGENASE: LDH: 184 U/L (ref 98–192)

## 2021-05-08 LAB — VITAMIN D 25 HYDROXY (VIT D DEFICIENCY, FRACTURES): Vit D, 25-Hydroxy: 47.92 ng/mL (ref 30–100)

## 2021-05-13 NOTE — Progress Notes (Signed)
[place], [place] [address]   CLINIC:  Medical Oncology/Hematology  PCP:  Sharilyn Sites, [place] / [place] [address]  [phone]  REASON FOR VISIT:  Follow-up for JAK2 positive polycythemia  PRIOR THERAPY: none  CURRENT THERAPY: Hydrea  INTERVAL HISTORY:  Denise Maynard, a 65 y.o. female, returns for routine follow-up for her JAK2 positive polycythemia. Denise Maynard was last seen on 01/08/2021.  Today she reports feeling good. She denies any recent infections, skin sores, n/v/d, and mouth sores. She takes Xanax prn for disrupted sleep. She take aspirin 81 mg. She reports mild itching on her back for 2-3 minutes occasionally after showers. She denies history of blood clots.   REVIEW OF SYSTEMS:  Review of Systems  Constitutional:  Negative for appetite change and fatigue.  HENT:   Negative for mouth sores.   Gastrointestinal:  Negative for diarrhea, nausea and vomiting.  Skin:  Positive for itching. Negative for wound.  Psychiatric/Behavioral:  Positive for sleep disturbance.   All other systems reviewed and are negative.  PAST MEDICAL/SURGICAL HISTORY:  Past Medical History:  Diagnosis Date   Carotid artery aneurysm (HCC)    Coil embolization of a supraclinoid right internal carotid artery - Dr. Kathyrn Sheriff   Hyperlipidemia    Nicotine addiction 05/15/2015   Will try patch   Serum calcium elevated 11/19/2017   recheck   Serum potassium elevated 11/19/2017   Stroke [facility])    Past Surgical History:  Procedure Laterality Date   ABDOMINAL AORTAGRAM N/A 11/24/2014   Procedure: ABDOMINAL Maxcine Ham;  Surgeon: Elam Dutch, MD;  Location: [facility] CATH LAB;  Service: Cardiovascular;  Laterality: N/A;   Aneursym repair  10/2014   COLONOSCOPY N/A 07/30/2015   Procedure: COLONOSCOPY;  Surgeon: Danie Binder, MD;  [facility] ENDO SUITE;  Service: Endoscopy;  Laterality: N/A;  1:00 Pm - moved to 1:30 - office to notify    FRACTURE SURGERY     Jaw   LOWER EXTREMITY ANGIOGRAM  11/24/2014   Procedure: LOWER EXTREMITY ANGIOGRAM;  Surgeon: Elam Dutch, MD;  Location: [facility] CATH LAB;  Service: Cardiovascular;;   MOUTH SURGERY     RADIOLOGY WITH ANESTHESIA N/A 10/12/2014   Procedure: Embolization/arteriogram;  Surgeon: Consuella Lose, MD;  Location: [place];  Service: Radiology;  Laterality: N/A;    SOCIAL HISTORY:  Social History   Socioeconomic History   Marital status: Divorced    Spouse name: Not on file   Number of children: Not on file   Years of education: Not on file   Highest education level: Not on file  Occupational History   Not on file  Tobacco Use   Smoking status: Every Day    Packs/day: 0.50    Years: 30.00    Pack years: 15.00    Types: Cigarettes   Smokeless tobacco: Never  Substance and Sexual Activity   Alcohol use: No    Alcohol/week: 0.0 standard drinks   Drug use: No   Sexual activity: Yes    Birth control/protection: Post-menopausal  Other Topics Concern   Not on file  Social History Narrative   Not on file   Social Determinants of Health   Financial Resource Strain: Not on file  Food Insecurity: Not on file  Transportation Needs: Not on file  Physical Activity: Not on file  Stress: Not on file  Social Connections: Not on file  Intimate Partner Violence: Not on file    FAMILY HISTORY:  Family History  Problem Relation Age of Onset   Heart attack Mother    Diabetes Mother    Throat cancer Father    Hypertension Father    Hyperlipidemia Sister    Diabetes Maternal Grandmother    Cancer Maternal Grandfather    Heart attack Paternal Grandfather    Hyperlipidemia Sister     CURRENT MEDICATIONS:  Current Outpatient Medications  Medication Sig Dispense Refill   acetaminophen (TYLENOL) 500 MG tablet Take 1,000 mg by mouth every 6 (six) hours as needed for mild pain or moderate pain.  (Patient not taking: Reported on 01/08/2021)     ALPRAZolam (XANAX) 0.5 MG  tablet Take 0.5 mg by mouth at bedtime.     Artificial Tear Ointment (DRY EYES OP) Apply to eye daily.      aspirin EC 81 MG tablet Take 81 mg by mouth daily.     cholecalciferol (VITAMIN D) 1000 UNITS tablet Take 4,000 Units by mouth daily.     fluocinonide gel (LIDEX) 0.05 % Apply topically.     hydroxyurea (HYDREA) 500 MG capsule Take 2 capsules (1,000 mg total) by mouth daily. Take 2 capsules Monday Wednesday Friday and take 1 every other day of the week.may take with food to minimize GI side effects. 120 capsule 5   Multiple Vitamins-Minerals (MULTIVITAMIN WITH MINERALS) tablet Take 1 tablet by mouth daily.     Omega-3 Fatty Acids (FISH OIL) 500 MG CAPS Take 500 mg by mouth once.      No current facility-administered medications for this visit.    ALLERGIES:  No Known Allergies  PHYSICAL EXAM:  Performance status (ECOG): 1 - Symptomatic but completely ambulatory  There were no vitals filed for this visit. Wt Readings from Last 3 Encounters:  01/08/21 145 lb 8 oz (66 kg)  04/11/20 140 lb 12.8 oz (63.9 kg)  10/04/19 136 lb 9.6 oz (62 kg)   Physical Exam Vitals reviewed.  Constitutional:      Appearance: Normal appearance.  Cardiovascular:     Rate and Rhythm: Normal rate and regular rhythm.     Pulses: Normal pulses.     Heart sounds: Normal heart sounds.  Pulmonary:     Effort: Pulmonary effort is normal.     Breath sounds: Normal breath sounds.  Neurological:     General: No focal deficit present.     Mental Status: She is alert and oriented to person, place, and time.  Psychiatric:        Mood and Affect: Mood normal.        Behavior: Behavior normal.    LABORATORY DATA:  I have reviewed the labs as listed.  CBC Latest Ref Rng & Units 05/07/2021 01/01/2021 08/28/2020  WBC 4.0 - 10.5 K/uL 6.3 6.0 6.5  Hemoglobin 12.0 - 15.0 g/dL 12.7 12.7 13.8  Hematocrit 36.0 - 46.0 % 37.1 37.8 41.2  Platelets 150 - 400 K/uL 275 236 259   CMP Latest Ref Rng & Units 05/07/2021  01/03/2021 08/28/2020  Glucose 70 - 99 mg/dL 98 95 100(H)  BUN 8 - 23 mg/dL 11 11 10   Creatinine 0.44 - 1.00 mg/dL 0.79 0.60 0.70  Sodium 135 - 145 mmol/L 136 138 138  Potassium 3.5 - 5.1 mmol/L 3.7 4.9 4.2  Chloride 98 - 111 mmol/L 103 105 102  CO2 22 - 32 mmol/L 25 27 23   Calcium 8.9 - 10.3 mg/dL 9.8 9.4 10.0  Total Protein 6.5 - 8.1 g/dL 8.1 7.6 8.3(H)  Total Bilirubin 0.3 -  1.2 mg/dL 0.4 0.7 0.5  Alkaline Phos 38 - 126 U/L 80 75 75  AST 15 - 41 U/L 21 19 24   ALT 0 - 44 U/L 17 15 16       Component Value Date/Time   RBC 3.09 (L) 05/07/2021 1505   MCV 120.1 (H) 05/07/2021 1505   MCV 95 11/30/2017 0841   MCH 41.1 (H) 05/07/2021 1505   MCHC 34.2 05/07/2021 1505   RDW 13.7 05/07/2021 1505   RDW 15.7 (H) 11/30/2017 0841   LYMPHSABS 1.8 05/07/2021 1505   LYMPHSABS 2.2 11/30/2017 0841   MONOABS 0.4 05/07/2021 1505   EOSABS 0.0 05/07/2021 1505   EOSABS 0.2 11/30/2017 0841   BASOSABS 0.0 05/07/2021 1505   BASOSABS 0.1 11/30/2017 0841    DIAGNOSTIC IMAGING:  I have independently reviewed the scans and discussed with the patient. CT CHEST LUNG CA SCREEN LOW DOSE W/O CM  Result Date: 05/13/2021 CLINICAL DATA:  65 year old asymptomatic female current smoker with 37 pack-year smoking history. EXAM: CT CHEST WITHOUT CONTRAST LOW-DOSE FOR LUNG CANCER SCREENING TECHNIQUE: Multidetector CT imaging of the chest was performed following the standard protocol without IV contrast. COMPARISON:  04/02/2020 screening chest CT. FINDINGS: Cardiovascular: Normal heart size. No significant pericardial effusion/thickening. Left anterior descending coronary atherosclerosis. Atherosclerotic nonaneurysmal thoracic aorta. Normal caliber pulmonary arteries. Mediastinum/Nodes: No discrete thyroid nodules. Unremarkable esophagus. No pathologically enlarged axillary, mediastinal or hilar lymph nodes, noting limited sensitivity for the detection of hilar adenopathy on this noncontrast study. Simple 1.5 cm anterior  pericardial cyst, stable. Lungs/Pleura: No pneumothorax. No pleural effusion. Mild centrilobular and paraseptal emphysema with mild diffuse bronchial wall thickening. No acute consolidative airspace disease or lung masses. No significant growth of numerous previously visualized scattered bilateral pulmonary nodules. No new significant pulmonary nodules. Upper abdomen: Right adrenal 1.4 cm nodule with density -14 HU, stable, compatible with an adenoma. Musculoskeletal: No aggressive appearing focal osseous lesions. Moderate thoracic spondylosis. IMPRESSION: 1. Lung-RADS 2, benign appearance or behavior. Continue annual screening with low-dose chest CT without contrast in 12 months. 2. One vessel coronary atherosclerosis. 3. Stable right adrenal adenoma. 4. Aortic Atherosclerosis (ICD10-I70.0) and Emphysema (ICD10-J43.9). Electronically Signed   By: Ilona Sorrel M.D.   On: 05/13/2021 10:03     ASSESSMENT:  1.  [name]: - She had a bone marrow aspiration and biopsy on 01/11/2018 shows hypercellular (50 to 70% cellularity) marrow with trilineage hematopoiesis.  Reticulin stain showed mild increase in reticulin fibers.  Chromosome analysis shows 20, XX. - No prior history of vasomotor symptoms or thrombosis.  Given her age more than 78, she was recommended to start on hydroxyurea. - She takes hydroxyurea 500 mg daily started on 12/26/2017, increased to 2 tablets daily on 02/07/2018. -On 07/22/2018, her platelet count dropped to 72.  Hence we will cut back the dose of Hydrea to 2 tablets on Monday Wednesdays and Fridays and 1 tablet on the other days.     2.  Sleeplessness: - She is taking Xanax 0.25 mg as needed for sleep which is helping.   3.  Lung nodules: -Patient has a longtime smoking history. -CT of the chest on 03/25/2019 showed multiple small pulmonary nodules are again noted in the lungs bilaterally, largest of which is in the posterior aspect of the right upper lobe near the apex.   -CT of the chest on 04/02/2020 showed lung RADS 2 follow-up in 1 year   PLAN:  1.  [name]: - She has very mild  aquagenic pruritus.  No vasomotor symptoms.  No prior history of DVT. - Reviewed labs from 05/07/2021.  Hematocrit is 37.  Platelet count is 275.  Both are within target range.  White count is normal.  LFTs are normal.  Macrocytosis from Hydrea.  No skin ulcers. - Continue Hydrea 2 tablets on Monday, Wednesday and Friday and 1 tablet rest of the week. - RTC 4 months for follow-up.     2.  Sleeplessness: - She rarely requires Xanax 0.5 mg at bedtime for sleep.  We have given a refill.     3.  Smoking history: - Reviewed results of CT low-dose screening scan from 05/07/2021, lung RADS 2.  Stable benign right adrenal adenoma.  Orders placed this encounter:  No orders of the defined types were placed in this encounter.    Derek Jack, MD [place] [phone]   I, Thana Ates, am acting as a scribe for Dr. Derek Jack.  I, Derek Jack MD, have reviewed the above documentation for accuracy and completeness, and I agree with the above.

## 2021-05-14 ENCOUNTER — Inpatient Hospital Stay (HOSPITAL_COMMUNITY): Payer: Medicare Other | Attending: Hematology | Admitting: Hematology

## 2021-05-14 ENCOUNTER — Other Ambulatory Visit (HOSPITAL_COMMUNITY): Payer: Self-pay | Admitting: *Deleted

## 2021-05-14 ENCOUNTER — Other Ambulatory Visit: Payer: Self-pay

## 2021-05-14 VITALS — BP 135/74 | HR 63 | Temp 97.7°F | Resp 18 | Wt 143.6 lb

## 2021-05-14 DIAGNOSIS — D471 Chronic myeloproliferative disease: Secondary | ICD-10-CM | POA: Diagnosis not present

## 2021-05-14 DIAGNOSIS — D45 Polycythemia vera: Secondary | ICD-10-CM | POA: Diagnosis not present

## 2021-05-14 DIAGNOSIS — G4700 Insomnia, unspecified: Secondary | ICD-10-CM | POA: Insufficient documentation

## 2021-05-14 DIAGNOSIS — Z1589 Genetic susceptibility to other disease: Secondary | ICD-10-CM

## 2021-05-14 NOTE — Patient Instructions (Addendum)
[facility] at [facility] °Discharge Instructions ° °You were seen today by Dr. Katragadda. He went over your recent results. Dr. Katragadda will see you back in 4 months for labs and follow up. ° ° °Thank you for choosing [facility] at [facility] to provide your oncology and hematology care.  To afford each patient quality time with our provider, please arrive at least 15 minutes before your scheduled appointment time.  ° °If you have a lab appointment with the Cancer Center please come in thru the Main Entrance and check in at the main information desk ° °You need to re-schedule your appointment should you arrive 10 or more minutes late.  We strive to give you quality time with our providers, and arriving late affects you and other patients whose appointments are after yours.  Also, if you no show three or more times for appointments you may be dismissed from the clinic at the providers discretion.     °Again, thank you for choosing [facility].  Our hope is that these requests will decrease the amount of time that you wait before being seen by our physicians.       °_____________________________________________________________ ° °Should you have questions after your visit to [facility], please contact our office at [phone] between the hours of 8:00 a.m. and 4:30 p.m.  Voicemails left after 4:00 p.m. will not be returned until the following business day.  For prescription refill requests, have your pharmacy contact our office and allow 72 hours.   ° °Cancer Center Support Programs:  ° °> Cancer Support Group  °2nd Tuesday of the month 1pm-2pm, Journey Room  ° ° °

## 2021-05-15 MED ORDER — ALPRAZOLAM 0.5 MG PO TABS: 0.5000 mg | ORAL_TABLET | Freq: Every day | ORAL | 0 refills | Status: AC

## 2021-06-05 ENCOUNTER — Encounter (HOSPITAL_COMMUNITY): Payer: Self-pay

## 2021-06-05 NOTE — Progress Notes (Signed)
Patient notified of LDCT Lung Cancer Screening Results via mail with the recommendation to follow-up in 12 months. Patient's referring provider has been sent a copy of results. Results are as follows:   IMPRESSION: 1. Lung-RADS 2, benign appearance or behavior. Continue annual screening with low-dose chest CT without contrast in 12 months. 2. One vessel coronary atherosclerosis. 3. Stable right adrenal adenoma. 4. Aortic Atherosclerosis (ICD10-I70.0) and Emphysema (ICD10-J43.9).

## 2021-08-19 ENCOUNTER — Other Ambulatory Visit (HOSPITAL_COMMUNITY): Payer: Self-pay | Admitting: Family Medicine

## 2021-08-19 ENCOUNTER — Other Ambulatory Visit (HOSPITAL_COMMUNITY): Payer: Self-pay | Admitting: Adult Health

## 2021-08-19 DIAGNOSIS — Z1231 Encounter for screening mammogram for malignant neoplasm of breast: Secondary | ICD-10-CM

## 2021-08-26 ENCOUNTER — Other Ambulatory Visit: Payer: Self-pay

## 2021-08-26 ENCOUNTER — Ambulatory Visit (HOSPITAL_COMMUNITY)
Admission: RE | Admit: 2021-08-26 | Discharge: 2021-08-26 | Disposition: A | Discharge status: Home / Self Care | Payer: Medicare Other | Source: Ambulatory Visit | Attending: Adult Health | Admitting: Adult Health

## 2021-08-26 DIAGNOSIS — Z1231 Encounter for screening mammogram for malignant neoplasm of breast: Secondary | ICD-10-CM | POA: Insufficient documentation

## 2021-09-19 ENCOUNTER — Other Ambulatory Visit: Payer: Self-pay

## 2021-09-19 ENCOUNTER — Inpatient Hospital Stay (HOSPITAL_COMMUNITY): Payer: Medicare Other

## 2021-09-19 ENCOUNTER — Inpatient Hospital Stay (HOSPITAL_COMMUNITY): Payer: Medicare Other | Attending: Hematology | Admitting: Hematology

## 2021-09-19 VITALS — BP 128/60 | HR 62 | Temp 97.7°F | Resp 18 | Ht 65.0 in | Wt 142.5 lb

## 2021-09-19 DIAGNOSIS — G4700 Insomnia, unspecified: Secondary | ICD-10-CM | POA: Insufficient documentation

## 2021-09-19 DIAGNOSIS — D471 Chronic myeloproliferative disease: Secondary | ICD-10-CM | POA: Diagnosis not present

## 2021-09-19 DIAGNOSIS — R918 Other nonspecific abnormal finding of lung field: Secondary | ICD-10-CM | POA: Diagnosis not present

## 2021-09-19 DIAGNOSIS — Z79899 Other long term (current) drug therapy: Secondary | ICD-10-CM | POA: Insufficient documentation

## 2021-09-19 DIAGNOSIS — F17210 Nicotine dependence, cigarettes, uncomplicated: Secondary | ICD-10-CM | POA: Diagnosis not present

## 2021-09-19 DIAGNOSIS — Z7982 Long term (current) use of aspirin: Secondary | ICD-10-CM | POA: Diagnosis not present

## 2021-09-19 DIAGNOSIS — D45 Polycythemia vera: Secondary | ICD-10-CM | POA: Insufficient documentation

## 2021-09-19 DIAGNOSIS — Z1589 Genetic susceptibility to other disease: Secondary | ICD-10-CM

## 2021-09-19 LAB — COMPREHENSIVE METABOLIC PANEL
ALT: 16 U/L (ref 0–44)
AST: 18 U/L (ref 15–41)
Albumin: 4 g/dL (ref 3.5–5.0)
Alkaline Phosphatase: 70 U/L (ref 38–126)
Anion gap: 8 (ref 5–15)
BUN: 13 mg/dL (ref 8–23)
CO2: 27 mmol/L (ref 22–32)
Calcium: 9.5 mg/dL (ref 8.9–10.3)
Chloride: 101 mmol/L (ref 98–111)
Creatinine, Ser: 0.71 mg/dL (ref 0.44–1.00)
GFR, Estimated: >60 mL/min (ref >60)
Glucose, Bld: 111 mg/dL — ABNORMAL HIGH (ref 70–99)
Potassium: 3.4 mmol/L — ABNORMAL LOW (ref 3.5–5.1)
Sodium: 136 mmol/L (ref 135–145)
Total Bilirubin: 0.3 mg/dL (ref 0.3–1.2)
Total Protein: 7.4 g/dL (ref 6.5–8.1)

## 2021-09-19 LAB — CBC WITH DIFFERENTIAL/PLATELET
Abs Immature Granulocytes: 0.01 10*3/uL (ref 0.00–0.07)
Basophils Absolute: 0 10*3/uL (ref 0.0–0.1)
Basophils Relative: 0 %
Eosinophils Absolute: 0 10*3/uL (ref 0.0–0.5)
Eosinophils Relative: 0 %
HCT: 35.8 % — ABNORMAL LOW (ref 36.0–46.0)
Hemoglobin: 12 g/dL (ref 12.0–15.0)
Immature Granulocytes: 0 %
Lymphocytes Relative: 41 %
Lymphs Abs: 2.1 10*3/uL (ref 0.7–4.0)
MCH: 40 pg — ABNORMAL HIGH (ref 26.0–34.0)
MCHC: 33.5 g/dL (ref 30.0–36.0)
MCV: 119.3 fL — ABNORMAL HIGH (ref 80.0–100.0)
Monocytes Absolute: 0.4 10*3/uL (ref 0.1–1.0)
Monocytes Relative: 8 %
Neutro Abs: 2.5 10*3/uL (ref 1.7–7.7)
Neutrophils Relative %: 51 %
Platelets: 214 10*3/uL (ref 150–400)
RBC: 3 MIL/uL — ABNORMAL LOW (ref 3.87–5.11)
RDW: 13.2 % (ref 11.5–15.5)
WBC: 5 10*3/uL (ref 4.0–10.5)
nRBC: 0 % (ref 0.0–0.2)

## 2021-09-19 LAB — VITAMIN B12: Vitamin B-12: 575 pg/mL (ref 180–914)

## 2021-09-19 LAB — LACTATE DEHYDROGENASE: LDH: 155 U/L (ref 98–192)

## 2021-09-19 LAB — VITAMIN D 25 HYDROXY (VIT D DEFICIENCY, FRACTURES): Vit D, 25-Hydroxy: 42.83 ng/mL (ref 30–100)

## 2021-09-19 NOTE — Patient Instructions (Addendum)
Denise Maynard at [facility] Discharge Instructions  You were seen and examined today by Dr. Delton Coombes. Start taking the Hydrea once daily. He reviewed your most recent labs and everything looks good. Watch for skin break down or sores around your ankles. Please keep follow up appointment as scheduled in 4 months.   Thank you for choosing [facility] to provide your oncology and hematology care.  To afford each patient quality time with our provider, please arrive at least 15 minutes before your scheduled appointment time.   If you have a lab appointment with the [facility] please come in thru the Main Entrance and check in at the main information desk.  You need to re-schedule your appointment should you arrive 10 or more minutes late.  We strive to give you quality time with our providers, and arriving late affects you and other patients whose appointments are after yours.  Also, if you no show three or more times for appointments you may be dismissed from the clinic at the providers discretion.     Again, thank you for choosing [facility].  Our hope is that these requests will decrease the amount of time that you wait before being seen by our physicians.       _____________________________________________________________  Should you have questions after your visit to [facility], please contact our office at [phone] and follow the prompts.  Our office hours are 8:00 a.m. and 4:30 p.m. Monday - Friday.  Please note that voicemails left after 4:00 p.m. may not be returned until the following business day.  We are closed weekends and major holidays.  You do have access to a nurse 24-7, just call the main number to the clinic [phone] and do not press any options, hold on the line and a nurse will answer the phone.    For prescription refill requests, have your pharmacy contact our office and allow 72  hours.    Due to Covid, you will need to wear a mask upon entering the hospital. If you do not have a mask, a mask will be given to you at the Main Entrance upon arrival. For doctor visits, patients may have 1 support person age 35 or older with them. For treatment visits, patients can not have anyone with them due to social distancing guidelines and our immunocompromised population.

## 2021-09-19 NOTE — Progress Notes (Signed)
[name], [place] [address]   CLINIC:  Medical Oncology/Hematology  PCP:  Sharilyn Sites, [place] / [place] [address]  [phone]  REASON FOR VISIT:  Follow-up for JAK2 positive polycythemia  PRIOR THERAPY: none  CURRENT THERAPY: hydrea  INTERVAL HISTORY:  [name], a 66 y.o. female, returns for routine follow-up for her JAK2 positive polycythemia. Denise Maynard was last seen on 05/14/2021.  Today she reports feeling good. She reports mild itching on her upper for a few minutes following showers. She denies colors changing of her fingertips. She denies recent fevers. She reports 1 cold since her last visit which was productive of yellow mucous. She denies unusual fatigue.   REVIEW OF SYSTEMS:  Review of Systems  Constitutional:  Negative for appetite change and fatigue.  Musculoskeletal:  Positive for neck pain (3/10).  Skin:  Positive for itching.  All other systems reviewed and are negative.  PAST MEDICAL/SURGICAL HISTORY:  Past Medical History:  Diagnosis Date   Carotid artery aneurysm (HCC)    Coil embolization of a supraclinoid right internal carotid artery - Dr. Kathyrn Sheriff   Hyperlipidemia    Nicotine addiction 05/15/2015   Will try patch   Serum calcium elevated 11/19/2017   recheck   Serum potassium elevated 11/19/2017   Stroke [facility])    Past Surgical History:  Procedure Laterality Date   ABDOMINAL AORTAGRAM N/A 11/24/2014   Procedure: ABDOMINAL Maxcine Ham;  Surgeon: Elam Dutch, MD;  Location: [facility] CATH LAB;  Service: Cardiovascular;  Laterality: N/A;   Aneursym repair  10/2014   COLONOSCOPY N/A 07/30/2015   Procedure: COLONOSCOPY;  Surgeon: Danie Binder, MD;  [facility]O [facility];  Service: Endoscopy;  Laterality: N/A;  1:00 Pm - moved to 1:30 - office to notify   FRACTURE SURGERY     Jaw   LOWER EXTREMITY ANGIOGRAM  11/24/2014   Procedure: LOWER EXTREMITY ANGIOGRAM;  Surgeon: Elam Dutch,  MD;  Location: [facility] CATH LAB;  Service: Cardiovascular;;   MOUTH SURGERY     RADIOLOGY WITH ANESTHESIA N/A 10/12/2014   Procedure: Embolization/arteriogram;  Surgeon: Consuella Lose, MD;  Location: [place];  Service: Radiology;  Laterality: N/A;    SOCIAL HISTORY:  Social History   Socioeconomic History   Marital status: Divorced    Spouse name: Not on file   Number of children: Not on file   Years of education: Not on file   Highest education level: Not on file  Occupational History   Not on file  Tobacco Use   Smoking status: Every Day    Packs/day: 0.50    Years: 30.00    Pack years: 15.00    Types: Cigarettes   Smokeless tobacco: Never  Substance and Sexual Activity   Alcohol use: No    Alcohol/week: 0.0 standard drinks   Drug use: No   Sexual activity: Yes    Birth control/protection: Post-menopausal  Other Topics Concern   Not on file  Social History Narrative   Not on file   Social Determinants of Health   Financial Resource Strain: Not on file  Food Insecurity: Not on file  Transportation Needs: Not on file  Physical Activity: Not on file  Stress: Not on file  Social Connections: Not on file  Intimate Partner Violence: Not on file    FAMILY HISTORY:  Family History  Problem Relation Age of Onset   Heart attack Mother    Diabetes Mother  Throat cancer Father    Hypertension Father    Hyperlipidemia Sister    Diabetes Maternal Grandmother    Cancer Maternal Grandfather    Heart attack Paternal Grandfather    Hyperlipidemia Sister     CURRENT MEDICATIONS:  Current Outpatient Medications  Medication Sig Dispense Refill   acetaminophen (TYLENOL) 500 MG tablet Take 1,000 mg by mouth every 6 (six) hours as needed for mild pain or moderate pain.     ALPRAZolam (XANAX) 0.5 MG tablet Take 1 tablet (0.5 mg total) by mouth at bedtime. 30 tablet 0   Artificial Tear Ointment (DRY EYES OP) Apply to eye daily.      aspirin EC 81 MG tablet Take 81 mg by mouth  daily.     cholecalciferol (VITAMIN D) 1000 UNITS tablet Take 4,000 Units by mouth daily.     fluocinonide gel (LIDEX) 0.05 % Apply topically.     hydroxyurea (HYDREA) 500 MG capsule Take 2 capsules (1,000 mg total) by mouth daily. Take 2 capsules Monday Wednesday Friday and take 1 every other day of the week.may take with food to minimize GI side effects. 120 capsule 5   Multiple Vitamins-Minerals (MULTIVITAMIN WITH MINERALS) tablet Take 1 tablet by mouth daily.     Omega-3 Fatty Acids (FISH OIL) 500 MG CAPS Take 500 mg by mouth once.      No current facility-administered medications for this visit.    ALLERGIES:  No Known Allergies  PHYSICAL EXAM:  Performance status (ECOG): 1 - Symptomatic but completely ambulatory  Vitals:   09/19/21 1458  BP: 128/60  Pulse: 62  Resp: 18  Temp: 97.7 F (36.5 C)  SpO2: 100%   Wt Readings from Last 3 Encounters:  09/19/21 142 lb 8 oz (64.6 kg)  05/14/21 143 lb 9.6 oz (65.1 kg)  01/08/21 145 lb 8 oz (66 kg)   Physical Exam Vitals reviewed.  Constitutional:      Appearance: Normal appearance.  Cardiovascular:     Rate and Rhythm: Normal rate and regular rhythm.     Pulses: Normal pulses.     Heart sounds: Normal heart sounds.  Pulmonary:     Effort: Pulmonary effort is normal.     Breath sounds: Normal breath sounds.  Abdominal:     Palpations: Abdomen is soft. There is no hepatomegaly, splenomegaly or mass.     Tenderness: There is no abdominal tenderness.  Musculoskeletal:     Right lower leg: No edema.     Left lower leg: No edema.  Neurological:     General: No focal deficit present.     Mental Status: She is alert and oriented to person, place, and time.  Psychiatric:        Mood and Affect: Mood normal.        Behavior: Behavior normal.    LABORATORY DATA:  I have reviewed the labs as listed.  CBC Latest Ref Rng & Units 09/19/2021 05/07/2021 01/01/2021  WBC 4.0 - 10.5 K/uL 5.0 6.3 6.0  Hemoglobin 12.0 - 15.0 g/dL 12.0 12.7  12.7  Hematocrit 36.0 - 46.0 % 35.8(L) 37.1 37.8  Platelets 150 - 400 K/uL 214 275 236   CMP Latest Ref Rng & Units 09/19/2021 05/07/2021 01/03/2021  Glucose 70 - 99 mg/dL 111(H) 98 95  BUN 8 - 23 mg/dL 13 11 11   Creatinine 0.44 - 1.00 mg/dL 0.71 0.79 0.60  Sodium 135 - 145 mmol/L 136 136 138  Potassium 3.5 - 5.1 mmol/L 3.4(L) 3.7 4.9  Chloride 98 - 111 mmol/L 101 103 105  CO2 22 - 32 mmol/L 27 25 27   Calcium 8.9 - 10.3 mg/dL 9.5 9.8 9.4  Total Protein 6.5 - 8.1 g/dL 7.4 8.1 7.6  Total Bilirubin 0.3 - 1.2 mg/dL 0.3 0.4 0.7  Alkaline Phos 38 - 126 U/L 70 80 75  AST 15 - 41 U/L 18 21 19   ALT 0 - 44 U/L 16 17 15       Component Value Date/Time   RBC 3.00 (L) 09/19/2021 1414   MCV 119.3 (H) 09/19/2021 1414   MCV 95 11/30/2017 0841   MCH 40.0 (H) 09/19/2021 1414   MCHC 33.5 09/19/2021 1414   RDW 13.2 09/19/2021 1414   RDW 15.7 (H) 11/30/2017 0841   LYMPHSABS PENDING 09/19/2021 1414   LYMPHSABS 2.2 11/30/2017 0841   MONOABS PENDING 09/19/2021 1414   EOSABS PENDING 09/19/2021 1414   EOSABS 0.2 11/30/2017 0841   BASOSABS PENDING 09/19/2021 1414   BASOSABS 0.1 11/30/2017 0841    DIAGNOSTIC IMAGING:  I have independently reviewed the scans and discussed with the patient. MM 3D SCREEN BREAST BILATERAL  Result Date: 08/26/2021 CLINICAL DATA:  Screening. EXAM: DIGITAL SCREENING BILATERAL MAMMOGRAM WITH TOMOSYNTHESIS AND CAD TECHNIQUE: Bilateral screening digital craniocaudal and mediolateral oblique mammograms were obtained. Bilateral screening digital breast tomosynthesis was performed. The images were evaluated with computer-aided detection. COMPARISON:  Previous exam(s). ACR Breast Density Category b: There are scattered areas of fibroglandular density. FINDINGS: There are no findings suspicious for malignancy. IMPRESSION: No mammographic evidence of malignancy. A result letter of this screening mammogram will be mailed directly to the patient. RECOMMENDATION: Screening mammogram in one  year. (Code:SM-B-01Y) BI-RADS CATEGORY  1: Negative. Electronically Signed   By: Lillia Mountain M.D.   On: 08/26/2021 16:52     ASSESSMENT:  1.  [name]: - She had a bone marrow aspiration and biopsy on 01/11/2018 shows hypercellular (50 to 70% cellularity) marrow with trilineage hematopoiesis.  Reticulin stain showed mild increase in reticulin fibers.  Chromosome analysis shows 71, XX. - No prior history of vasomotor symptoms or thrombosis.  Given her age more than 78, she was recommended to start on hydroxyurea. - She takes hydroxyurea 500 mg daily started on 12/26/2017, increased to 2 tablets daily on 02/07/2018. -On 07/22/2018, her platelet count dropped to 72.  Hence we will cut back the dose of Hydrea to 2 tablets on Monday Wednesdays and Fridays and 1 tablet on the other days.   2.  Sleeplessness: - She is taking Xanax 0.25 mg as needed for sleep which is helping.   3.  Lung nodules: -Patient has a longtime smoking history. -CT of the chest on 03/25/2019 showed multiple small pulmonary nodules are again noted in the lungs bilaterally, largest of which is in the posterior aspect of the right upper lobe near the apex.  -CT of the chest on 04/02/2020 showed lung RADS 2 follow-up in 1 year  PLAN:  1.  [name]: -She has mild aquagenic pruritus lasting few minutes.  No vasomotor symptoms.  No prior history of thrombosis. - She is taking hydroxyurea 2 tablets on Monday Wednesday Friday and 1 tablet rest of the week. - Physical examination today did not reveal any palpable splenomegaly. - Reviewed labs today which showed normal LFTs.  Hematocrit is 35.  Platelet count 214 and white count is normal. - Recommend cutting back on hydroxyurea to 1 tablet daily. - Recommend follow-up in 4 months with repeat  labs.     2.  Sleeplessness: - Continue Xanax as needed for sleep.     3.  Smoking history: - CT low-dose screening scan from 05/07/2021, lung RADS 2.  Stable  benign right adrenal adenoma.  Orders placed this encounter:  No orders of the defined types were placed in this encounter.    Denise Jack, MD [place] [phone]   I, Denise Maynard, am acting as a scribe for Dr. Derek Maynard.  I, Denise Jack MD, have reviewed the above documentation for accuracy and completeness, and I agree with the above.

## 2022-01-16 ENCOUNTER — Inpatient Hospital Stay (HOSPITAL_COMMUNITY): Payer: Medicare Other

## 2022-01-16 ENCOUNTER — Inpatient Hospital Stay (HOSPITAL_COMMUNITY): Payer: Medicare Other | Attending: Hematology | Admitting: Hematology

## 2022-01-16 VITALS — BP 119/75 | HR 66 | Temp 97.6°F | Resp 18 | Ht 65.0 in | Wt 142.3 lb

## 2022-01-16 DIAGNOSIS — Z87891 Personal history of nicotine dependence: Secondary | ICD-10-CM

## 2022-01-16 DIAGNOSIS — Z122 Encounter for screening for malignant neoplasm of respiratory organs: Secondary | ICD-10-CM | POA: Diagnosis not present

## 2022-01-16 DIAGNOSIS — Z1589 Genetic susceptibility to other disease: Secondary | ICD-10-CM | POA: Diagnosis not present

## 2022-01-16 DIAGNOSIS — R918 Other nonspecific abnormal finding of lung field: Secondary | ICD-10-CM | POA: Insufficient documentation

## 2022-01-16 DIAGNOSIS — D471 Chronic myeloproliferative disease: Secondary | ICD-10-CM

## 2022-01-16 DIAGNOSIS — D45 Polycythemia vera: Secondary | ICD-10-CM | POA: Insufficient documentation

## 2022-01-16 DIAGNOSIS — G4700 Insomnia, unspecified: Secondary | ICD-10-CM | POA: Diagnosis not present

## 2022-01-16 LAB — COMPREHENSIVE METABOLIC PANEL
ALT: 17 U/L (ref 0–44)
AST: 22 U/L (ref 15–41)
Albumin: 4 g/dL (ref 3.5–5.0)
Alkaline Phosphatase: 77 U/L (ref 38–126)
Anion gap: 10 (ref 5–15)
BUN: 12 mg/dL (ref 8–23)
CO2: 26 mmol/L (ref 22–32)
Calcium: 9.4 mg/dL (ref 8.9–10.3)
Chloride: 103 mmol/L (ref 98–111)
Creatinine, Ser: 0.75 mg/dL (ref 0.44–1.00)
GFR, Estimated: >60 mL/min (ref >60)
Glucose, Bld: 153 mg/dL — ABNORMAL HIGH (ref 70–99)
Potassium: 3.6 mmol/L (ref 3.5–5.1)
Sodium: 139 mmol/L (ref 135–145)
Total Bilirubin: 0.5 mg/dL (ref 0.3–1.2)
Total Protein: 7.4 g/dL (ref 6.5–8.1)

## 2022-01-16 LAB — CBC WITH DIFFERENTIAL/PLATELET
Abs Immature Granulocytes: 0.02 10*3/uL (ref 0.00–0.07)
Basophils Absolute: 0 10*3/uL (ref 0.0–0.1)
Basophils Relative: 0 %
Eosinophils Absolute: 0 10*3/uL (ref 0.0–0.5)
Eosinophils Relative: 0 %
HCT: 38.8 % (ref 36.0–46.0)
Hemoglobin: 13.2 g/dL (ref 12.0–15.0)
Immature Granulocytes: 0 %
Lymphocytes Relative: 29 %
Lymphs Abs: 2.1 10*3/uL (ref 0.7–4.0)
MCH: 37.6 pg — ABNORMAL HIGH (ref 26.0–34.0)
MCHC: 34 g/dL (ref 30.0–36.0)
MCV: 110.5 fL — ABNORMAL HIGH (ref 80.0–100.0)
Monocytes Absolute: 0.6 10*3/uL (ref 0.1–1.0)
Monocytes Relative: 8 %
Neutro Abs: 4.4 10*3/uL (ref 1.7–7.7)
Neutrophils Relative %: 63 %
Platelets: 279 10*3/uL (ref 150–400)
RBC: 3.51 MIL/uL — ABNORMAL LOW (ref 3.87–5.11)
RDW: 13.3 % (ref 11.5–15.5)
WBC: 7.1 10*3/uL (ref 4.0–10.5)
nRBC: 0 % (ref 0.0–0.2)

## 2022-01-16 LAB — LACTATE DEHYDROGENASE: LDH: 164 U/L (ref 98–192)

## 2022-01-16 NOTE — Progress Notes (Signed)
[address] [place], [place] [address]   CLINIC:  Medical Oncology/Hematology  PCP:  Sharilyn Sites, [place] / [place] [address]  [phone]  REASON FOR VISIT:  Follow-up for JAK2 positive polycythemia  PRIOR THERAPY: none  CURRENT THERAPY: hydrea  INTERVAL HISTORY:  Denise Maynard, a 66 y.o. female, returns for routine follow-up for her JAK2 positive polycythemia. Denise Maynard was last seen on 09/19/2021.  Today she reports feeling good. She denies nausea, vomiting, and skin breakdown on the legs of hands. She reports occasional itching on her back for a few minutes following showering. She denies changing colors of fingertips. She denies ankle swellings and infections.   REVIEW OF SYSTEMS:  Review of Systems  Constitutional:  Negative for appetite change and fatigue.  Cardiovascular:  Negative for leg swelling.  Gastrointestinal:  Negative for nausea and vomiting.  Psychiatric/Behavioral:  Positive for sleep disturbance.   All other systems reviewed and are negative.   PAST MEDICAL/SURGICAL HISTORY:  Past Medical History:  Diagnosis Date   Carotid artery aneurysm (HCC)    Coil embolization of a supraclinoid right internal carotid artery - Dr. Kathyrn Sheriff   Hyperlipidemia    Nicotine addiction 05/15/2015   Will try patch   Serum calcium elevated 11/19/2017   recheck   Serum potassium elevated 11/19/2017   Stroke [facility])    Past Surgical History:  Procedure Laterality Date   ABDOMINAL AORTAGRAM N/A 11/24/2014   Procedure: ABDOMINAL Maxcine Ham;  Surgeon: Elam Dutch, MD;  Location: [facility] CATH LAB;  Service: Cardiovascular;  Laterality: N/A;   Aneursym repair  10/2014   COLONOSCOPY N/A 07/30/2015   Procedure: COLONOSCOPY;  Surgeon: Danie Binder, MD;  [facility];  Service: Endoscopy;  Laterality: N/A;  1:00 Pm - moved to 1:30 - office to notify   FRACTURE SURGERY     Jaw   LOWER EXTREMITY ANGIOGRAM  11/24/2014    Procedure: LOWER EXTREMITY ANGIOGRAM;  Surgeon: Elam Dutch, MD;  Location: [facility] CATH LAB;  Service: Cardiovascular;;   MOUTH SURGERY     RADIOLOGY WITH ANESTHESIA N/A 10/12/2014   Procedure: Embolization/arteriogram;  Surgeon: Consuella Lose, MD;  Location: [place];  Service: Radiology;  Laterality: N/A;    SOCIAL HISTORY:  Social History   Socioeconomic History   Marital status: Divorced    Spouse name: Not on file   Number of children: Not on file   Years of education: Not on file   Highest education level: Not on file  Occupational History   Not on file  Tobacco Use   Smoking status: Every Day    Packs/day: 0.50    Years: 30.00    Total pack years: 15.00    Types: Cigarettes   Smokeless tobacco: Never  Substance and Sexual Activity   Alcohol use: No    Alcohol/week: 0.0 standard drinks of alcohol   Drug use: No   Sexual activity: Yes    Birth control/protection: Post-menopausal  Other Topics Concern   Not on file  Social History Narrative   Not on file   Social Determinants of Health   Financial Resource Strain: Not on file  Food Insecurity: Not on file  Transportation Needs: Not on file  Physical Activity: Not on file  Stress: Not on file  Social Connections: Not on file  Intimate Partner Violence: Not on file    FAMILY HISTORY:  Family History  Problem Relation Age of Onset   Heart attack  Mother    Diabetes Mother    Throat cancer Father    Hypertension Father    Hyperlipidemia Sister    Diabetes Maternal Grandmother    Cancer Maternal Grandfather    Heart attack Paternal Grandfather    Hyperlipidemia Sister     CURRENT MEDICATIONS:  Current Outpatient Medications  Medication Sig Dispense Refill   acetaminophen (TYLENOL) 500 MG tablet Take 1,000 mg by mouth every 6 (six) hours as needed for mild pain or moderate pain.     ALPRAZolam (XANAX) 0.5 MG tablet Take 1 tablet (0.5 mg total) by mouth at bedtime. 30 tablet 0   Artificial Tear Ointment  (DRY EYES OP) Apply to eye daily.      aspirin EC 81 MG tablet Take 81 mg by mouth daily.     cholecalciferol (VITAMIN D) 1000 UNITS tablet Take 4,000 Units by mouth daily.     fluocinonide gel (LIDEX) 0.05 % Apply topically.     hydroxyurea (HYDREA) 500 MG capsule Take 2 capsules (1,000 mg total) by mouth daily. Take 2 capsules Monday Wednesday Friday and take 1 every other day of the week.may take with food to minimize GI side effects. 120 capsule 5   Multiple Vitamins-Minerals (MULTIVITAMIN WITH MINERALS) tablet Take 1 tablet by mouth daily.     Omega-3 Fatty Acids (FISH OIL) 500 MG CAPS Take 500 mg by mouth once.      No current facility-administered medications for this visit.    ALLERGIES:  No Known Allergies  PHYSICAL EXAM:  Performance status (ECOG): 1 - Symptomatic but completely ambulatory  There were no vitals filed for this visit. Wt Readings from Last 3 Encounters:  09/19/21 142 lb 8 oz (64.6 kg)  05/14/21 143 lb 9.6 oz (65.1 kg)  01/08/21 145 lb 8 oz (66 kg)   Physical Exam Vitals reviewed.  Constitutional:      Appearance: Normal appearance.  Cardiovascular:     Rate and Rhythm: Normal rate and regular rhythm.     Pulses: Normal pulses.     Heart sounds: Normal heart sounds.  Pulmonary:     Effort: Pulmonary effort is normal.     Breath sounds: Normal breath sounds.  Neurological:     General: No focal deficit present.     Mental Status: She is alert and oriented to person, place, and time.  Psychiatric:        Mood and Affect: Mood normal.        Behavior: Behavior normal.     LABORATORY DATA:  I have reviewed the labs as listed.     Latest Ref Rng & Units 01/16/2022    1:28 PM 09/19/2021    2:14 PM 05/07/2021    3:05 PM  CBC  WBC 4.0 - 10.5 K/uL 7.1  5.0  6.3   Hemoglobin 12.0 - 15.0 g/dL 13.2  12.0  12.7   Hematocrit 36.0 - 46.0 % 38.8  35.8  37.1   Platelets 150 - 400 K/uL 279  214  275       Latest Ref Rng & Units 09/19/2021    2:14 PM 05/07/2021     3:05 PM 01/03/2021    8:16 AM  CMP  Glucose 70 - 99 mg/dL 111  98  95   BUN 8 - 23 mg/dL '13  11  11   '$ Creatinine 0.44 - 1.00 mg/dL 0.71  0.79  0.60   Sodium 135 - 145 mmol/L 136  136  138   Potassium  3.5 - 5.1 mmol/L 3.4  3.7  4.9   Chloride 98 - 111 mmol/L 101  103  105   CO2 22 - 32 mmol/L '27  25  27   '$ Calcium 8.9 - 10.3 mg/dL 9.5  9.8  9.4   Total Protein 6.5 - 8.1 g/dL 7.4  8.1  7.6   Total Bilirubin 0.3 - 1.2 mg/dL 0.3  0.4  0.7   Alkaline Phos 38 - 126 U/L 70  80  75   AST 15 - 41 U/L '18  21  19   '$ ALT 0 - 44 U/L '16  17  15       '$ Component Value Date/Time   RBC 3.51 (L) 01/16/2022 1328   MCV 110.5 (H) 01/16/2022 1328   MCV 95 11/30/2017 0841   MCH 37.6 (H) 01/16/2022 1328   MCHC 34.0 01/16/2022 1328   RDW 13.3 01/16/2022 1328   RDW 15.7 (H) 11/30/2017 0841   LYMPHSABS PENDING 01/16/2022 1328   LYMPHSABS 2.2 11/30/2017 0841   MONOABS PENDING 01/16/2022 1328   EOSABS PENDING 01/16/2022 1328   EOSABS 0.2 11/30/2017 0841   BASOSABS PENDING 01/16/2022 1328   BASOSABS 0.1 11/30/2017 0841    DIAGNOSTIC IMAGING:  I have independently reviewed the scans and discussed with the patient. No results found.   ASSESSMENT:  1.  [name]: - She had a bone marrow aspiration and biopsy on 01/11/2018 shows hypercellular (50 to 70% cellularity) marrow with trilineage hematopoiesis.  Reticulin stain showed mild increase in reticulin fibers.  Chromosome analysis shows 56, XX. - No prior history of vasomotor symptoms or thrombosis.  Given her age more than 40, she was recommended to start on hydroxyurea. - She takes hydroxyurea 500 mg daily started on 12/26/2017, increased to 2 tablets daily on 02/07/2018. -On 07/22/2018, her platelet count dropped to 72.  Hence we will cut back the dose of Hydrea to 2 tablets on Monday Wednesdays and Fridays and 1 tablet on the other days. - Hydroxyurea dose reduced to 1 tablet daily on 09/19/2021.   2.  Sleeplessness: - She is taking Xanax  0.25 mg as needed for sleep which is helping.   3.  Lung nodules: -Patient has a longtime smoking history. -CT of the chest on 03/25/2019 showed multiple small pulmonary nodules are again noted in the lungs bilaterally, largest of which is in the posterior aspect of the right upper lobe near the apex.  -CT of the chest on 04/02/2020 showed lung RADS 2 follow-up in 1 year   PLAN:  1.  [name]: - She has mild aquagenic pruritus on the upper back lasting few minutes after shower. - At last visit I have ordered cut back on the dose of Hydrea to 1 tablet daily. - She is tolerating it very well.  No erythromelalgia's.  No GI symptoms. - Reviewed labs today which showed white count is 7.1.  Hematocrit is 38 and platelet count is 279. - Continue Hydrea 1 tablet daily.  If she continues to have blood counts in the same range at next visit, will consider further decreasing the dose of hydroxyurea to 5 days a week.  RTC 4 months with repeat labs.     2.  Sleeplessness: - Continue Xanax as needed for sleep.     3.  Smoking history: - CT low-dose screening scan on 05/07/2021 was lung RADS 2. - I will schedule another low-dose chest CT on 05/07/2022.  Orders placed this encounter:  No orders of the defined types were placed in this encounter.    Derek Jack, MD [place] [phone]   I, Thana Ates, am acting as a scribe for Dr. Derek Jack.  I, Derek Jack MD, have reviewed the above documentation for accuracy and completeness, and I agree with the above.

## 2022-01-16 NOTE — Patient Instructions (Addendum)
[place] at [facility] Discharge Instructions  You were seen and examined today by Dr. Delton Coombes.  Dr. Delton Coombes discussed your most recent lab work and everything looks good.   We will get your scheduled for CT of your chest.   Follow-up as scheduled in 4 months.    Thank you for choosing [place] at [facility] to provide your oncology and hematology care.  To afford each patient quality time with our provider, please arrive at least 15 minutes before your scheduled appointment time.   If you have a lab appointment with the [facility] please come in thru the Main Entrance and check in at the main information desk.  You need to re-schedule your appointment should you arrive 10 or more minutes late.  We strive to give you quality time with our providers, and arriving late affects you and other patients whose appointments are after yours.  Also, if you no show three or more times for appointments you may be dismissed from the clinic at the providers discretion.     Again, thank you for choosing [facility].  Our hope is that these requests will decrease the amount of time that you wait before being seen by our physicians.       _____________________________________________________________  Should you have questions after your visit to [facility], please contact our office at [phone] and follow the prompts.  Our office hours are 8:00 a.m. and 4:30 p.m. Monday - Friday.  Please note that voicemails left after 4:00 p.m. may not be returned until the following business day.  We are closed weekends and major holidays.  You do have access to a nurse 24-7, just call the main number to the clinic [phone] and do not press any options, hold on the line and a nurse will answer the phone.    For prescription refill requests, have your pharmacy contact our office and allow 72 hours.    Due to Covid, you will  need to wear a mask upon entering the hospital. If you do not have a mask, a mask will be given to you at the Main Entrance upon arrival. For doctor visits, patients may have 1 support person age 60 or older with them. For treatment visits, patients can not have anyone with them due to social distancing guidelines and our immunocompromised population.

## 2022-02-24 DIAGNOSIS — E785 Hyperlipidemia, unspecified: Secondary | ICD-10-CM | POA: Diagnosis not present

## 2022-02-24 DIAGNOSIS — Z6824 Body mass index (BMI) 24.0-24.9, adult: Secondary | ICD-10-CM | POA: Diagnosis not present

## 2022-02-24 DIAGNOSIS — D45 Polycythemia vera: Secondary | ICD-10-CM | POA: Diagnosis not present

## 2022-02-24 DIAGNOSIS — Z0001 Encounter for general adult medical examination with abnormal findings: Secondary | ICD-10-CM | POA: Diagnosis not present

## 2022-02-24 DIAGNOSIS — F17200 Nicotine dependence, unspecified, uncomplicated: Secondary | ICD-10-CM | POA: Diagnosis not present

## 2022-02-24 DIAGNOSIS — I671 Cerebral aneurysm, nonruptured: Secondary | ICD-10-CM | POA: Diagnosis not present

## 2022-03-10 ENCOUNTER — Other Ambulatory Visit: Payer: Self-pay | Admitting: Hematology

## 2022-03-10 DIAGNOSIS — Z1589 Genetic susceptibility to other disease: Secondary | ICD-10-CM

## 2022-03-11 ENCOUNTER — Other Ambulatory Visit: Payer: Self-pay | Admitting: *Deleted

## 2022-03-11 NOTE — Telephone Encounter (Signed)
Hydrea refill approved.  Per last ovn, patient is tolerating and is to continue therapy.

## 2022-05-08 ENCOUNTER — Ambulatory Visit (HOSPITAL_COMMUNITY)
Admission: RE | Admit: 2022-05-08 | Discharge: 2022-05-08 | Disposition: A | Discharge status: Home / Self Care | Payer: Medicare Other | Source: Ambulatory Visit | Attending: Hematology | Admitting: Hematology

## 2022-05-08 DIAGNOSIS — I700 Atherosclerosis of aorta: Secondary | ICD-10-CM | POA: Insufficient documentation

## 2022-05-08 DIAGNOSIS — D3501 Benign neoplasm of right adrenal gland: Secondary | ICD-10-CM | POA: Insufficient documentation

## 2022-05-08 DIAGNOSIS — Z1589 Genetic susceptibility to other disease: Secondary | ICD-10-CM | POA: Diagnosis present

## 2022-05-08 DIAGNOSIS — F17210 Nicotine dependence, cigarettes, uncomplicated: Secondary | ICD-10-CM | POA: Insufficient documentation

## 2022-05-08 DIAGNOSIS — I2510 Atherosclerotic heart disease of native coronary artery without angina pectoris: Secondary | ICD-10-CM | POA: Insufficient documentation

## 2022-05-08 DIAGNOSIS — D471 Chronic myeloproliferative disease: Secondary | ICD-10-CM | POA: Insufficient documentation

## 2022-05-08 DIAGNOSIS — J439 Emphysema, unspecified: Secondary | ICD-10-CM | POA: Diagnosis not present

## 2022-05-08 DIAGNOSIS — Z122 Encounter for screening for malignant neoplasm of respiratory organs: Secondary | ICD-10-CM | POA: Diagnosis not present

## 2022-05-22 ENCOUNTER — Inpatient Hospital Stay: Payer: Medicare Other | Attending: Hematology

## 2022-05-22 ENCOUNTER — Inpatient Hospital Stay: Payer: Medicare Other | Admitting: Hematology

## 2022-05-22 VITALS — BP 136/71 | HR 61 | Temp 98.9°F | Resp 17 | Ht 65.0 in | Wt 140.7 lb

## 2022-05-22 DIAGNOSIS — D471 Chronic myeloproliferative disease: Secondary | ICD-10-CM

## 2022-05-22 DIAGNOSIS — R918 Other nonspecific abnormal finding of lung field: Secondary | ICD-10-CM | POA: Diagnosis not present

## 2022-05-22 DIAGNOSIS — Z1589 Genetic susceptibility to other disease: Secondary | ICD-10-CM | POA: Diagnosis not present

## 2022-05-22 DIAGNOSIS — D45 Polycythemia vera: Secondary | ICD-10-CM | POA: Insufficient documentation

## 2022-05-22 DIAGNOSIS — D75839 Thrombocytosis, unspecified: Secondary | ICD-10-CM | POA: Diagnosis not present

## 2022-05-22 DIAGNOSIS — G4700 Insomnia, unspecified: Secondary | ICD-10-CM | POA: Diagnosis not present

## 2022-05-22 LAB — COMPREHENSIVE METABOLIC PANEL
ALT: 19 U/L (ref 0–44)
AST: 23 U/L (ref 15–41)
Albumin: 4.2 g/dL (ref 3.5–5.0)
Alkaline Phosphatase: 71 U/L (ref 38–126)
Anion gap: 7 (ref 5–15)
BUN: 10 mg/dL (ref 8–23)
CO2: 27 mmol/L (ref 22–32)
Calcium: 9.4 mg/dL (ref 8.9–10.3)
Chloride: 105 mmol/L (ref 98–111)
Creatinine, Ser: 0.66 mg/dL (ref 0.44–1.00)
GFR, Estimated: >60 mL/min (ref >60)
Glucose, Bld: 142 mg/dL — ABNORMAL HIGH (ref 70–99)
Potassium: 3.7 mmol/L (ref 3.5–5.1)
Sodium: 139 mmol/L (ref 135–145)
Total Bilirubin: 0.6 mg/dL (ref 0.3–1.2)
Total Protein: 7.3 g/dL (ref 6.5–8.1)

## 2022-05-22 LAB — CBC WITH DIFFERENTIAL/PLATELET
Abs Immature Granulocytes: 0.01 10*3/uL (ref 0.00–0.07)
Basophils Absolute: 0 10*3/uL (ref 0.0–0.1)
Basophils Relative: 0 %
Eosinophils Absolute: 0 10*3/uL (ref 0.0–0.5)
Eosinophils Relative: 1 %
HCT: 37 % (ref 36.0–46.0)
Hemoglobin: 12.5 g/dL (ref 12.0–15.0)
Immature Granulocytes: 0 %
Lymphocytes Relative: 33 %
Lymphs Abs: 2 10*3/uL (ref 0.7–4.0)
MCH: 37.4 pg — ABNORMAL HIGH (ref 26.0–34.0)
MCHC: 33.8 g/dL (ref 30.0–36.0)
MCV: 110.8 fL — ABNORMAL HIGH (ref 80.0–100.0)
Monocytes Absolute: 0.4 10*3/uL (ref 0.1–1.0)
Monocytes Relative: 7 %
Neutro Abs: 3.6 10*3/uL (ref 1.7–7.7)
Neutrophils Relative %: 59 %
Platelets: 246 10*3/uL (ref 150–400)
RBC: 3.34 MIL/uL — ABNORMAL LOW (ref 3.87–5.11)
RDW: 13.3 % (ref 11.5–15.5)
WBC: 6.1 10*3/uL (ref 4.0–10.5)
nRBC: 0 % (ref 0.0–0.2)

## 2022-05-22 LAB — LACTATE DEHYDROGENASE: LDH: 160 U/L (ref 98–192)

## 2022-05-22 NOTE — Patient Instructions (Signed)
[facility]  Discharge Instructions  You were seen and examined today by Dr. Delton Coombes.  Dr. Delton Coombes discussed your most recent lab work and CT scan which revealed that everything looks okay.  Cut Hydrea back to Monday- Friday and hold on the weekends.   Follow-up as scheduled in 4 months with labs.    Thank you for choosing [facility] to provide your oncology and hematology care.   To afford each patient quality time with our provider, please arrive at least 15 minutes before your scheduled appointment time. You may need to reschedule your appointment if you arrive late (10 or more minutes). Arriving late affects you and other patients whose appointments are after yours.  Also, if you miss three or more appointments without notifying the office, you may be dismissed from the clinic at the provider's discretion.    Again, thank you for choosing [facility].  Our hope is that these requests will decrease the amount of time that you wait before being seen by our physicians.   If you have a lab appointment with the [facility] please come in thru the Main Entrance and check in at the main information desk.           _____________________________________________________________  Should you have questions after your visit to [facility], please contact our office at [phone] and follow the prompts.  Our office hours are 8:00 a.m. to 4:30 p.m. Monday - Thursday and 8:00 a.m. to 2:30 p.m. Friday.  Please note that voicemails left after 4:00 p.m. may not be returned until the following business day.  We are closed weekends and all major holidays.  You do have access to a nurse 24-7, just call the main number to the clinic [phone] and do not press any options, hold on the line and a nurse will answer the phone.    For prescription refill requests, have your pharmacy contact our office and allow 72  hours.    Masks are optional in the cancer centers. If you would like for your care team to wear a mask while they are taking care of you, please let them know. You may have one support person who is at least 68 years old accompany you for your appointments.

## 2022-05-22 NOTE — Progress Notes (Signed)
[place], [place] [address]   CLINIC:  Medical Oncology/Hematology  PCP:  Denise Maynard, [place] / [place] [address]  [phone]  REASON FOR VISIT:  Follow-up for JAK2 positive polycythemia  PRIOR THERAPY: none  CURRENT THERAPY: hydrea  INTERVAL HISTORY:  Denise Maynard, a 66 y.o. female, seen for follow-up of myeloproliferative neoplasm.  She is tolerating hydroxyurea 1 tablet daily very well.  Reports some dyspnea on exertion.  Reports right-sided back pain 6 out of 10 in intensity.  Energy levels are 75%.  Has some itching on the upper back after taking shower, lasting less than 5 minutes.  No vasomotor symptoms.  REVIEW OF SYSTEMS:  Review of Systems  Constitutional:  Negative for appetite change and fatigue.  Respiratory:  Positive for shortness of breath (exretion).   Cardiovascular:  Negative for leg swelling.  Gastrointestinal:  Negative for nausea and vomiting.  Musculoskeletal:  Positive for back pain.  Psychiatric/Behavioral:  Positive for sleep disturbance.   All other systems reviewed and are negative.   PAST MEDICAL/SURGICAL HISTORY:  Past Medical History:  Diagnosis Date   Carotid artery aneurysm (HCC)    Coil embolization of a supraclinoid right internal carotid artery - Dr. Kathyrn Sheriff   Hyperlipidemia    Nicotine addiction 05/15/2015   Will try patch   Serum calcium elevated 11/19/2017   recheck   Serum potassium elevated 11/19/2017   Stroke [facility])    Past Surgical History:  Procedure Laterality Date   ABDOMINAL AORTAGRAM N/A 11/24/2014   Procedure: ABDOMINAL Maxcine Ham;  Surgeon: Elam Dutch, MD;  Location: [facility] CATH LAB;  Service: Cardiovascular;  Laterality: N/A;   Aneursym repair  10/2014   COLONOSCOPY N/A 07/30/2015   Procedure: COLONOSCOPY;  Surgeon: Danie Binder, MD;  Location: [facility] ENDO SUITE;  Service: Endoscopy;  Laterality: N/A;  1:00 Pm - moved to 1:30 - office to notify    FRACTURE SURGERY     Jaw   LOWER EXTREMITY ANGIOGRAM  11/24/2014   Procedure: LOWER EXTREMITY ANGIOGRAM;  Surgeon: Elam Dutch, MD;  Location: [facility] CATH LAB;  Service: Cardiovascular;;   MOUTH SURGERY     RADIOLOGY WITH ANESTHESIA N/A 10/12/2014   Procedure: Embolization/arteriogram;  Surgeon: Consuella Lose, MD;  Location: [facility];  Service: Radiology;  Laterality: N/A;    SOCIAL HISTORY:  Social History   Socioeconomic History   Marital status: Divorced    Spouse name: Not on file   Number of children: Not on file   Years of education: Not on file   Highest education level: Not on file  Occupational History   Not on file  Tobacco Use   Smoking status: Every Day    Packs/day: 0.50    Years: 30.00    Total pack years: 15.00    Types: Cigarettes   Smokeless tobacco: Never  Substance and Sexual Activity   Alcohol use: No    Alcohol/week: 0.0 standard drinks of alcohol   Drug use: No   Sexual activity: Yes    Birth control/protection: Post-menopausal  Other Topics Concern   Not on file  Social History Narrative   Not on file   Social Determinants of Health   Financial Resource Strain: Not on file  Food Insecurity: Not on file  Transportation Needs: Not on file  Physical Activity: Not on file  Stress: Not on file  Social Connections: Not on file  Intimate Partner Violence: Not on file  FAMILY HISTORY:  Family History  Problem Relation Age of Onset   Heart attack Mother    Diabetes Mother    Throat cancer Father    Hypertension Father    Hyperlipidemia Sister    Diabetes Maternal Grandmother    Cancer Maternal Grandfather    Heart attack Paternal Grandfather    Hyperlipidemia Sister     CURRENT MEDICATIONS:  Current Outpatient Medications  Medication Sig Dispense Refill   acetaminophen (TYLENOL) 500 MG tablet Take 1,000 mg by mouth every 6 (six) hours as needed for mild pain or moderate pain.     ALPRAZolam (XANAX) 0.5 MG tablet Take 1 tablet (0.5 mg  total) by mouth at bedtime. 30 tablet 0   Artificial Tear Ointment (DRY EYES OP) Apply to eye daily.      aspirin EC 81 MG tablet Take 81 mg by mouth daily.     cholecalciferol (VITAMIN D) 1000 UNITS tablet Take 4,000 Units by mouth daily.     hydroxyurea (HYDREA) 500 MG capsule TAKE 2 CAPSULES BY  MOUTH ON MONDAY, WEDNESDAY, FRIDAY AND 1 CAPSULE ALL  OTHER DAYS 145 capsule 0   Multiple Vitamins-Minerals (MULTIVITAMIN WITH MINERALS) tablet Take 1 tablet by mouth daily.     Omega-3 Fatty Acids (FISH OIL) 500 MG CAPS Take 500 mg by mouth once.      rosuvastatin (CRESTOR) 5 MG tablet Take 5 mg by mouth daily.     No current facility-administered medications for this visit.    ALLERGIES:  No Known Allergies  PHYSICAL EXAM:  Performance status (ECOG): 1 - Symptomatic but completely ambulatory  Vitals:   05/22/22 1530  BP: 136/71  Pulse: 61  Resp: 17  Temp: 98.9 F (37.2 C)  SpO2: 100%   Wt Readings from Last 3 Encounters:  05/22/22 140 lb 11.2 oz (63.8 kg)  01/16/22 142 lb 4.8 oz (64.5 kg)  09/19/21 142 lb 8 oz (64.6 kg)   Physical Exam Vitals reviewed.  Constitutional:      Appearance: Normal appearance.  Cardiovascular:     Rate and Rhythm: Normal rate and regular rhythm.     Pulses: Normal pulses.     Heart sounds: Normal heart sounds.  Pulmonary:     Effort: Pulmonary effort is normal.     Breath sounds: Normal breath sounds.  Neurological:     General: No focal deficit present.     Mental Status: She is alert and oriented to person, place, and time.  Psychiatric:        Mood and Affect: Mood normal.        Behavior: Behavior normal.     LABORATORY DATA:  I have reviewed the labs as listed.     Latest Ref Rng & Units 05/22/2022    2:09 PM 01/16/2022    1:28 PM 09/19/2021    2:14 PM  CBC  WBC 4.0 - 10.5 K/uL 6.1  7.1  5.0   Hemoglobin 12.0 - 15.0 g/dL 12.5  13.2  12.0   Hematocrit 36.0 - 46.0 % 37.0  38.8  35.8   Platelets 150 - 400 K/uL 246  279  214        Latest Ref Rng & Units 05/22/2022    2:09 PM 01/16/2022    1:28 PM 09/19/2021    2:14 PM  CMP  Glucose 70 - 99 mg/dL 142  153  111   BUN 8 - 23 mg/dL '10  12  13   '$ Creatinine 0.44 - 1.00  mg/dL 0.66  0.75  0.71   Sodium 135 - 145 mmol/L 139  139  136   Potassium 3.5 - 5.1 mmol/L 3.7  3.6  3.4   Chloride 98 - 111 mmol/L 105  103  101   CO2 22 - 32 mmol/L '27  26  27   '$ Calcium 8.9 - 10.3 mg/dL 9.4  9.4  9.5   Total Protein 6.5 - 8.1 g/dL 7.3  7.4  7.4   Total Bilirubin 0.3 - 1.2 mg/dL 0.6  0.5  0.3   Alkaline Phos 38 - 126 U/L 71  77  70   AST 15 - 41 U/L '23  22  18   '$ ALT 0 - 44 U/L '19  17  16       '$ Component Value Date/Time   RBC 3.34 (L) 05/22/2022 1409   MCV 110.8 (H) 05/22/2022 1409   MCV 95 11/30/2017 0841   MCH 37.4 (H) 05/22/2022 1409   MCHC 33.8 05/22/2022 1409   RDW 13.3 05/22/2022 1409   RDW 15.7 (H) 11/30/2017 0841   LYMPHSABS 2.0 05/22/2022 1409   LYMPHSABS 2.2 11/30/2017 0841   MONOABS 0.4 05/22/2022 1409   EOSABS 0.0 05/22/2022 1409   EOSABS 0.2 11/30/2017 0841   BASOSABS 0.0 05/22/2022 1409   BASOSABS 0.1 11/30/2017 0841    DIAGNOSTIC IMAGING:  I have independently reviewed the scans and discussed with the patient. CT CHEST LUNG CANCER SCREENING LOW DOSE WO CONTRAST  Result Date: 05/09/2022 CLINICAL DATA:  Current smoker, 38 pack-year history. EXAM: CT CHEST WITHOUT CONTRAST LOW-DOSE FOR LUNG CANCER SCREENING TECHNIQUE: Multidetector CT imaging of the chest was performed following the standard protocol without IV contrast. RADIATION DOSE REDUCTION: This exam was performed according to the departmental dose-optimization program which includes automated exposure control, adjustment of the mA and/or kV according to patient size and/or use of iterative reconstruction technique. COMPARISON:  05/07/2021. FINDINGS: Cardiovascular: Atherosclerotic calcification of the aorta, aortic valve and coronary arteries. Heart size normal. No pericardial effusion. Mediastinum/Nodes: No  pathologically enlarged mediastinal or axillary lymph nodes. Hilar regions are difficult to definitively evaluate without IV contrast. Esophagus is grossly unremarkable. Lungs/Pleura: Biapical pleuroparenchymal scarring. Paraseptal emphysema. Pulmonary nodules measure 9.8 mm or less in size, as before. No new or suspicious pulmonary nodules. No pleural fluid. Airway is unremarkable. Upper Abdomen: Visualized portion of the liver is unremarkable. 1.9 cm nodule in the right adrenal gland measures -10 Hounsfield units. No follow-up necessary. Adrenal glands, kidneys, spleen, pancreas, stomach and bowel are otherwise grossly unremarkable. Left hemidiaphragm is elevated. Musculoskeletal: Degenerative changes in the spine. No worrisome lytic or sclerotic lesions. IMPRESSION: 1. Lung-RADS 2, benign appearance or behavior. Continue annual screening with low-dose chest CT without contrast in 12 months. 2. Right adrenal adenoma. 3. Aortic atherosclerosis (ICD10-I70.0). Coronary artery calcification. 4.  Emphysema (ICD10-J43.9). Electronically Signed   By: Lorin Picket M.D.   On: 05/09/2022 10:58     ASSESSMENT:  1.  [name]: - She had a bone marrow aspiration and biopsy on 01/11/2018 shows hypercellular (50 to 70% cellularity) marrow with trilineage hematopoiesis.  Reticulin stain showed mild increase in reticulin fibers.  Chromosome analysis shows 49, XX. - No prior history of vasomotor symptoms or thrombosis.  Given her age more than 2, she was recommended to start on hydroxyurea. - She takes hydroxyurea 500 mg daily started on 12/26/2017, increased to 2 tablets daily on 02/07/2018. -On 07/22/2018, her platelet count dropped to 72.  Hence we will cut  back the dose of Hydrea to 2 tablets on Monday Wednesdays and Fridays and 1 tablet on the other days. - Hydroxyurea dose reduced to 1 tablet daily on 09/19/2021.   2.  Sleeplessness: - She is taking Xanax 0.25 mg as needed for sleep which is helping.    3.  Lung nodules: -Patient has a longtime smoking history. -CT of the chest on 03/25/2019 showed multiple small pulmonary nodules are again noted in the lungs bilaterally, largest of which is in the posterior aspect of the right upper lobe near the apex.  -CT of the chest on 04/02/2020 showed lung RADS 2 follow-up in 1 year   PLAN:  1.  [name]: - She is taking hydroxyurea 1 tablet daily and tolerating well. - She has mild aquagenic pruritus on the upper back lasting less than 5 minutes.  No other vasomotor symptoms. - Reviewed labs which shows hematocrit 37, PLT-246 and normal white count.  LFTs and LDH are normal. - Recommend decreasing hydroxyurea to 1 tablet Monday through Friday.  RTC 4 months with repeat labs.     2.  Sleeplessness: - Continue Xanax as needed for sleep.   3.  Smoking history: - CT chest lung cancer screening scan from 05/08/2022 reviewed by me shows lung RADS 2. - Right adrenal adenoma finding was discussed.  Orders placed this encounter:  No orders of the defined types were placed in this encounter.    Derek Jack, MD [place] [phone]

## 2022-09-15 ENCOUNTER — Other Ambulatory Visit: Payer: Medicare Other

## 2022-09-22 ENCOUNTER — Inpatient Hospital Stay: Payer: Medicare Other

## 2022-09-22 ENCOUNTER — Inpatient Hospital Stay: Payer: Medicare Other | Attending: Hematology | Admitting: Hematology

## 2022-09-22 VITALS — BP 166/77 | HR 64 | Temp 98.5°F | Resp 17 | Ht 65.0 in | Wt 146.0 lb

## 2022-09-22 DIAGNOSIS — R918 Other nonspecific abnormal finding of lung field: Secondary | ICD-10-CM | POA: Diagnosis not present

## 2022-09-22 DIAGNOSIS — D75839 Thrombocytosis, unspecified: Secondary | ICD-10-CM | POA: Diagnosis not present

## 2022-09-22 DIAGNOSIS — G4700 Insomnia, unspecified: Secondary | ICD-10-CM | POA: Insufficient documentation

## 2022-09-22 DIAGNOSIS — D45 Polycythemia vera: Secondary | ICD-10-CM | POA: Diagnosis not present

## 2022-09-22 DIAGNOSIS — F17210 Nicotine dependence, cigarettes, uncomplicated: Secondary | ICD-10-CM | POA: Insufficient documentation

## 2022-09-22 DIAGNOSIS — D471 Chronic myeloproliferative disease: Secondary | ICD-10-CM

## 2022-09-22 DIAGNOSIS — Z808 Family history of malignant neoplasm of other organs or systems: Secondary | ICD-10-CM | POA: Diagnosis not present

## 2022-09-22 DIAGNOSIS — Z1589 Genetic susceptibility to other disease: Secondary | ICD-10-CM

## 2022-09-22 LAB — COMPREHENSIVE METABOLIC PANEL
ALT: 20 U/L (ref 0–44)
AST: 26 U/L (ref 15–41)
Albumin: 4.1 g/dL (ref 3.5–5.0)
Alkaline Phosphatase: 68 U/L (ref 38–126)
Anion gap: 9 (ref 5–15)
BUN: 10 mg/dL (ref 8–23)
CO2: 27 mmol/L (ref 22–32)
Calcium: 9.6 mg/dL (ref 8.9–10.3)
Chloride: 105 mmol/L (ref 98–111)
Creatinine, Ser: 0.79 mg/dL (ref 0.44–1.00)
GFR, Estimated: >60 mL/min (ref >60)
Glucose, Bld: 115 mg/dL — ABNORMAL HIGH (ref 70–99)
Potassium: 4.3 mmol/L (ref 3.5–5.1)
Sodium: 141 mmol/L (ref 135–145)
Total Bilirubin: 0.6 mg/dL (ref 0.3–1.2)
Total Protein: 7.3 g/dL (ref 6.5–8.1)

## 2022-09-22 LAB — CBC WITH DIFFERENTIAL/PLATELET
Abs Immature Granulocytes: 0.03 10*3/uL (ref 0.00–0.07)
Basophils Absolute: 0 10*3/uL (ref 0.0–0.1)
Basophils Relative: 0 %
Eosinophils Absolute: 0 10*3/uL (ref 0.0–0.5)
Eosinophils Relative: 0 %
HCT: 39.8 % (ref 36.0–46.0)
Hemoglobin: 13.3 g/dL (ref 12.0–15.0)
Immature Granulocytes: 0 %
Lymphocytes Relative: 26 %
Lymphs Abs: 2.2 10*3/uL (ref 0.7–4.0)
MCH: 35.8 pg — ABNORMAL HIGH (ref 26.0–34.0)
MCHC: 33.4 g/dL (ref 30.0–36.0)
MCV: 107.3 fL — ABNORMAL HIGH (ref 80.0–100.0)
Monocytes Absolute: 0.7 10*3/uL (ref 0.1–1.0)
Monocytes Relative: 8 %
Neutro Abs: 5.5 10*3/uL (ref 1.7–7.7)
Neutrophils Relative %: 66 %
Platelets: 297 10*3/uL (ref 150–400)
RBC: 3.71 MIL/uL — ABNORMAL LOW (ref 3.87–5.11)
RDW: 13.6 % (ref 11.5–15.5)
WBC: 8.4 10*3/uL (ref 4.0–10.5)
nRBC: 0 % (ref 0.0–0.2)

## 2022-09-22 LAB — LACTATE DEHYDROGENASE: LDH: 175 U/L (ref 98–192)

## 2022-09-22 NOTE — Progress Notes (Signed)
[facility] [address], [place] [address]    Clinic Day:  09/22/2022  Referring physician: Sharilyn Sites, MD  Patient Care Team: Sharilyn Sites, MD as PCP - General (Family Medicine) Eloise Harman, DO as Consulting Physician (Internal Medicine)   ASSESSMENT & PLAN:   Assessment: 1.Denise Maynard: - BMBX on 01/11/2018 shows hypercellular (50 to 70% cellularity) marrow with trilineage hematopoiesis.  Reticulin stain showed mild increase in reticulin fibers.  Chromosome analysis shows 63, XX. - No prior history of vasomotor symptoms or thrombosis.  Given her age more than 5, she was recommended to start on hydroxyurea. - She takes hydroxyurea 500 mg daily started on 12/26/2017, increased to 2 tablets daily on 02/07/2018. -On 07/22/2018, her platelet count dropped to 72.  Hence we will cut back the dose of Hydrea to 2 tablets on Monday Wednesdays and Fridays and 1 tablet on the other days. - Hydroxyurea dose reduced to 1 tablet daily on 09/19/2021, dose reduced to 1 tablet Monday through Friday on 05/22/2022   2.  Sleeplessness: - She is taking Xanax 0.25 mg as needed for sleep which is helping.   3.  Lung nodules: -Patient has a longtime smoking history. -CT of the chest on 03/25/2019 showed multiple small pulmonary nodules are again noted in the lungs bilaterally, largest of which is in the posterior aspect of the right upper lobe near the apex.  -CT of the chest on 04/02/2020 showed lung RADS 2 follow-up in 1 year  Plan: 1.  Denise Maynard: - She is tolerating hydroxyurea Monday through Friday 1 tablet daily very well. - Denies any aquagenic pruritus or vasomotor symptoms.  No B symptoms. - Reviewed labs from today which shows hematocrit 39.8, platelet count 297, normal white count.  LDH is normal.  LFTs are normal. - Continue hydroxyurea 1 tablet daily Monday through Friday.  RTC 4 months for follow-up.     2.  Sleeplessness: - Continue  Xanax as needed for sleep.   3.  Smoking history: - Last CT chest lung cancer screening scan on 05/08/2022 was lung RADS 2. - Will plan on repeating in September this year.    Orders Placed This Encounter  Procedures   CBC with Differential/Platelet    Standing Status:   Future    Standing Expiration Date:   09/23/2023    Order Specific Question:   Release to patient    Answer:   Immediate   Comprehensive metabolic panel    Standing Status:   Future    Standing Expiration Date:   09/23/2023    Order Specific Question:   Release to patient    Answer:   Immediate   Lactate dehydrogenase    Standing Status:   Future    Standing Expiration Date:   09/23/2023    Order Specific Question:   Release to patient    Answer:   Immediate      Kaleen Odea as a scribe for Derek Jack, MD.,have documented all relevant documentation on the behalf of Derek Jack, MD,as directed by  Derek Jack, MD while in the presence of Derek Jack, MD.   I, Derek Jack MD, have reviewed the above documentation for accuracy and completeness, and I agree with the above.   Derek Jack, MD   2/12/20244:21 PM  CHIEF COMPLAINT:   Diagnosis: JAK2 positive polycythemia    Cancer Staging  No matching staging information was found for the patient.  Prior Therapy: None  Current Therapy:  hydrea   HISTORY OF PRESENT ILLNESS:   Oncology History   No history exists.     INTERVAL HISTORY:   Denise Maynard is a 67 y.o. female presenting to clinic today for follow up of JAK2 positive polycythemia . She was last seen by me on 05/22/2022.  Today, she states that she is doing well overall. Her appetite level is at 85%. Her energy level is at 90%. She takes Hydrea 529m Monday through Friday. She denies any new problems, infections, or ER visits within the last 3 months. She denies any nausea. She itches occasionally on her upper back after showers, which  resolves after a minute or so. She takes Xanax as needed for her anxiety.  PAST MEDICAL HISTORY:   Past Medical History: Past Medical History:  Diagnosis Date   [place]otid artery [place]    Coil embolization of a supraclinoid right internal carotid artery - Dr. NKathyrn Sheriff  Hyperlipidemia    Nicotine addiction 05/15/2015   Will try patch   Serum calcium elevated 11/19/2017   recheck   Serum potassium elevated 11/19/2017   Stroke ([facility]     Surgical History: Past Surgical History:  Procedure Laterality Date   ABDOMINAL AORTAGRAM N/A 11/24/2014   Procedure: ABDOMINAL AMaxcine Ham  Surgeon: CElam Dutch MD;  [facility];  Service: Cardiovascular;  Laterality: N/A;   Aneursym repair  10/2014   COLONOSCOPY N/A 07/30/2015   Procedure: COLONOSCOPY;  Surgeon: SDanie Binder MD;  [facility];  Service: Endoscopy;  Laterality: N/A;  1:00 Pm - moved to 1:30 - office to notify   FRACTURE SURGERY     Jaw   LOWER EXTREMITY ANGIOGRAM  11/24/2014   Procedure: LOWER EXTREMITY ANGIOGRAM;  Surgeon: CElam Dutch MD;  [facility];  Service: Cardiovascular;;   MOUTH SURGERY     RADIOLOGY WITH ANESTHESIA N/A 10/12/2014   Procedure: Embolization/arteriogram;  Surgeon: NConsuella Lose MD;  [place] [facility]  Service: Radiology;  Laterality: N/A;    Social History: Social History   Socioeconomic History   Marital status: Divorced    Spouse name: Not on file   Number of children: Not on file   Years of education: Not on file   Highest education level: Not on file  Occupational History   Not on file  Tobacco Use   Smoking status: Every Day    Packs/day: 0.50    Years: 30.00    Total pack years: 15.00    Types: Cigarettes   Smokeless tobacco: Never  Substance and Sexual Activity   Alcohol use: No    Alcohol/week: 0.0 standard drinks of alcohol   Drug use: No   Sexual activity: Yes    Birth control/protection: Post-menopausal  Other Topics Concern    Not on file  Social History Narrative   Not on file   Social Determinants of Health   Financial Resource Strain: Not on file  Food Insecurity: Not on file  Transportation Needs: Not on file  Physical Activity: Not on file  Stress: Not on file  Social Connections: Not on file  Intimate Partner Violence: Not on file    Family History: Family History  Problem Relation Age of Onset   Heart attack Mother    Diabetes Mother    Throat cancer Father    Hypertension Father    Hyperlipidemia Sister    Diabetes Maternal Grandmother    Cancer Maternal Grandfather  Heart attack Paternal Grandfather    Hyperlipidemia Sister     Current Medications:  Current Outpatient Medications:    acetaminophen (TYLENOL) 500 MG tablet, Take 1,000 mg by mouth every 6 (six) hours as needed for mild pain or moderate pain., Disp: , Rfl:    ALPRAZolam (XANAX) 0.5 MG tablet, Take 1 tablet (0.5 mg total) by mouth at bedtime., Disp: 30 tablet, Rfl: 0   Artificial Tear Ointment (DRY EYES OP), Apply to eye daily. , Disp: , Rfl:    aspirin EC 81 MG tablet, Take 81 mg by mouth daily., Disp: , Rfl:    cholecalciferol (VITAMIN D) 1000 UNITS tablet, Take 4,000 Units by mouth daily., Disp: , Rfl:    hydroxyurea (HYDREA) 500 MG capsule, TAKE 2 CAPSULES BY  MOUTH ON MONDAY, WEDNESDAY, FRIDAY AND 1 CAPSULE ALL  OTHER DAYS, Disp: 145 capsule, Rfl: 0   Multiple Vitamins-Minerals (MULTIVITAMIN WITH MINERALS) tablet, Take 1 tablet by mouth daily., Disp: , Rfl:    Omega-3 Fatty Acids (FISH OIL) 500 MG CAPS, Take 500 mg by mouth once. , Disp: , Rfl:    rosuvastatin (CRESTOR) 5 MG tablet, Take 5 mg by mouth daily., Disp: , Rfl:    Allergies: No Known Allergies  REVIEW OF SYSTEMS:   Review of Systems  Constitutional:  Negative for chills, fatigue and fever.  HENT:   Negative for lump/mass, mouth sores, nosebleeds, sore throat and trouble swallowing.   Eyes:  Negative for eye problems.  Respiratory:  Negative for cough  and shortness of breath.   Cardiovascular:  Negative for chest pain, leg swelling and palpitations.  Gastrointestinal:  Negative for abdominal pain, constipation, diarrhea, nausea and vomiting.  Genitourinary:  Negative for bladder incontinence, difficulty urinating, dysuria, frequency, hematuria and nocturia.   Musculoskeletal:  Negative for arthralgias, back pain, flank pain, myalgias and neck pain.  Skin:  Negative for itching and rash.  Neurological:  Negative for dizziness, headaches and numbness.  Hematological:  Does not bruise/bleed easily.  Psychiatric/Behavioral:  Positive for sleep disturbance (Staying Asleep). Negative for depression and suicidal ideas. The patient is not nervous/anxious.   All other systems reviewed and are negative.    VITALS:   Blood pressure (!) 166/77, pulse 64, temperature 98.5 F (36.9 C), temperature source Oral, resp. rate 17, height 5' 5"$  (1.651 m), weight 146 lb (66.2 kg), SpO2 100 %.  Wt Readings from Last 3 Encounters:  09/22/22 146 lb (66.2 kg)  05/22/22 140 lb 11.2 oz (63.8 kg)  01/16/22 142 lb 4.8 oz (64.5 kg)    Body mass index is 24.3 kg/m.  Performance status (ECOG): 1 - Symptomatic but completely ambulatory  PHYSICAL EXAM:   Physical Exam Vitals and nursing note reviewed. Exam conducted with a chaperone present.  Constitutional:      Appearance: Normal appearance.  Cardiovascular:     Rate and Rhythm: Normal rate and regular rhythm.     Pulses: Normal pulses.     Heart sounds: Normal heart sounds.  Pulmonary:     Effort: Pulmonary effort is normal.     Breath sounds: Normal breath sounds.  Abdominal:     Palpations: Abdomen is soft. There is no hepatomegaly, splenomegaly or mass.     Tenderness: There is no abdominal tenderness.  Musculoskeletal:     Right lower leg: No edema.     Left lower leg: No edema.  Lymphadenopathy:     Cervical: No cervical adenopathy.     Right cervical: No superficial, deep or  posterior  cervical adenopathy.    Left cervical: No superficial, deep or posterior cervical adenopathy.     Upper Body:     Right upper body: No supraclavicular or axillary adenopathy.     Left upper body: No supraclavicular or axillary adenopathy.  Neurological:     General: No focal deficit present.     Mental Status: She is alert and oriented to person, place, and time.  Psychiatric:        Mood and Affect: Mood normal.        Behavior: Behavior normal.     LABS:      Latest Ref Rng & Units 09/22/2022    2:24 PM 05/22/2022    2:09 PM 01/16/2022    1:28 PM  CBC  WBC 4.0 - 10.5 K/uL 8.4  6.1  7.1   Hemoglobin 12.0 - 15.0 g/dL 13.3  12.5  13.2   Hematocrit 36.0 - 46.0 % 39.8  37.0  38.8   Platelets 150 - 400 K/uL 297  246  279       Latest Ref Rng & Units 09/22/2022    2:24 PM 05/22/2022    2:09 PM 01/16/2022    1:28 PM  CMP  Glucose 70 - 99 mg/dL 115  142  153   BUN 8 - 23 mg/dL 10  10  12   $ Creatinine 0.44 - 1.00 mg/dL 0.79  0.66  0.75   Sodium 135 - 145 mmol/L 141  139  139   Potassium 3.5 - 5.1 mmol/L 4.3  3.7  3.6   Chloride 98 - 111 mmol/L 105  105  103   CO2 22 - 32 mmol/L 27  27  26   $ Calcium 8.9 - 10.3 mg/dL 9.6  9.4  9.4   Total Protein 6.5 - 8.1 g/dL 7.3  7.3  7.4   Total Bilirubin 0.3 - 1.2 mg/dL 0.6  0.6  0.5   Alkaline Phos 38 - 126 U/L 68  71  77   AST 15 - 41 U/L 26  23  22   $ ALT 0 - 44 U/L 20  19  17      $ No results found for: "CEA1", "CEA" / No results found for: "CEA1", "CEA" No results found for: "PSA1" No results found for: "[id number]" No results found for: "CAN125"  No results found for: "TOTALPROTELP", "ALBUMINELP", "A1GS", "A2GS", "BETS", "BETA2SER", "GAMS", "MSPIKE", "SPEI" No results found for: "TIBC", "FERRITIN", "IRONPCTSAT" Lab Results  Component Value Date   LDH 175 09/22/2022   LDH 160 05/22/2022   LDH 164 01/16/2022     STUDIES:   No results found.

## 2022-09-22 NOTE — Patient Instructions (Addendum)
[facility]  Discharge Instructions  You were seen and examined today by Dr. Delton Coombes.  Dr. Delton Coombes discussed your most recent lab work which revealed that everything looks good.  Continue Hydrea as prescribed.  Follow-up as scheduled in 4 months with labs.    Thank you for choosing [facility] to provide your oncology and hematology care.   To afford each patient quality time with our provider, please arrive at least 15 minutes before your scheduled appointment time. You may need to reschedule your appointment if you arrive late (10 or more minutes). Arriving late affects you and other patients whose appointments are after yours.  Also, if you miss three or more appointments without notifying the office, you may be dismissed from the clinic at the provider's discretion.    Again, thank you for choosing [facility].  Our hope is that these requests will decrease the amount of time that you wait before being seen by our physicians.   If you have a lab appointment with the [facility] please come in thru the Main Entrance and check in at the main information desk.           _____________________________________________________________  Should you have questions after your visit to [facility], please contact our office at [phone] and follow the prompts.  Our office hours are 8:00 a.m. to 4:30 p.m. Monday - Thursday and 8:00 a.m. to 2:30 p.m. Friday.  Please note that voicemails left after 4:00 p.m. may not be returned until the following business day.  We are closed weekends and all major holidays.  You do have access to a nurse 24-7, just call the main number to the clinic [phone] and do not press any options, hold on the line and a nurse will answer the phone.    For prescription refill requests, have your pharmacy contact our office and allow 72 hours.    Masks are optional in the cancer  centers. If you would like for your care team to wear a mask while they are taking care of you, please let them know. You may have one support person who is at least 69 years old accompany you for your appointments.

## 2023-01-25 NOTE — Progress Notes (Signed)
[facility] [address] [place] [address]    Clinic Day:  01/26/2023  Referring physician: Assunta Found, MD  Patient Care Team: Assunta Found, MD as PCP - General (Family Medicine) Lanelle Bal, DO as Consulting Physician (Internal Medicine)   ASSESSMENT & PLAN:   Assessment: 1. [name]: - BMBX on 01/11/2018 shows hypercellular (50 to 70% cellularity) marrow with trilineage hematopoiesis.  Reticulin stain showed mild increase in reticulin fibers.  Chromosome analysis shows 47, XX. - No prior history of vasomotor symptoms or thrombosis.  Given her age more than 41, she was recommended to start on hydroxyurea. - She takes hydroxyurea 500 mg daily started on 12/26/2017, increased to 2 tablets daily on 02/07/2018. -On 07/22/2018, her platelet count dropped to 72.  Hence we will cut back the dose of Hydrea to 2 tablets on Monday Wednesdays and Fridays and 1 tablet on the other days. - Hydroxyurea dose reduced to 1 tablet daily on 09/19/2021, dose reduced to 1 tablet Monday through Friday on 05/22/2022   2.  Sleeplessness: - She is taking Xanax 0.25 mg as needed for sleep which is helping.   3.  Lung nodules: -Patient has a longtime smoking history. -CT of the chest on 03/25/2019 showed multiple small pulmonary nodules are again noted in the lungs bilaterally, largest of which is in the posterior aspect of the right upper lobe near the apex.  -CT of the chest on 04/02/2020 showed lung RADS 2 follow-up in 1 year    Plan: 1.  [name]: - She is taking hydroxyurea Monday through Friday 1 tablet daily and is tolerating well. - Denies any significant aquagenic pruritus or vasomotor symptoms.  No infections noted. - Reviewed labs from 01/26/2023: Hematocrit 43, platelets 315, WBC 11.6 with ANC 8.3. - Physical exam: Spleen not palpable. - Continue same dose of hydroxyurea 1 tablet Monday through Friday.  Continue aspirin 81 mg daily. -  RTC 4 months with repeat labs.  If the white count continues to be high, will increase Hydrea to 7 days a week.   2.  Smoking history: - CT chest lung cancer screening scan on 05/08/2022 with lung RADS 2. - We will repeat scan in September.      Orders Placed This Encounter  Procedures   CT CHEST LUNG CA SCREEN LOW DOSE W/O CM    Standing Status:   Future    Standing Expiration Date:   01/26/2024    Order Specific Question:   Preferred Imaging Location?    Answer:   [facility]    Order Specific Question:   Release to patient    Answer:   Immediate [1]   CBC with Differential/Platelet    Standing Status:   Future    Standing Expiration Date:   01/26/2024    Order Specific Question:   Release to patient    Answer:   Immediate   Comprehensive metabolic panel    Standing Status:   Future    Standing Expiration Date:   01/26/2024    Order Specific Question:   Release to patient    Answer:   Immediate   Lactate dehydrogenase    Standing Status:   Future    Standing Expiration Date:   01/26/2024    Order Specific Question:   Release to patient    Answer:   Immediate      I,Katie Daubenspeck,acting as a scribe for Doreatha Massed, MD.,have documented  all relevant documentation on the behalf of Doreatha Massed, MD,as directed by  Doreatha Massed, MD while in the presence of Doreatha Massed, MD.   I, Doreatha Massed MD, have reviewed the above documentation for accuracy and completeness, and I agree with the above.   Doreatha Massed, MD   6/17/20244:03 PM  CHIEF COMPLAINT:   Diagnosis:  JAK2 positive polycythemia    Cancer Staging  No matching staging information was found for the patient.   Prior Therapy: none  Current Therapy:  hydrea    HISTORY OF PRESENT ILLNESS:   Oncology History   No history exists.     INTERVAL HISTORY:   Desta is a 67 y.o. female presenting to clinic today for follow up of  JAK2 positive polycythemia. She was  last seen by me on 09/22/22.  Today, she states that she is doing well overall. Her appetite level is at 100%. Her energy level is at 85%.  PAST MEDICAL HISTORY:   Past Medical History: Past Medical History:  Diagnosis Date   Carotid artery aneurysm (HCC)    Coil embolization of a supraclinoid right internal carotid artery - Dr. Conchita Paris   Hyperlipidemia    Nicotine addiction 05/15/2015   Will try patch   Serum calcium elevated 11/19/2017   recheck   Serum potassium elevated 11/19/2017   Stroke [facility])     Surgical History: Past Surgical History:  Procedure Laterality Date   ABDOMINAL AORTAGRAM N/A 11/24/2014   Procedure: ABDOMINAL Ronny Flurry;  Surgeon: Sherren Kerns, MD;  Location: [facility] CATH LAB;  Service: Cardiovascular;  Laterality: N/A;   Aneursym repair  10/2014   COLONOSCOPY N/A 07/30/2015   Procedure: COLONOSCOPY;  Surgeon: West Bali, MD;  Location: [facility] ENDO SUITE;  Service: Endoscopy;  Laterality: N/A;  1:00 Pm - moved to 1:30 - office to notify   FRACTURE SURGERY     Jaw   LOWER EXTREMITY ANGIOGRAM  11/24/2014   Procedure: LOWER EXTREMITY ANGIOGRAM;  Surgeon: Sherren Kerns, MD;  Location: [facility] CATH LAB;  Service: Cardiovascular;;   MOUTH SURGERY     RADIOLOGY WITH ANESTHESIA N/A 10/12/2014   Procedure: Embolization/arteriogram;  Surgeon: Lisbeth Renshaw, MD;  Location: [facility] OR;  Service: Radiology;  Laterality: N/A;    Social History: Social History   Socioeconomic History   Marital status: Divorced    Spouse name: Not on file   Number of children: Not on file   Years of education: Not on file   Highest education level: Not on file  Occupational History   Not on file  Tobacco Use   Smoking status: Every Day    Packs/day: 0.50    Years: 30.00    Additional pack years: 0.00    Total pack years: 15.00    Types: Cigarettes   Smokeless tobacco: Never  Substance and Sexual Activity   Alcohol use: No    Alcohol/week: 0.0 standard drinks of alcohol   Drug use:  No   Sexual activity: Yes    Birth control/protection: Post-menopausal  Other Topics Concern   Not on file  Social History Narrative   Not on file   Social Determinants of Health   Financial Resource Strain: Not on file  Food Insecurity: Not on file  Transportation Needs: Not on file  Physical Activity: Not on file  Stress: Not on file  Social Connections: Not on file  Intimate Partner Violence: Not on file    Family History: Family History  Problem Relation Age of Onset  Heart attack Mother    Diabetes Mother    Throat cancer Father    Hypertension Father    Hyperlipidemia Sister    Diabetes Maternal Grandmother    Cancer Maternal Grandfather    Heart attack Paternal Grandfather    Hyperlipidemia Sister     Current Medications:  Current Outpatient Medications:    acetaminophen (TYLENOL) 500 MG tablet, Take 1,000 mg by mouth every 6 (six) hours as needed for mild pain or moderate pain., Disp: , Rfl:    ALPRAZolam (XANAX) 0.5 MG tablet, Take 1 tablet (0.5 mg total) by mouth at bedtime., Disp: 30 tablet, Rfl: 0   Artificial Tear Ointment (DRY EYES OP), Apply to eye daily. , Disp: , Rfl:    aspirin EC 81 MG tablet, Take 81 mg by mouth daily., Disp: , Rfl:    cholecalciferol (VITAMIN D) 1000 UNITS tablet, Take 4,000 Units by mouth daily., Disp: , Rfl:    hydroxyurea (HYDREA) 500 MG capsule, Take 1 capsule (500 mg total) by mouth daily. May take with food to minimize GI side effects., Disp: 90 capsule, Rfl: 3   Multiple Vitamins-Minerals (MULTIVITAMIN WITH MINERALS) tablet, Take 1 tablet by mouth daily., Disp: , Rfl:    Omega-3 Fatty Acids (FISH OIL) 500 MG CAPS, Take 500 mg by mouth once. , Disp: , Rfl:    rosuvastatin (CRESTOR) 5 MG tablet, Take 5 mg by mouth daily., Disp: , Rfl:    Allergies: No Known Allergies  REVIEW OF SYSTEMS:   Review of Systems  Constitutional:  Negative for chills, fatigue and fever.  HENT:   Negative for lump/mass, mouth sores, nosebleeds,  sore throat and trouble swallowing.   Eyes:  Negative for eye problems.  Respiratory:  Positive for cough. Negative for shortness of breath.   Cardiovascular:  Negative for chest pain, leg swelling and palpitations.  Gastrointestinal:  Negative for abdominal pain, constipation, diarrhea, nausea and vomiting.  Genitourinary:  Negative for bladder incontinence, difficulty urinating, dysuria, frequency, hematuria and nocturia.   Musculoskeletal:  Negative for arthralgias, back pain, flank pain, myalgias and neck pain.  Skin:  Negative for itching and rash.  Neurological:  Negative for dizziness, headaches and numbness.  Hematological:  Does not bruise/bleed easily.  Psychiatric/Behavioral:  Negative for depression, sleep disturbance and suicidal ideas. The patient is not nervous/anxious.   All other systems reviewed and are negative.    VITALS:   Blood pressure 125/68, pulse 65, temperature 98.4 F (36.9 C), temperature source Oral, resp. rate 18, height 5\' 5"  (1.651 m), weight 142 lb 11.2 oz (64.7 kg), SpO2 100 %.  Wt Readings from Last 3 Encounters:  01/26/23 142 lb 11.2 oz (64.7 kg)  09/22/22 146 lb (66.2 kg)  05/22/22 140 lb 11.2 oz (63.8 kg)    Body mass index is 23.75 kg/m.  Performance status (ECOG): 1 - Symptomatic but completely ambulatory  PHYSICAL EXAM:   Physical Exam Vitals and nursing note reviewed. Exam conducted with a chaperone present.  Constitutional:      Appearance: Normal appearance.  Cardiovascular:     Rate and Rhythm: Normal rate and regular rhythm.     Pulses: Normal pulses.     Heart sounds: Normal heart sounds.  Pulmonary:     Effort: Pulmonary effort is normal.     Breath sounds: Normal breath sounds.  Abdominal:     Palpations: Abdomen is soft. There is no hepatomegaly, splenomegaly or mass.     Tenderness: There is no abdominal tenderness.  Musculoskeletal:  Right lower leg: No edema.     Left lower leg: No edema.  Lymphadenopathy:      Cervical: No cervical adenopathy.     Right cervical: No superficial, deep or posterior cervical adenopathy.    Left cervical: No superficial, deep or posterior cervical adenopathy.     Upper Body:     Right upper body: No supraclavicular or axillary adenopathy.     Left upper body: No supraclavicular or axillary adenopathy.  Neurological:     General: No focal deficit present.     Mental Status: She is alert and oriented to person, place, and time.  Psychiatric:        Mood and Affect: Mood normal.        Behavior: Behavior normal.     LABS:      Latest Ref Rng & Units 01/26/2023    2:10 PM 09/22/2022    2:24 PM 05/22/2022    2:09 PM  CBC  WBC 4.0 - 10.5 K/uL 11.6  8.4  6.1   Hemoglobin 12.0 - 15.0 g/dL 16.1  09.6  04.5   Hematocrit 36.0 - 46.0 % 43.2  39.8  37.0   Platelets 150 - 400 K/uL 315  297  246       Latest Ref Rng & Units 01/26/2023    2:10 PM 09/22/2022    2:24 PM 05/22/2022    2:09 PM  CMP  Glucose 70 - 99 mg/dL 409  811  914   BUN 8 - 23 mg/dL 13  10  10    Creatinine 0.44 - 1.00 mg/dL 7.82  9.56  2.13   Sodium 135 - 145 mmol/L 139  141  139   Potassium 3.5 - 5.1 mmol/L 4.2  4.3  3.7   Chloride 98 - 111 mmol/L 104  105  105   CO2 22 - 32 mmol/L 26  27  27    Calcium 8.9 - 10.3 mg/dL 9.8  9.6  9.4   Total Protein 6.5 - 8.1 g/dL 7.8  7.3  7.3   Total Bilirubin 0.3 - 1.2 mg/dL 1.0  0.6  0.6   Alkaline Phos 38 - 126 U/L 80  68  71   AST 15 - 41 U/L 21  26  23    ALT 0 - 44 U/L 19  20  19       No results found for: "CEA1", "CEA" / No results found for: "CEA1", "CEA" No results found for: "PSA1" No results found for: "YQM578" No results found for: "CAN125"  No results found for: "TOTALPROTELP", "ALBUMINELP", "A1GS", "A2GS", "BETS", "BETA2SER", "GAMS", "MSPIKE", "SPEI" No results found for: "TIBC", "FERRITIN", "IRONPCTSAT" Lab Results  Component Value Date   LDH 195 (H) 01/26/2023   LDH 175 09/22/2022   LDH 160 05/22/2022     STUDIES:   No results  found.

## 2023-01-26 ENCOUNTER — Inpatient Hospital Stay: Payer: Medicare Other

## 2023-01-26 ENCOUNTER — Other Ambulatory Visit: Payer: Self-pay | Admitting: Hematology

## 2023-01-26 ENCOUNTER — Inpatient Hospital Stay: Payer: Medicare Other | Attending: Hematology | Admitting: Hematology

## 2023-01-26 VITALS — BP 125/68 | HR 65 | Temp 98.4°F | Resp 18 | Ht 65.0 in | Wt 142.7 lb

## 2023-01-26 DIAGNOSIS — D75839 Thrombocytosis, unspecified: Secondary | ICD-10-CM | POA: Diagnosis not present

## 2023-01-26 DIAGNOSIS — D471 Chronic myeloproliferative disease: Secondary | ICD-10-CM

## 2023-01-26 DIAGNOSIS — D45 Polycythemia vera: Secondary | ICD-10-CM | POA: Diagnosis not present

## 2023-01-26 DIAGNOSIS — Z7982 Long term (current) use of aspirin: Secondary | ICD-10-CM | POA: Diagnosis not present

## 2023-01-26 DIAGNOSIS — Z87891 Personal history of nicotine dependence: Secondary | ICD-10-CM

## 2023-01-26 DIAGNOSIS — R918 Other nonspecific abnormal finding of lung field: Secondary | ICD-10-CM | POA: Diagnosis not present

## 2023-01-26 DIAGNOSIS — Z1589 Genetic susceptibility to other disease: Secondary | ICD-10-CM

## 2023-01-26 DIAGNOSIS — Z122 Encounter for screening for malignant neoplasm of respiratory organs: Secondary | ICD-10-CM | POA: Diagnosis not present

## 2023-01-26 LAB — CBC WITH DIFFERENTIAL/PLATELET
Abs Immature Granulocytes: 0.05 10*3/uL (ref 0.00–0.07)
Basophils Absolute: 0.1 10*3/uL (ref 0.0–0.1)
Basophils Relative: 0 %
Eosinophils Absolute: 0.1 10*3/uL (ref 0.0–0.5)
Eosinophils Relative: 1 %
HCT: 43.2 % (ref 36.0–46.0)
Hemoglobin: 14.6 g/dL (ref 12.0–15.0)
Immature Granulocytes: 0 %
Lymphocytes Relative: 20 %
Lymphs Abs: 2.3 10*3/uL (ref 0.7–4.0)
MCH: 35.6 pg — ABNORMAL HIGH (ref 26.0–34.0)
MCHC: 33.8 g/dL (ref 30.0–36.0)
MCV: 105.4 fL — ABNORMAL HIGH (ref 80.0–100.0)
Monocytes Absolute: 0.9 10*3/uL (ref 0.1–1.0)
Monocytes Relative: 7 %
Neutro Abs: 8.3 10*3/uL — ABNORMAL HIGH (ref 1.7–7.7)
Neutrophils Relative %: 72 %
Platelets: 315 10*3/uL (ref 150–400)
RBC: 4.1 MIL/uL (ref 3.87–5.11)
RDW: 14.3 % (ref 11.5–15.5)
WBC: 11.6 10*3/uL — ABNORMAL HIGH (ref 4.0–10.5)
nRBC: 0 % (ref 0.0–0.2)

## 2023-01-26 LAB — COMPREHENSIVE METABOLIC PANEL
ALT: 19 U/L (ref 0–44)
AST: 21 U/L (ref 15–41)
Albumin: 4.5 g/dL (ref 3.5–5.0)
Alkaline Phosphatase: 80 U/L (ref 38–126)
Anion gap: 9 (ref 5–15)
BUN: 13 mg/dL (ref 8–23)
CO2: 26 mmol/L (ref 22–32)
Calcium: 9.8 mg/dL (ref 8.9–10.3)
Chloride: 104 mmol/L (ref 98–111)
Creatinine, Ser: 0.73 mg/dL (ref 0.44–1.00)
GFR, Estimated: >60 mL/min (ref >60)
Glucose, Bld: 110 mg/dL — ABNORMAL HIGH (ref 70–99)
Potassium: 4.2 mmol/L (ref 3.5–5.1)
Sodium: 139 mmol/L (ref 135–145)
Total Bilirubin: 1 mg/dL (ref 0.3–1.2)
Total Protein: 7.8 g/dL (ref 6.5–8.1)

## 2023-01-26 LAB — LACTATE DEHYDROGENASE: LDH: 195 U/L — ABNORMAL HIGH (ref 98–192)

## 2023-01-26 MED ORDER — HYDROXYUREA 500 MG PO CAPS: 500.0000 mg | ORAL_CAPSULE | Freq: Every day | ORAL | 3 refills | Status: DC

## 2023-01-26 NOTE — Addendum Note (Signed)
Addended by: Doreatha Massed on: 01/26/2023 05:59 PM   Modules accepted: Orders

## 2023-01-26 NOTE — Patient Instructions (Signed)
[facility]  Discharge Instructions  You were seen and examined today by Dr. Ellin Saba.  Dr. Ellin Saba discussed your most recent lab work and CT scan which revealed that everything looks good and stable.  Dr. Ellin Saba will repeat the lung cancer screening in September 2024.  Follow-up as scheduled in 4 months.    Thank you for choosing [facility] to provide your oncology and hematology care.   To afford each patient quality time with our provider, please arrive at least 15 minutes before your scheduled appointment time. You may need to reschedule your appointment if you arrive late (10 or more minutes). Arriving late affects you and other patients whose appointments are after yours.  Also, if you miss three or more appointments without notifying the office, you may be dismissed from the clinic at the provider's discretion.    Again, thank you for choosing [facility].  Our hope is that these requests will decrease the amount of time that you wait before being seen by our physicians.   If you have a lab appointment with the Cancer Center - please note that after April 8th, all labs will be drawn in the cancer center.  You do not have to check in or register with the main entrance as you have in the past but will complete your check-in at the cancer center.            _____________________________________________________________  Should you have questions after your visit to [facility], please contact our office at [phone] and follow the prompts.  Our office hours are 8:00 a.m. to 4:30 p.m. Monday - Thursday and 8:00 a.m. to 2:30 p.m. Friday.  Please note that voicemails left after 4:00 p.m. may not be returned until the following business day.  We are closed weekends and all major holidays.  You do have access to a nurse 24-7, just call the main number to the clinic [phone] and do not press any  options, hold on the line and a nurse will answer the phone.    For prescription refill requests, have your pharmacy contact our office and allow 72 hours.    Masks are no longer required in the cancer centers. If you would like for your care team to wear a mask while they are taking care of you, please let them know. You may have one support person who is at least 69 years old accompany you for your appointments.

## 2023-02-18 ENCOUNTER — Other Ambulatory Visit: Payer: Self-pay | Admitting: *Deleted

## 2023-02-26 DIAGNOSIS — M5030 Other cervical disc degeneration, unspecified cervical region: Secondary | ICD-10-CM | POA: Diagnosis not present

## 2023-02-26 DIAGNOSIS — F17200 Nicotine dependence, unspecified, uncomplicated: Secondary | ICD-10-CM | POA: Diagnosis not present

## 2023-02-26 DIAGNOSIS — D45 Polycythemia vera: Secondary | ICD-10-CM | POA: Diagnosis not present

## 2023-02-26 DIAGNOSIS — Z6824 Body mass index (BMI) 24.0-24.9, adult: Secondary | ICD-10-CM | POA: Diagnosis not present

## 2023-02-26 DIAGNOSIS — Z0001 Encounter for general adult medical examination with abnormal findings: Secondary | ICD-10-CM | POA: Diagnosis not present

## 2023-02-26 DIAGNOSIS — E785 Hyperlipidemia, unspecified: Secondary | ICD-10-CM | POA: Diagnosis not present

## 2023-04-17 ENCOUNTER — Encounter (HOSPITAL_COMMUNITY): Payer: Self-pay | Admitting: Family Medicine

## 2023-04-27 ENCOUNTER — Encounter (HOSPITAL_COMMUNITY): Payer: Self-pay | Admitting: Family Medicine

## 2023-04-28 ENCOUNTER — Other Ambulatory Visit (HOSPITAL_COMMUNITY): Payer: Self-pay | Admitting: Adult Health

## 2023-04-28 ENCOUNTER — Other Ambulatory Visit (HOSPITAL_COMMUNITY): Payer: Self-pay | Admitting: Family Medicine

## 2023-04-28 DIAGNOSIS — E2839 Other primary ovarian failure: Secondary | ICD-10-CM

## 2023-04-28 DIAGNOSIS — Z1231 Encounter for screening mammogram for malignant neoplasm of breast: Secondary | ICD-10-CM

## 2023-05-13 ENCOUNTER — Ambulatory Visit (HOSPITAL_COMMUNITY): Payer: Medicare Other

## 2023-05-13 ENCOUNTER — Encounter (HOSPITAL_COMMUNITY): Payer: Self-pay

## 2023-05-13 ENCOUNTER — Inpatient Hospital Stay: Payer: Medicare Other | Attending: Hematology

## 2023-05-13 ENCOUNTER — Ambulatory Visit (HOSPITAL_COMMUNITY)
Admission: RE | Admit: 2023-05-13 | Discharge: 2023-05-13 | Disposition: A | Discharge status: Home / Self Care | Payer: Medicare Other | Source: Ambulatory Visit | Attending: Adult Health | Admitting: Adult Health

## 2023-05-13 ENCOUNTER — Ambulatory Visit (HOSPITAL_COMMUNITY)
Admission: RE | Admit: 2023-05-13 | Discharge: 2023-05-13 | Disposition: A | Discharge status: Home / Self Care | Payer: Medicare Other | Source: Ambulatory Visit | Attending: Family Medicine | Admitting: Family Medicine

## 2023-05-13 DIAGNOSIS — Z1589 Genetic susceptibility to other disease: Secondary | ICD-10-CM

## 2023-05-13 DIAGNOSIS — Z79899 Other long term (current) drug therapy: Secondary | ICD-10-CM | POA: Insufficient documentation

## 2023-05-13 DIAGNOSIS — Z1231 Encounter for screening mammogram for malignant neoplasm of breast: Secondary | ICD-10-CM | POA: Diagnosis not present

## 2023-05-13 DIAGNOSIS — G4700 Insomnia, unspecified: Secondary | ICD-10-CM | POA: Insufficient documentation

## 2023-05-13 DIAGNOSIS — E2839 Other primary ovarian failure: Secondary | ICD-10-CM | POA: Diagnosis present

## 2023-05-13 DIAGNOSIS — D75839 Thrombocytosis, unspecified: Secondary | ICD-10-CM

## 2023-05-13 DIAGNOSIS — M810 Age-related osteoporosis without current pathological fracture: Secondary | ICD-10-CM | POA: Diagnosis not present

## 2023-05-13 DIAGNOSIS — D45 Polycythemia vera: Secondary | ICD-10-CM | POA: Diagnosis not present

## 2023-05-13 DIAGNOSIS — D471 Chronic myeloproliferative disease: Secondary | ICD-10-CM

## 2023-05-13 DIAGNOSIS — Z780 Asymptomatic menopausal state: Secondary | ICD-10-CM | POA: Diagnosis not present

## 2023-05-13 LAB — LACTATE DEHYDROGENASE: LDH: 213 U/L — ABNORMAL HIGH (ref 98–192)

## 2023-05-13 LAB — COMPREHENSIVE METABOLIC PANEL
ALT: 18 U/L (ref 0–44)
AST: 25 U/L (ref 15–41)
Albumin: 4.2 g/dL (ref 3.5–5.0)
Alkaline Phosphatase: 72 U/L (ref 38–126)
Anion gap: 10 (ref 5–15)
BUN: 10 mg/dL (ref 8–23)
CO2: 25 mmol/L (ref 22–32)
Calcium: 9.4 mg/dL (ref 8.9–10.3)
Chloride: 103 mmol/L (ref 98–111)
Creatinine, Ser: 0.71 mg/dL (ref 0.44–1.00)
GFR, Estimated: >60 mL/min (ref >60)
Glucose, Bld: 122 mg/dL — ABNORMAL HIGH (ref 70–99)
Potassium: 4.1 mmol/L (ref 3.5–5.1)
Sodium: 138 mmol/L (ref 135–145)
Total Bilirubin: 0.6 mg/dL (ref 0.3–1.2)
Total Protein: 7.6 g/dL (ref 6.5–8.1)

## 2023-05-13 LAB — CBC WITH DIFFERENTIAL/PLATELET
Abs Immature Granulocytes: 0.03 10*3/uL (ref 0.00–0.07)
Basophils Absolute: 0 10*3/uL (ref 0.0–0.1)
Basophils Relative: 1 %
Eosinophils Absolute: 0.1 10*3/uL (ref 0.0–0.5)
Eosinophils Relative: 2 %
HCT: 44.5 % (ref 36.0–46.0)
Hemoglobin: 14.4 g/dL (ref 12.0–15.0)
Immature Granulocytes: 0 %
Lymphocytes Relative: 25 %
Lymphs Abs: 2 10*3/uL (ref 0.7–4.0)
MCH: 34.6 pg — ABNORMAL HIGH (ref 26.0–34.0)
MCHC: 32.4 g/dL (ref 30.0–36.0)
MCV: 107 fL — ABNORMAL HIGH (ref 80.0–100.0)
Monocytes Absolute: 0.8 10*3/uL (ref 0.1–1.0)
Monocytes Relative: 10 %
Neutro Abs: 5 10*3/uL (ref 1.7–7.7)
Neutrophils Relative %: 62 %
Platelets: 329 10*3/uL (ref 150–400)
RBC: 4.16 MIL/uL (ref 3.87–5.11)
RDW: 14.2 % (ref 11.5–15.5)
WBC: 8 10*3/uL (ref 4.0–10.5)
nRBC: 0 % (ref 0.0–0.2)

## 2023-05-20 ENCOUNTER — Inpatient Hospital Stay: Payer: Medicare Other | Admitting: Hematology

## 2023-05-20 ENCOUNTER — Ambulatory Visit (HOSPITAL_COMMUNITY): Payer: Medicare Other

## 2023-05-21 ENCOUNTER — Ambulatory Visit (HOSPITAL_COMMUNITY)
Admission: RE | Admit: 2023-05-21 | Discharge: 2023-05-21 | Disposition: A | Discharge status: Home / Self Care | Payer: Medicare Other | Source: Ambulatory Visit | Attending: Hematology | Admitting: Hematology

## 2023-05-21 DIAGNOSIS — Z122 Encounter for screening for malignant neoplasm of respiratory organs: Secondary | ICD-10-CM

## 2023-05-21 DIAGNOSIS — Z87891 Personal history of nicotine dependence: Secondary | ICD-10-CM

## 2023-05-21 DIAGNOSIS — F17210 Nicotine dependence, cigarettes, uncomplicated: Secondary | ICD-10-CM | POA: Diagnosis not present

## 2023-06-10 ENCOUNTER — Inpatient Hospital Stay: Payer: Medicare Other | Admitting: Hematology

## 2023-06-10 VITALS — BP 132/73 | HR 69 | Temp 98.6°F | Resp 18 | Ht 65.0 in

## 2023-06-10 DIAGNOSIS — D45 Polycythemia vera: Secondary | ICD-10-CM | POA: Diagnosis not present

## 2023-06-10 DIAGNOSIS — D471 Chronic myeloproliferative disease: Secondary | ICD-10-CM

## 2023-06-10 DIAGNOSIS — D75839 Thrombocytosis, unspecified: Secondary | ICD-10-CM | POA: Diagnosis not present

## 2023-06-10 DIAGNOSIS — G4700 Insomnia, unspecified: Secondary | ICD-10-CM | POA: Diagnosis not present

## 2023-06-10 DIAGNOSIS — Z79899 Other long term (current) drug therapy: Secondary | ICD-10-CM | POA: Diagnosis not present

## 2023-06-10 NOTE — Patient Instructions (Addendum)
[facility]  Discharge Instructions  You were seen and examined today by Dr. Ellin Saba.  Dr. Ellin Saba discussed your most recent lab work which revealed that everything looks good and stable.  Follow-up as scheduled in 4 months.    Thank you for choosing [facility] to provide your oncology and hematology care.   To afford each patient quality time with our provider, please arrive at least 15 minutes before your scheduled appointment time. You may need to reschedule your appointment if you arrive late (10 or more minutes). Arriving late affects you and other patients whose appointments are after yours.  Also, if you miss three or more appointments without notifying the office, you may be dismissed from the clinic at the provider's discretion.    Again, thank you for choosing [facility].  Our hope is that these requests will decrease the amount of time that you wait before being seen by our physicians.   If you have a lab appointment with the Cancer Center - please note that after April 8th, all labs will be drawn in the cancer center.  You do not have to check in or register with the main entrance as you have in the past but will complete your check-in at the cancer center.            _____________________________________________________________  Should you have questions after your visit to [facility], please contact our office at [phone] and follow the prompts.  Our office hours are 8:00 a.m. to 4:30 p.m. Monday - Thursday and 8:00 a.m. to 2:30 p.m. Friday.  Please note that voicemails left after 4:00 p.m. may not be returned until the following business day.  We are closed weekends and all major holidays.  You do have access to a nurse 24-7, just call the main number to the clinic [phone] and do not press any options, hold on the line and a nurse will answer the phone.    For prescription  refill requests, have your pharmacy contact our office and allow 72 hours.    Masks are no longer required in the cancer centers. If you would like for your care team to wear a mask while they are taking care of you, please let them know. You may have one support person who is at least 69 years old accompany you for your appointments.

## 2023-06-10 NOTE — Progress Notes (Signed)
[facility] [address] [place] [address]    Clinic Day:  06/10/2023  Referring physician: Assunta Found, MD  Patient Care Team: Assunta Found, MD as PCP - General (Family Medicine) Lanelle Bal, DO as Consulting Physician (Internal Medicine)   ASSESSMENT & PLAN:   Assessment: 1. [name]: - BMBX on 01/11/2018 shows hypercellular (50 to 70% cellularity) marrow with trilineage hematopoiesis.  Reticulin stain showed mild increase in reticulin fibers.  Chromosome analysis shows 52, XX. - No prior history of vasomotor symptoms or thrombosis.  Given her age more than 82, she was recommended to start on hydroxyurea. - She takes hydroxyurea 500 mg daily started on 12/26/2017, increased to 2 tablets daily on 02/07/2018. -On 07/22/2018, her platelet count dropped to 72.  Hence we will cut back the dose of Hydrea to 2 tablets on Monday Wednesdays and Fridays and 1 tablet on the other days. - Hydroxyurea dose reduced to 1 tablet daily on 09/19/2021, dose reduced to 1 tablet Monday through Friday on 05/22/2022   2.  Sleeplessness: - She is taking Xanax 0.25 mg as needed for sleep which is helping.   3.  Lung nodules: -Patient has a longtime smoking history. -CT of the chest on 03/25/2019 showed multiple small pulmonary nodules are again noted in the lungs bilaterally, largest of which is in the posterior aspect of the right upper lobe near the apex.  -CT of the chest on 04/02/2020 showed lung RADS 2 follow-up in 1 year    Plan: 1.  [name]: - She is taking hydroxyurea 1 tablet Monday through Friday. - Denies any aquagenic pruritus/erythromelalgia's.  Denies any fevers, night sweats or weight loss.  No infections. - Physical exam: Spleen not palpable. - Labs: Hematocrit 44.5, PLT 329, white count 8.0.  LFTs are normal. - Continue hydroxyurea at the same dose 1 tablet daily Monday through Friday.  RTC 4 months for follow-up with repeat  labs.   2.  Smoking history: - We reviewed CT lung cancer screening scan from 05/21/2023: Lung RADS 2S with 1 year follow-up scan recommended.  I have also discussed other findings including aortic atherosclerosis, coronary calcium deposits in the left main and emphysema.  She does not have any chest pains or other related symptoms.  She was told to go to the ER should she develop any chest pain.      Orders Placed This Encounter  Procedures   CBC with Differential/Platelet    Standing Status:   Future    Standing Expiration Date:   06/09/2024    Order Specific Question:   Release to patient    Answer:   Immediate   Comprehensive metabolic panel    Standing Status:   Future    Standing Expiration Date:   06/09/2024    Order Specific Question:   Release to patient    Answer:   Immediate   Lactate dehydrogenase    Standing Status:   Future    Standing Expiration Date:   06/09/2024    Order Specific Question:   Release to patient    Answer:   Immediate      I,Helena R Teague,acting as a scribe for Doreatha Massed, MD.,have documented all relevant documentation on the behalf of Doreatha Massed, MD,as directed by  Doreatha Massed, MD while in the presence of Doreatha Massed, MD.  I, Doreatha Massed MD, have reviewed the above documentation for accuracy and completeness, and I agree with  the above.    Doreatha Massed, MD   10/30/20242:41 PM  CHIEF COMPLAINT:   Diagnosis:  JAK2 positive polycythemia    Cancer Staging  No matching staging information was found for the patient.    Prior Therapy: none  Current Therapy:  hydrea    HISTORY OF PRESENT ILLNESS:   Oncology History   No history exists.     INTERVAL HISTORY:   Denise Maynard is a 67 y.o. female presenting to clinic today for follow up of  JAK2 positive polycythemia. She was last seen by me on 01/26/23.  Since her last visit, she underwent CT lung cancer screening on 05/21/23 that was Lung-RADS  2S with aortic atherosclerosis, in addition to left main and 2 vessel coronary artery disease; and mild diffuse bronchial wall thickening with mild centrilobular and paraseptal emphysema, suggestive of COPD.   Today, she states that she is doing well overall. Her appetite level is at 70%. Her energy level is at 80%.  PAST MEDICAL HISTORY:   Past Medical History: Past Medical History:  Diagnosis Date   Carotid artery aneurysm (HCC)    Coil embolization of a supraclinoid right internal carotid artery - Dr. Conchita Paris   Hyperlipidemia    Nicotine addiction 05/15/2015   Will try patch   Serum calcium elevated 11/19/2017   recheck   Serum potassium elevated 11/19/2017   Stroke [facility])     Surgical History: Past Surgical History:  Procedure Laterality Date   ABDOMINAL AORTAGRAM N/A 11/24/2014   Procedure: ABDOMINAL Ronny Flurry;  Surgeon: Sherren Kerns, MD;  Location: [facility] CATH LAB;  Service: Cardiovascular;  Laterality: N/A;   Aneursym repair  10/2014   COLONOSCOPY N/A 07/30/2015   Procedure: COLONOSCOPY;  Surgeon: West Bali, MD;  Location: [facility] ENDO SUITE;  Service: Endoscopy;  Laterality: N/A;  1:00 Pm - moved to 1:30 - office to notify   FRACTURE SURGERY     Jaw   LOWER EXTREMITY ANGIOGRAM  11/24/2014   Procedure: LOWER EXTREMITY ANGIOGRAM;  Surgeon: Sherren Kerns, MD;  Location: [facility] CATH LAB;  Service: Cardiovascular;;   MOUTH SURGERY     RADIOLOGY WITH ANESTHESIA N/A 10/12/2014   Procedure: Embolization/arteriogram;  Surgeon: Lisbeth Renshaw, MD;  Location: [facility] OR;  Service: Radiology;  Laterality: N/A;    Social History: Social History   Socioeconomic History   Marital status: Divorced    Spouse name: Not on file   Number of children: Not on file   Years of education: Not on file   Highest education level: Not on file  Occupational History   Not on file  Tobacco Use   Smoking status: Every Day    Current packs/day: 0.50    Average packs/day: 0.5 packs/day for 30.0 years  (15.0 ttl pk-yrs)    Types: Cigarettes   Smokeless tobacco: Never  Substance and Sexual Activity   Alcohol use: No    Alcohol/week: 0.0 standard drinks of alcohol   Drug use: No   Sexual activity: Yes    Birth control/protection: Post-menopausal  Other Topics Concern   Not on file  Social History Narrative   Not on file   Social Determinants of Health   Financial Resource Strain: Not on file  Food Insecurity: Not on file  Transportation Needs: Not on file  Physical Activity: Not on file  Stress: Not on file  Social Connections: Not on file  Intimate Partner Violence: Not on file    Family History: Family History  Problem Relation Age of Onset  Heart attack Mother    Diabetes Mother    Throat cancer Father    Hypertension Father    Hyperlipidemia Sister    Diabetes Maternal Grandmother    Cancer Maternal Grandfather    Heart attack Paternal Grandfather    Hyperlipidemia Sister     Current Medications:  Current Outpatient Medications:    acetaminophen (TYLENOL) 500 MG tablet, Take 1,000 mg by mouth every 6 (six) hours as needed for mild pain or moderate pain., Disp: , Rfl:    alendronate (FOSAMAX) 70 MG tablet, Take 70 mg by mouth once a week., Disp: , Rfl:    ALPRAZolam (XANAX) 0.5 MG tablet, Take 1 tablet (0.5 mg total) by mouth at bedtime., Disp: 30 tablet, Rfl: 0   Artificial Tear Ointment (DRY EYES OP), Apply to eye daily. , Disp: , Rfl:    aspirin EC 81 MG tablet, Take 81 mg by mouth daily., Disp: , Rfl:    cholecalciferol (VITAMIN D) 1000 UNITS tablet, Take 4,000 Units by mouth daily., Disp: , Rfl:    hydroxyurea (HYDREA) 500 MG capsule, Take 1 capsule (500 mg total) by mouth daily. May take with food to minimize GI side effects., Disp: 90 capsule, Rfl: 3   Multiple Vitamins-Minerals (MULTIVITAMIN WITH MINERALS) tablet, Take 1 tablet by mouth daily., Disp: , Rfl:    Omega-3 Fatty Acids (FISH OIL) 500 MG CAPS, Take 500 mg by mouth once. , Disp: , Rfl:     rosuvastatin (CRESTOR) 5 MG tablet, Take 5 mg by mouth daily., Disp: , Rfl:    Allergies: No Known Allergies  REVIEW OF SYSTEMS:   Review of Systems  Constitutional:  Negative for chills, fatigue and fever.  HENT:   Negative for lump/mass, mouth sores, nosebleeds, sore throat and trouble swallowing.   Eyes:  Negative for eye problems.  Respiratory:  Negative for cough and shortness of breath.   Cardiovascular:  Negative for chest pain, leg swelling and palpitations.  Gastrointestinal:  Positive for constipation and diarrhea. Negative for abdominal pain, nausea and vomiting.  Genitourinary:  Negative for bladder incontinence, difficulty urinating, dysuria, frequency, hematuria and nocturia.   Musculoskeletal:  Negative for arthralgias, back pain, flank pain, myalgias and neck pain.  Skin:  Negative for itching and rash.  Neurological:  Negative for dizziness, headaches and numbness.  Hematological:  Does not bruise/bleed easily.  Psychiatric/Behavioral:  Positive for sleep disturbance. Negative for depression and suicidal ideas. The patient is not nervous/anxious.   All other systems reviewed and are negative.    VITALS:   Blood pressure 132/73, pulse 69, temperature 98.6 F (37 C), temperature source Oral, resp. rate 18, height 5\' 5"  (1.651 m), SpO2 98%.  Wt Readings from Last 3 Encounters:  01/26/23 142 lb 11.2 oz (64.7 kg)  09/22/22 146 lb (66.2 kg)  05/22/22 140 lb 11.2 oz (63.8 kg)    Body mass index is 23.75 kg/m.  Performance status (ECOG): 1 - Symptomatic but completely ambulatory  PHYSICAL EXAM:   Physical Exam Vitals and nursing note reviewed. Exam conducted with a chaperone present.  Constitutional:      Appearance: Normal appearance.  Cardiovascular:     Rate and Rhythm: Normal rate and regular rhythm.     Pulses: Normal pulses.     Heart sounds: Normal heart sounds.  Pulmonary:     Effort: Pulmonary effort is normal.     Breath sounds: Normal breath  sounds.  Abdominal:     Palpations: Abdomen is soft. There is no  hepatomegaly, splenomegaly or mass.     Tenderness: There is no abdominal tenderness.  Musculoskeletal:     Right lower leg: No edema.     Left lower leg: No edema.  Lymphadenopathy:     Cervical: No cervical adenopathy.     Right cervical: No superficial, deep or posterior cervical adenopathy.    Left cervical: No superficial, deep or posterior cervical adenopathy.     Upper Body:     Right upper body: No supraclavicular or axillary adenopathy.     Left upper body: No supraclavicular or axillary adenopathy.  Neurological:     General: No focal deficit present.     Mental Status: She is alert and oriented to person, place, and time.  Psychiatric:        Mood and Affect: Mood normal.        Behavior: Behavior normal.     LABS:      Latest Ref Rng & Units 05/13/2023    8:42 AM 01/26/2023    2:10 PM 09/22/2022    2:24 PM  CBC  WBC 4.0 - 10.5 K/uL 8.0  11.6  8.4   Hemoglobin 12.0 - 15.0 g/dL 40.9  81.1  91.4   Hematocrit 36.0 - 46.0 % 44.5  43.2  39.8   Platelets 150 - 400 K/uL 329  315  297       Latest Ref Rng & Units 05/13/2023    8:42 AM 01/26/2023    2:10 PM 09/22/2022    2:24 PM  CMP  Glucose 70 - 99 mg/dL 782  956  213   BUN 8 - 23 mg/dL 10  13  10    Creatinine 0.44 - 1.00 mg/dL 0.86  5.78  4.69   Sodium 135 - 145 mmol/L 138  139  141   Potassium 3.5 - 5.1 mmol/L 4.1  4.2  4.3   Chloride 98 - 111 mmol/L 103  104  105   CO2 22 - 32 mmol/L 25  26  27    Calcium 8.9 - 10.3 mg/dL 9.4  9.8  9.6   Total Protein 6.5 - 8.1 g/dL 7.6  7.8  7.3   Total Bilirubin 0.3 - 1.2 mg/dL 0.6  1.0  0.6   Alkaline Phos 38 - 126 U/L 72  80  68   AST 15 - 41 U/L 25  21  26    ALT 0 - 44 U/L 18  19  20       No results found for: "CEA1", "CEA" / No results found for: "CEA1", "CEA" No results found for: "PSA1" No results found for: "GEX528" No results found for: "CAN125"  No results found for: "TOTALPROTELP", "ALBUMINELP",  "A1GS", "A2GS", "BETS", "BETA2SER", "GAMS", "MSPIKE", "SPEI" No results found for: "TIBC", "FERRITIN", "IRONPCTSAT" Lab Results  Component Value Date   LDH 213 (H) 05/13/2023   LDH 195 (H) 01/26/2023   LDH 175 09/22/2022     STUDIES:   CT CHEST LUNG CA SCREEN LOW DOSE W/O CM  Result Date: 06/05/2023 CLINICAL DATA:  67 year old female current smoker with 39 pack-year history of smoking. Lung cancer screening examination. EXAM: CT CHEST WITHOUT CONTRAST LOW-DOSE FOR LUNG CANCER SCREENING TECHNIQUE: Multidetector CT imaging of the chest was performed following the standard protocol without IV contrast. RADIATION DOSE REDUCTION: This exam was performed according to the departmental dose-optimization program which includes automated exposure control, adjustment of the mA and/or kV according to patient size and/or use of iterative reconstruction technique. COMPARISON:  Multiple priors,  most recently low-dose lung cancer screening chest CT 05/08/2022. FINDINGS: Cardiovascular: Heart size is normal. There is no significant pericardial fluid, thickening or pericardial calcification. There is aortic atherosclerosis, as well as atherosclerosis of the great vessels of the mediastinum and the coronary arteries, including calcified atherosclerotic plaque in the left main, left anterior descending and right coronary arteries. Mediastinum/Nodes: No pathologically enlarged mediastinal or hilar lymph nodes. Please note that accurate exclusion of hilar adenopathy is limited on noncontrast CT scans. Esophagus is unremarkable in appearance. No axillary lymphadenopathy. Lungs/Pleura: Multiple small pulmonary nodules are noted throughout the lungs bilaterally, similar to the prior study. The largest of these is a nodular area of pleuroparenchymal thickening and architectural distortion near the apex of the right upper lobe (axial image 63), similar to the prior study, with a volume derived mean diameter of 9.9 mm. No other  larger more suspicious appearing pulmonary nodules or masses are noted. No acute consolidative airspace disease. No pleural effusions. Mild diffuse bronchial wall thickening with mild centrilobular and paraseptal emphysema. Upper Abdomen: Aortic atherosclerosis. Low-attenuation nodules in the adrenal glands bilaterally, compatible with benign adenomas (no imaging follow-up recommended), similar to prior studies, measuring up to 1.8 x 1.4 cm on the left (axial image 59 of series 2). Musculoskeletal: There are no aggressive appearing lytic or blastic lesions noted in the visualized portions of the skeleton. IMPRESSION: 1. Lung-RADS 2S, benign appearance or behavior. Continue annual screening with low-dose chest CT without contrast in 12 months. 2. The "S" modifier above refers to potentially clinically significant non lung cancer related findings. Specifically, there is aortic atherosclerosis, in addition to left main and 2 vessel coronary artery disease. Please note that although the presence of coronary artery calcium documents the presence of coronary artery disease, the severity of this disease and any potential stenosis cannot be assessed on this non-gated CT examination. Assessment for potential risk factor modification, dietary therapy or pharmacologic therapy may be warranted, if clinically indicated. 3. Mild diffuse bronchial wall thickening with mild centrilobular and paraseptal emphysema; imaging findings suggestive of underlying COPD. Aortic Atherosclerosis (ICD10-I70.0) and Emphysema (ICD10-J43.9). Electronically Signed   By: Trudie Reed M.D.   On: 06/05/2023 15:27   MM 3D SCREENING MAMMOGRAM BILATERAL BREAST  Result Date: 05/14/2023 CLINICAL DATA:  Screening. EXAM: DIGITAL SCREENING BILATERAL MAMMOGRAM WITH TOMOSYNTHESIS AND CAD TECHNIQUE: Bilateral screening digital craniocaudal and mediolateral oblique mammograms were obtained. Bilateral screening digital breast tomosynthesis was performed.  The images were evaluated with computer-aided detection. COMPARISON:  Previous exam(s). ACR Breast Density Category c: The breasts are heterogeneously dense, which may obscure small masses. FINDINGS: There are no findings suspicious for malignancy. IMPRESSION: No mammographic evidence of malignancy. A result letter of this screening mammogram will be mailed directly to the patient. RECOMMENDATION: Screening mammogram in one year. (Code:SM-B-01Y) BI-RADS CATEGORY  1: Negative. Electronically Signed   By: Frederico Hamman M.D.   On: 05/14/2023 15:25   DG BONE DENSITY (DXA)  Result Date: 05/13/2023 EXAM: DUAL X-RAY ABSORPTIOMETRY (DXA) FOR BONE MINERAL DENSITY IMPRESSION: Your patient Denise Maynard (software version: 14.10) manufactured by [employer]. The following summarizes the results of our evaluation. Technologist: [facility] PATIENT BIOGRAPHICAL: Name: Elbony, Sholl Patient ID: [id number] Birth Date: [birth year] Height: 65.0 in. Gender: Female Exam Date: 05/13/2023 Weight: 142.7 lbs. Indications: Caucasian, Post Menopausal, Tobacco User Fractures: Treatments: Asprin, Multivitamin, Vitamin D DENSITOMETRY RESULTS: Site      Region  Measured Date Measured Age WHO Classification Young Adult T-score BMD         %Change vs. Previous Significant Change (*) [facility] Spine L1-L2 05/13/2023 69.2 Osteoporosis -3.6 0.734 g/cm2 - - DualFemur Neck Left 05/13/2023 69.2 Osteoporosis -2.6 0.674 g/cm2 - - DualFemur Total Mean 05/13/2023 69.2 Osteopenia -2.4 0.705 g/cm2 - - ASSESSMENT: BMD as determined from [facility] Spine L1-L2 is 0.734 g/cm2 with a T-Score of -3.6. This patient is considered osteoporotic according to World Health Organization [facility]) criteria. The scan quality is good. L3 and L4 were excluded due to advanced degenerative changes. World Science writer [facility]) criteria for post-menopausal, Caucasian Women: Normal:       T-score at or above  -1 SD Osteopenia:   T-score between -1 and -2.5 SD Osteoporosis: T-score at or below -2.5 SD RECOMMENDATIONS: 1. All patients should optimize calcium and vitamin D intake. 2. Consider FDA-approved medical therapies in postmenopausal women and med aged 61 years and older, based on the following: a. A hip or vertebral (clinical or morphometric) fracture b. T-score< -2.5 at the femoral neck or spine after appropriate evaluation to exclude secondary causes c. Low bone mass (T-score between -1.0 and -2.5 at the femoral neck or spine) and a 10-year probability of a hip fracture > 3% or a 10-year probability of a major osteoporosis-related fracture > 20% based on the [place]-adapted WHO algorithm d. Clinician judgment and/or patient preferences may indicate treatment for people with 10-year fracture probabilities above or below these levels FOLLOW-UP: Patients with diagnosis of osteoporosis or at high risk fracture should have regular bone mineral density tests. For patients eligible for Medicare, routine testing is allowed once every 2 years. Testing frequency can be increased to on year for patients who have rapidly progressing disease, those who are receiving medical therapy to restore bone mass, or have additional risk factors. I have reviewed this report, and agree with the above findings. [facility] Electronically Signed   By: Frederico Hamman M.D.   On: 05/13/2023 10:24

## 2023-08-28 ENCOUNTER — Other Ambulatory Visit: Payer: Self-pay | Admitting: Hematology

## 2023-09-10 DIAGNOSIS — R6889 Other general symptoms and signs: Secondary | ICD-10-CM | POA: Diagnosis not present

## 2023-09-10 DIAGNOSIS — J069 Acute upper respiratory infection, unspecified: Secondary | ICD-10-CM | POA: Diagnosis not present

## 2023-09-10 DIAGNOSIS — Z6824 Body mass index (BMI) 24.0-24.9, adult: Secondary | ICD-10-CM | POA: Diagnosis not present

## 2023-09-11 ENCOUNTER — Encounter (HOSPITAL_COMMUNITY): Payer: Self-pay | Admitting: Emergency Medicine

## 2023-09-11 ENCOUNTER — Other Ambulatory Visit: Payer: Self-pay

## 2023-09-11 ENCOUNTER — Observation Stay (HOSPITAL_COMMUNITY)
Admission: EM | Admit: 2023-09-11 | Discharge: 2023-09-13 | Disposition: A | Discharge status: Home / Self Care | Payer: Medicare Other | Attending: Emergency Medicine | Admitting: Emergency Medicine

## 2023-09-11 ENCOUNTER — Emergency Department (HOSPITAL_COMMUNITY): Payer: Medicare Other

## 2023-09-11 DIAGNOSIS — Z8673 Personal history of transient ischemic attack (TIA), and cerebral infarction without residual deficits: Secondary | ICD-10-CM | POA: Insufficient documentation

## 2023-09-11 DIAGNOSIS — R531 Weakness: Secondary | ICD-10-CM | POA: Diagnosis not present

## 2023-09-11 DIAGNOSIS — E785 Hyperlipidemia, unspecified: Secondary | ICD-10-CM | POA: Diagnosis not present

## 2023-09-11 DIAGNOSIS — R0989 Other specified symptoms and signs involving the circulatory and respiratory systems: Secondary | ICD-10-CM | POA: Diagnosis not present

## 2023-09-11 DIAGNOSIS — F17210 Nicotine dependence, cigarettes, uncomplicated: Secondary | ICD-10-CM | POA: Insufficient documentation

## 2023-09-11 DIAGNOSIS — J929 Pleural plaque without asbestos: Secondary | ICD-10-CM | POA: Diagnosis not present

## 2023-09-11 DIAGNOSIS — R079 Chest pain, unspecified: Secondary | ICD-10-CM | POA: Diagnosis not present

## 2023-09-11 DIAGNOSIS — R059 Cough, unspecified: Secondary | ICD-10-CM | POA: Diagnosis not present

## 2023-09-11 DIAGNOSIS — D45 Polycythemia vera: Secondary | ICD-10-CM | POA: Diagnosis not present

## 2023-09-11 DIAGNOSIS — R0789 Other chest pain: Secondary | ICD-10-CM | POA: Diagnosis not present

## 2023-09-11 DIAGNOSIS — R6889 Other general symptoms and signs: Secondary | ICD-10-CM | POA: Diagnosis not present

## 2023-09-11 DIAGNOSIS — Z7982 Long term (current) use of aspirin: Secondary | ICD-10-CM | POA: Diagnosis not present

## 2023-09-11 DIAGNOSIS — J441 Chronic obstructive pulmonary disease with (acute) exacerbation: Principal | ICD-10-CM | POA: Diagnosis present

## 2023-09-11 DIAGNOSIS — Z20822 Contact with and (suspected) exposure to covid-19: Secondary | ICD-10-CM | POA: Diagnosis not present

## 2023-09-11 DIAGNOSIS — R0602 Shortness of breath: Secondary | ICD-10-CM | POA: Diagnosis present

## 2023-09-11 DIAGNOSIS — Z743 Need for continuous supervision: Secondary | ICD-10-CM | POA: Diagnosis not present

## 2023-09-11 DIAGNOSIS — F419 Anxiety disorder, unspecified: Secondary | ICD-10-CM | POA: Diagnosis not present

## 2023-09-11 DIAGNOSIS — J4489 Other specified chronic obstructive pulmonary disease: Secondary | ICD-10-CM | POA: Diagnosis present

## 2023-09-11 LAB — RESP PANEL BY RT-PCR (RSV, FLU A&B, COVID)  RVPGX2
Influenza A by PCR: NEGATIVE
Influenza B by PCR: NEGATIVE
Resp Syncytial Virus by PCR: NEGATIVE
SARS Coronavirus 2 by RT PCR: NEGATIVE

## 2023-09-11 LAB — BASIC METABOLIC PANEL
Anion gap: 14 (ref 5–15)
BUN: 17 mg/dL (ref 8–23)
CO2: 23 mmol/L (ref 22–32)
Calcium: 9.9 mg/dL (ref 8.9–10.3)
Chloride: 106 mmol/L (ref 98–111)
Creatinine, Ser: 0.77 mg/dL (ref 0.44–1.00)
GFR, Estimated: >60 mL/min (ref >60)
Glucose, Bld: 103 mg/dL — ABNORMAL HIGH (ref 70–99)
Potassium: 3.7 mmol/L (ref 3.5–5.1)
Sodium: 143 mmol/L (ref 135–145)

## 2023-09-11 LAB — CBC
HCT: 43.5 % (ref 36.0–46.0)
Hemoglobin: 14.4 g/dL (ref 12.0–15.0)
MCH: 34.2 pg — ABNORMAL HIGH (ref 26.0–34.0)
MCHC: 33.1 g/dL (ref 30.0–36.0)
MCV: 103.3 fL — ABNORMAL HIGH (ref 80.0–100.0)
Platelets: 310 10*3/uL (ref 150–400)
RBC: 4.21 MIL/uL (ref 3.87–5.11)
RDW: 14.4 % (ref 11.5–15.5)
WBC: 11.5 10*3/uL — ABNORMAL HIGH (ref 4.0–10.5)
nRBC: 0 % (ref 0.0–0.2)

## 2023-09-11 LAB — TROPONIN I (HIGH SENSITIVITY)
Troponin I (High Sensitivity): 6 ng/L (ref <18)
Troponin I (High Sensitivity): 8 ng/L (ref <18)

## 2023-09-11 MED ORDER — PREDNISONE 20 MG PO TABS
40.0000 mg | ORAL_TABLET | Freq: Once | ORAL | Status: AC
  Administered 2023-09-11: 40 mg via ORAL
  Filled 2023-09-11: qty 2

## 2023-09-11 MED ORDER — ALBUTEROL SULFATE (2.5 MG/3ML) 0.083% IN NEBU
INHALATION_SOLUTION | RESPIRATORY_TRACT | Status: AC
  Filled 2023-09-11: qty 3

## 2023-09-11 MED ORDER — IPRATROPIUM-ALBUTEROL 0.5-2.5 (3) MG/3ML IN SOLN
3.0000 mL | Freq: Once | RESPIRATORY_TRACT | Status: AC
  Administered 2023-09-11: 3 mL via RESPIRATORY_TRACT
  Filled 2023-09-11: qty 3

## 2023-09-11 NOTE — ED Triage Notes (Signed)
Pt here by [facility]> pt diagnosed w/ URI @ PCP yesterday. Pt c/o of chest tightness x2days.

## 2023-09-11 NOTE — ED Notes (Signed)
ED TO INPATIENT HANDOFF REPORT  ED Nurse Name and Phone #:   S Name/Age/Gender Denise Maynard 68 y.o. female Room/Bed: APA03/APA03  Code Status   Code Status: Prior  Home/SNF/Other Home Patient oriented to: self, place, time, and situation Is this baseline? Yes   Triage Complete: Triage complete  Chief Complaint COPD exacerbation (HCC) [J44.1]  Triage Note Pt here by [facility]> pt diagnosed w/ URI @ PCP yesterday. Pt c/o of chest tightness x2days.    Allergies No Known Allergies  Level of Care/Admitting Diagnosis ED Disposition     ED Disposition  Admit   Condition  --   Comment  Hospital Area: [facility] [[id number]]  Level of Care: Med-Surg [16]  Covid Evaluation: Confirmed COVID Negative  Diagnosis: COPD exacerbation [facility]) [[id number]]  Admitting Physician: Briscoe Deutscher [[id number]]  Attending Physician: Briscoe Deutscher [[id number]]          B Medical/Surgery History Past Medical History:  Diagnosis Date   Carotid artery aneurysm (HCC)    Coil embolization of a supraclinoid right internal carotid artery - Dr. Conchita Paris   Hyperlipidemia    Nicotine addiction 05/15/2015   Will try patch   Serum calcium elevated 11/19/2017   recheck   Serum potassium elevated 11/19/2017   Stroke [facility])    Past Surgical History:  Procedure Laterality Date   ABDOMINAL AORTAGRAM N/A 11/24/2014   Procedure: ABDOMINAL Ronny Flurry;  Surgeon: Sherren Kerns, MD;  Location: [facility] CATH LAB;  Service: Cardiovascular;  Laterality: N/A;   Aneursym repair  10/2014   COLONOSCOPY N/A 07/30/2015   Procedure: COLONOSCOPY;  Surgeon: West Bali, MD;  Location: [facility] ENDO SUITE;  Service: Endoscopy;  Laterality: N/A;  1:00 Pm - moved to 1:30 - office to notify   FRACTURE SURGERY     Jaw   LOWER EXTREMITY ANGIOGRAM  11/24/2014   Procedure: LOWER EXTREMITY ANGIOGRAM;  Surgeon: Sherren Kerns, MD;  Location: [facility] CATH LAB;  Service: Cardiovascular;;   MOUTH SURGERY     RADIOLOGY WITH ANESTHESIA  N/A 10/12/2014   Procedure: Embolization/arteriogram;  Surgeon: Lisbeth Renshaw, MD;  Location: [facility] OR;  Service: Radiology;  Laterality: N/A;     A IV Location/Drains/Wounds Patient Lines/Drains/Airways Status     Active Line/Drains/Airways     None            Intake/Output Last 24 hours No intake or output data in the 24 hours ending 09/11/23 2330  Labs/Imaging Results for orders placed or performed during the hospital encounter of 09/11/23 (from the past 48 hours)  Basic metabolic panel     Status: Abnormal   Collection Time: 09/11/23  6:00 PM  Result Value Ref Range   Sodium 143 135 - 145 mmol/L   Potassium 3.7 3.5 - 5.1 mmol/L   Chloride 106 98 - 111 mmol/L   CO2 23 22 - 32 mmol/L   Glucose, Bld 103 (H) 70 - 99 mg/dL    Comment: Glucose reference range applies only to samples taken after fasting for at least 8 hours.   BUN 17 8 - 23 mg/dL   Creatinine, Ser 4.25 0.44 - 1.00 mg/dL   Calcium 9.9 8.9 - 95.6 mg/dL   GFR, Estimated >38 >75 mL/min    Comment: (NOTE) Calculated using the CKD-EPI Creatinine Equation (2021)    Anion gap 14 5 - 15    Comment: Performed at [facility], [address], [place], [place] [address]  CBC     Status: Abnormal  Collection Time: 09/11/23  6:00 PM  Result Value Ref Range   WBC 11.5 (H) 4.0 - 10.5 K/uL   RBC 4.21 3.87 - 5.11 MIL/uL   Hemoglobin 14.4 12.0 - 15.0 g/dL   HCT 16.1 09.6 - 04.5 %   MCV 103.3 (H) 80.0 - 100.0 fL   MCH 34.2 (H) 26.0 - 34.0 pg   MCHC 33.1 30.0 - 36.0 g/dL   RDW 40.9 81.1 - 91.4 %   Platelets 310 150 - 400 K/uL   nRBC 0.0 0.0 - 0.2 %    Comment: Performed at [facility], [address], [place], [place] [address]  Troponin I (High Sensitivity)     Status: None   Collection Time: 09/11/23  6:00 PM  Result Value Ref Range   Troponin I (High Sensitivity) 6 <18 ng/L    Comment: (NOTE) Elevated high sensitivity troponin I (hsTnI) values and significant  changes across serial measurements may suggest ACS  but many other  chronic and acute conditions are known to elevate hsTnI results.  Refer to the "Links" section for chest pain algorithms and additional  guidance. Performed at [facility], [address] [place], [place] [address]   Troponin I (High Sensitivity)     Status: None   Collection Time: 09/11/23  7:41 PM  Result Value Ref Range   Troponin I (High Sensitivity) 8 <18 ng/L    Comment: (NOTE) Elevated high sensitivity troponin I (hsTnI) values and significant  changes across serial measurements may suggest ACS but many other  chronic and acute conditions are known to elevate hsTnI results.  Refer to the "Links" section for chest pain algorithms and additional  guidance. Performed at [facility], [address], [place], [place] [address]   Resp panel by RT-PCR (RSV, Flu A&B, Covid) Anterior Nasal Swab     Status: None   Collection Time: 09/11/23  8:54 PM   Specimen: Anterior Nasal Swab  Result Value Ref Range   SARS Coronavirus 2 by RT PCR NEGATIVE NEGATIVE    Comment: (NOTE) SARS-CoV-2 target nucleic acids are NOT DETECTED.  The SARS-CoV-2 RNA is generally detectable in upper respiratory specimens during the acute phase of infection. The lowest concentration of SARS-CoV-2 viral copies this assay can detect is 138 copies/mL. A negative result does not preclude SARS-Cov-2 infection and should not be used as the sole basis for treatment or other patient management decisions. A negative result may occur with  improper specimen collection/handling, submission of specimen other than nasopharyngeal swab, presence of viral mutation(s) within the areas targeted by this assay, and inadequate number of viral copies(<138 copies/mL). A negative result must be combined with clinical observations, patient history, and epidemiological information. The expected result is Negative.  Fact Sheet for Patients:  BloggerCourse.com  Fact Sheet for Healthcare  Providers:  SeriousBroker.it  This test is no t yet approved or cleared by the [place] FDA and  has been authorized for detection and/or diagnosis of SARS-CoV-2 by FDA under an Emergency Use Authorization (EUA). This EUA will remain  in effect (meaning this test can be used) for the duration of the COVID-19 declaration under Section 564(b)(1) of the Act, 21 U.S.C.section 360bbb-3(b)(1), unless the authorization is terminated  or revoked sooner.       Influenza A by PCR NEGATIVE NEGATIVE   Influenza B by PCR NEGATIVE NEGATIVE    Comment: (NOTE) The Xpert Xpress SARS-CoV-2/FLU/RSV plus assay is intended as an aid in the diagnosis of influenza from Nasopharyngeal swab  specimens and should not be used as a sole basis for treatment. Nasal washings and aspirates are unacceptable for Xpert Xpress SARS-CoV-2/FLU/RSV testing.  Fact Sheet for Patients: BloggerCourse.com  Fact Sheet for Healthcare Providers: SeriousBroker.it  This test is not yet approved or cleared by the [place] FDA and has been authorized for detection and/or diagnosis of SARS-CoV-2 by FDA under an Emergency Use Authorization (EUA). This EUA will remain in effect (meaning this test can be used) for the duration of the COVID-19 declaration under Section 564(b)(1) of the Act, 21 U.S.C. section 360bbb-3(b)(1), unless the authorization is terminated or revoked.     Resp Syncytial Virus by PCR NEGATIVE NEGATIVE    Comment: (NOTE) Fact Sheet for Patients: BloggerCourse.com  Fact Sheet for Healthcare Providers: SeriousBroker.it  This test is not yet approved or cleared by the [place] FDA and has been authorized for detection and/or diagnosis of SARS-CoV-2 by FDA under an Emergency Use Authorization (EUA). This EUA will remain in effect (meaning this test can be used) for the  duration of the COVID-19 declaration under Section 564(b)(1) of the Act, 21 U.S.C. section 360bbb-3(b)(1), unless the authorization is terminated or revoked.  Performed at [facility], [address], [place], [place] [address]    DG Chest 2 View Result Date: 09/11/2023 CLINICAL DATA:  Chest pain, congestion and dry cough for 2 days EXAM: CHEST - 2 VIEW COMPARISON:  X-ray 11/12/2014.  CT 05/21/2023. FINDINGS: Hyperinflation. Apical pleural thickening. No consolidation, pneumothorax or effusion. No edema. Normal cardiopericardial silhouette. Overlapping cardiac leads. Degenerative changes of the spine on lateral view. Curvature of the spine as well. Prior CT scan described areas of nodularity and distortion. These are not as well appreciated on this x-ray. IMPRESSION: Hyperinflation. Chronic changes. No acute consolidation or effusion. Electronically Signed   By: Karen Kays M.D.   On: 09/11/2023 18:36    Pending Labs Unresulted Labs (From admission, onward)    None       Vitals/Pain Today's Vitals   09/11/23 2115 09/11/23 2130 09/11/23 2145 09/11/23 2200  BP: (!) 146/72 136/68 (!) 146/69   Pulse: 63 60 71   Resp: (!) 25 (!) 24 (!) 21   Temp:      TempSrc:      SpO2: 94% 92% 92% 92%  Weight:      Height:      PainSc:        Isolation Precautions No active isolations  Medications Medications  ipratropium-albuterol (DUONEB) 0.5-2.5 (3) MG/3ML nebulizer solution 3 mL (3 mLs Nebulization Given 09/11/23 2050)  predniSONE (DELTASONE) tablet 40 mg (40 mg Oral Given 09/11/23 2050)  ipratropium-albuterol (DUONEB) 0.5-2.5 (3) MG/3ML nebulizer solution 3 mL (3 mLs Nebulization Given 09/11/23 2200)  albuterol (PROVENTIL) (2.5 MG/3ML) 0.083% nebulizer solution (  Given 09/11/23 2215)    Mobility walks     Focused Assessments    R Recommendations: See Admitting Provider Note  Report given to:   Additional Notes:

## 2023-09-11 NOTE — ED Provider Notes (Signed)
[facility] EMERGENCY DEPARTMENT AT [facility] Provider Note   CSN: [id number] Arrival date & time: 09/11/23  1745     History  Chief Complaint  Patient presents with   Chest Pain    Denise Maynard is a 68 y.o. female.  She reports history of COPD, still a smoker, not on any inhalers at home.  Presents to the ER today complaining of tightness in the chest worse with 3 days.  She states that she has had a cough and congestion for about a week, over the past couple of days it has been worse.  She went to her PCP yesterday who told her she likely had a URI and prescribed a Z-Pak.  She still having the same symptoms today and was worried.  She states her arms and legs feel weak bilaterally, denies numbness or tingling.  She denies shortness of breath, denies diaphoresis, denies exertional symptoms.  She states she has not seen cardiologist but is supposed to follow-up because recent CT of her chest showed calcifications.  Family at bedside notes that while in the ED patient had shaking of her bilateral legs, patient denies chills or shivering, states it lasted a couple minutes and felt like her legs were "jumping".  Has not had this in the past, has not resolved at this time.  Denies any other abnormal movements.   Chest Pain      Home Medications Prior to Admission medications   Medication Sig Start Date End Date Taking? Authorizing Provider  acetaminophen (TYLENOL) 500 MG tablet Take 1,000 mg by mouth every 6 (six) hours as needed for mild pain or moderate pain.   Yes [provider]  alendronate (FOSAMAX) 70 MG tablet Take 70 mg by mouth once a week. 05/18/23  Yes [provider]  ALPRAZolam Prudy Feeler) 0.5 MG tablet Take 1 tablet (0.5 mg total) by mouth at bedtime. Patient taking differently: Take 0.25 mg by mouth at bedtime as needed for sleep or anxiety. 05/15/21  Yes Doreatha Massed, MD  Artificial Tear Ointment (DRY EYES OP) Apply 1 drop to eye daily.    Yes [provider]  aspirin EC 81 MG tablet Take 81 mg by mouth daily.   Yes [provider]  azithromycin (ZITHROMAX) 250 MG tablet Take 250 mg by mouth daily. Z-pack   Yes [provider]  benzonatate (TESSALON) 200 MG capsule Take 200 mg by mouth 3 (three) times daily as needed for cough.   Yes [provider]  bismuth subsalicylate (PEPTO BISMOL) 262 MG/15ML suspension Take 30 mLs by mouth every 6 (six) hours as needed for indigestion or diarrhea or loose stools.   Yes [provider]  Calcium Carb-Cholecalciferol (CALCIUM+D3) 500-15 MG-MCG TABS Take 1 tablet by mouth daily.   Yes [provider]  Cholecalciferol (VITAMIN D) 50 MCG (2000 UT) tablet Take 2,000 Units by mouth daily.   Yes [provider]  hydroxyurea (HYDREA) 500 MG capsule TAKE 1 CAPSULE BY MOUTH DAILY  MAY TAKE WITH FOOD TO MINIMIZE  GI SIDE EFFECTS Patient taking differently: Take 500 mg by mouth daily. Monday through Friday 08/31/23  Yes Doreatha Massed, MD  Multiple Vitamins-Minerals (MULTIVITAMIN WITH MINERALS) tablet Take 1 tablet by mouth daily.   Yes [provider]  Omega-3 Fatty Acids (FISH OIL) 500 MG CAPS Take 500 mg by mouth once.    Yes [provider]  PSEUDOEPH-BROMPHEN-HYDROCODONE PO Take 7.5 mLs by mouth 2 (two) times daily as needed (cough).  Yes [provider]  rosuvastatin (CRESTOR) 5 MG tablet Take 5 mg by mouth daily. 02/25/22  Yes [provider]      Allergies    Patient has no known allergies.    Review of Systems   Review of Systems  Cardiovascular:  Positive for chest pain.    Physical Exam Updated Vital Signs BP 119/68   Pulse 64   Temp 98.4 F (36.9 C) (Oral)   Resp 19   Ht 5\' 5"  (1.651 m)   Wt 62.6 kg   SpO2 90%   BMI 22.96 kg/m  Physical Exam Vitals and nursing note reviewed.  Constitutional:      General: She is not in acute distress.    Appearance: She is well-developed.   HENT:     Head: Normocephalic and atraumatic.  Eyes:     Conjunctiva/sclera: Conjunctivae normal.  Cardiovascular:     Rate and Rhythm: Normal rate and regular rhythm.     Heart sounds: No murmur heard. Pulmonary:     Effort: Pulmonary effort is normal. No respiratory distress.     Breath sounds: Examination of the right-upper field reveals wheezing. Examination of the left-upper field reveals wheezing. Examination of the right-middle field reveals wheezing. Examination of the left-middle field reveals wheezing. Examination of the right-lower field reveals wheezing. Examination of the left-lower field reveals wheezing. Wheezing present.  Abdominal:     Palpations: Abdomen is soft.     Tenderness: There is no abdominal tenderness.  Musculoskeletal:        General: No swelling.     Cervical back: Neck supple.     Right lower leg: No tenderness. No edema.     Left lower leg: No tenderness. No edema.  Skin:    General: Skin is warm and dry.     Capillary Refill: Capillary refill takes less than 2 seconds.  Neurological:     General: No focal deficit present.     Mental Status: She is alert and oriented to person, place, and time.  Psychiatric:        Mood and Affect: Mood normal.     ED Results / Procedures / Treatments   Labs (all labs ordered are listed, but only abnormal results are displayed) Labs Reviewed  BASIC METABOLIC PANEL - Abnormal; Notable for the following components:      Result Value   Glucose, Bld 103 (*)    All other components within normal limits  CBC - Abnormal; Notable for the following components:   WBC 11.5 (*)    MCV 103.3 (*)    MCH 34.2 (*)    All other components within normal limits  RESP PANEL BY RT-PCR (RSV, FLU A&B, COVID)  [id number]  TROPONIN I (HIGH SENSITIVITY)  TROPONIN I (HIGH SENSITIVITY)    EKG EKG Interpretation Date/Time:  Friday September 11 2023 17:53:22 EST Ventricular Rate:  66 PR Interval:  132 QRS Duration:  87 QT  Interval:  396 QTC Calculation: 415 R Axis:   62  Text Interpretation: Sinus rhythm Nonspecific T abnormalities, lateral leads Confirmed by Bethann Berkshire [id number]) on 09/11/2023 9:40:48 PM  Radiology DG Chest 2 View Result Date: 09/11/2023 CLINICAL DATA:  Chest pain, congestion and dry cough for 2 days EXAM: CHEST - 2 VIEW COMPARISON:  X-ray 11/12/2014.  CT 05/21/2023. FINDINGS: Hyperinflation. Apical pleural thickening. No consolidation, pneumothorax or effusion. No edema. Normal cardiopericardial silhouette. Overlapping cardiac leads. Degenerative changes of the spine on lateral view. Curvature of the spine as  well. Prior CT scan described areas of nodularity and distortion. These are not as well appreciated on this x-ray. IMPRESSION: Hyperinflation. Chronic changes. No acute consolidation or effusion. Electronically Signed   By: Karen Kays M.D.   On: 09/11/2023 18:36    Procedures Procedures    Medications Ordered in ED Medications  ipratropium-albuterol (DUONEB) 0.5-2.5 (3) MG/3ML nebulizer solution 3 mL (3 mLs Nebulization Given 09/11/23 2050)  predniSONE (DELTASONE) tablet 40 mg (40 mg Oral Given 09/11/23 2050)  ipratropium-albuterol (DUONEB) 0.5-2.5 (3) MG/3ML nebulizer solution 3 mL (3 mLs Nebulization Given 09/11/23 2200)  albuterol (PROVENTIL) (2.5 MG/3ML) 0.083% nebulizer solution (  Given 09/11/23 2215)    ED Course/ Medical Decision Making/ A&P                                 Medical Decision Making This patient presents to the ED for concern of shortness of breath and chest tightness, this involves an extensive number of treatment options, and is a complaint that carries with it a high risk of complications and morbidity.  The differential diagnosis includes pneumonia, bronchitis, COPD exacerbation, ACS, PE, other   Co morbidities that complicate the patient evaluation :   COPD, tobacco abuse   Additional history obtained:  Additional history obtained from EMR External  records from outside source obtained and reviewed including prior notes and imaging   Lab Tests:  I Ordered, and personally interpreted labs.  The pertinent results include: Troponin negative with delta of 2, flu COVID RSV swab negative, CBC and BMP are reassuring   Imaging Studies ordered:  I ordered imaging studies including x-ray which shows reinflation but no consolidation no pneumothorax I independently visualized and interpreted imaging within scope of identifying emergent findings  I agree with the radiologist interpretation   Cardiac Monitoring: / EKG:  The patient was maintained on a cardiac monitor.  I personally viewed and interpreted the cardiac monitored which showed an underlying rhythm of: Sinus rhythm   Consultations Obtained:  I requested consultation with the plus,  and discussed lab and imaging findings as well as pertinent plan - they recommend: Admission for dyspnea   Problem List / ED Course / Critical interventions / Medication management  Tightness and shortness of breath-patient has COPD, not on home oxygen, still smoking, not taking any inhalers.  Patient was diffusely wheezy, had improvement after 2 DuoNebs and steroids but still having some mild tightness and shortness of breath.  Patient having generalized weakness, becomes very dyspneic on ambulation, required 2 people to help steady her with ambulation, saturations did not drop below 90% but patient got very short of breath with walking.  Given thi, I cannot discharge her home safely patient be admitted to the hospital.  She has no PE risk factors and is not having pleuritic pain, doubt PE. Discussed with hospitalist for admission.  Also noted to have some tremors in her extremities that are not present when she is moving her extremities intentionally, these could worsen with albuterol.  She has no neurologic deficits.  Discussed that could be side effect of the beta agonists, may need outpatient follow-up  if this is persistent I ordered medication including DuoNeb for shortness of breath and wheezing Reevaluation of the patient after these medicines showed that the patient improved I have reviewed the patients home medicines and have made adjustments as needed       Amount and/or Complexity of Data Reviewed  Labs: ordered. Radiology: ordered.  Risk Prescription drug management. Decision regarding hospitalization.           Final Clinical Impression(s) / ED Diagnoses Final diagnoses:  COPD exacerbation [facility])    Rx / DC Orders ED Discharge Orders     None         Josem Kaufmann 09/11/23 2343    Bethann Berkshire, MD 09/13/23 [phone]

## 2023-09-12 ENCOUNTER — Encounter (HOSPITAL_COMMUNITY): Payer: Self-pay | Admitting: Family Medicine

## 2023-09-12 DIAGNOSIS — J441 Chronic obstructive pulmonary disease with (acute) exacerbation: Secondary | ICD-10-CM | POA: Diagnosis not present

## 2023-09-12 LAB — CBC
HCT: 35.7 % — ABNORMAL LOW (ref 36.0–46.0)
Hemoglobin: 12.2 g/dL (ref 12.0–15.0)
MCH: 35.3 pg — ABNORMAL HIGH (ref 26.0–34.0)
MCHC: 34.2 g/dL (ref 30.0–36.0)
MCV: 103.2 fL — ABNORMAL HIGH (ref 80.0–100.0)
Platelets: 272 10*3/uL (ref 150–400)
RBC: 3.46 MIL/uL — ABNORMAL LOW (ref 3.87–5.11)
RDW: 14.4 % (ref 11.5–15.5)
WBC: 8.7 10*3/uL (ref 4.0–10.5)
nRBC: 0 % (ref 0.0–0.2)

## 2023-09-12 LAB — BASIC METABOLIC PANEL
Anion gap: 9 (ref 5–15)
BUN: 18 mg/dL (ref 8–23)
CO2: 23 mmol/L (ref 22–32)
Calcium: 8.6 mg/dL — ABNORMAL LOW (ref 8.9–10.3)
Chloride: 107 mmol/L (ref 98–111)
Creatinine, Ser: 0.59 mg/dL (ref 0.44–1.00)
GFR, Estimated: >60 mL/min (ref >60)
Glucose, Bld: 121 mg/dL — ABNORMAL HIGH (ref 70–99)
Potassium: 3.5 mmol/L (ref 3.5–5.1)
Sodium: 139 mmol/L (ref 135–145)

## 2023-09-12 LAB — HIV ANTIBODY (ROUTINE TESTING W REFLEX): HIV Screen 4th Generation wRfx: NONREACTIVE

## 2023-09-12 MED ORDER — AZITHROMYCIN 250 MG PO TABS
250.0000 mg | ORAL_TABLET | Freq: Every day | ORAL | Status: DC
  Administered 2023-09-12 – 2023-09-13 (×2): 250 mg via ORAL
  Filled 2023-09-12 (×2): qty 1

## 2023-09-12 MED ORDER — ACETAMINOPHEN 325 MG PO TABS: 650.0000 mg | ORAL_TABLET | Freq: Four times a day (QID) | ORAL | Status: DC | PRN

## 2023-09-12 MED ORDER — ONDANSETRON HCL 4 MG/2ML IJ SOLN: 4.0000 mg | Freq: Four times a day (QID) | INTRAMUSCULAR | Status: DC | PRN

## 2023-09-12 MED ORDER — SENNOSIDES-DOCUSATE SODIUM 8.6-50 MG PO TABS: 1.0000 | ORAL_TABLET | Freq: Every evening | ORAL | Status: DC | PRN

## 2023-09-12 MED ORDER — GUAIFENESIN 100 MG/5ML PO LIQD: 5.0000 mL | ORAL | Status: DC | PRN

## 2023-09-12 MED ORDER — IPRATROPIUM-ALBUTEROL 0.5-2.5 (3) MG/3ML IN SOLN
3.0000 mL | Freq: Three times a day (TID) | RESPIRATORY_TRACT | Status: DC
  Administered 2023-09-12: 3 mL via RESPIRATORY_TRACT
  Filled 2023-09-12: qty 3

## 2023-09-12 MED ORDER — ALBUTEROL SULFATE (2.5 MG/3ML) 0.083% IN NEBU: 2.5000 mg | INHALATION_SOLUTION | RESPIRATORY_TRACT | Status: DC | PRN

## 2023-09-12 MED ORDER — ENOXAPARIN SODIUM 40 MG/0.4ML IJ SOSY
40.0000 mg | PREFILLED_SYRINGE | INTRAMUSCULAR | Status: DC
  Administered 2023-09-12 – 2023-09-13 (×2): 40 mg via SUBCUTANEOUS
  Filled 2023-09-12 (×2): qty 0.4

## 2023-09-12 MED ORDER — ACETAMINOPHEN 650 MG RE SUPP: 650.0000 mg | Freq: Four times a day (QID) | RECTAL | Status: DC | PRN

## 2023-09-12 MED ORDER — IPRATROPIUM-ALBUTEROL 0.5-2.5 (3) MG/3ML IN SOLN
3.0000 mL | Freq: Three times a day (TID) | RESPIRATORY_TRACT | Status: DC
  Administered 2023-09-12 – 2023-09-13 (×3): 3 mL via RESPIRATORY_TRACT
  Filled 2023-09-12 (×3): qty 3

## 2023-09-12 MED ORDER — PREDNISONE 20 MG PO TABS
40.0000 mg | ORAL_TABLET | Freq: Every day | ORAL | Status: DC
  Administered 2023-09-13: 40 mg via ORAL
  Filled 2023-09-12: qty 2

## 2023-09-12 MED ORDER — METHYLPREDNISOLONE SODIUM SUCC 125 MG IJ SOLR
125.0000 mg | Freq: Two times a day (BID) | INTRAMUSCULAR | Status: AC
  Administered 2023-09-12 (×2): 125 mg via INTRAVENOUS
  Filled 2023-09-12 (×2): qty 2

## 2023-09-12 MED ORDER — ALPRAZOLAM 0.5 MG PO TABS
0.2500 mg | ORAL_TABLET | Freq: Every evening | ORAL | Status: DC | PRN
  Administered 2023-09-12: 0.25 mg via ORAL
  Filled 2023-09-12: qty 1

## 2023-09-12 MED ORDER — HYDROXYUREA 500 MG PO CAPS: 500.0000 mg | ORAL_CAPSULE | Freq: Every day | ORAL | Status: DC

## 2023-09-12 MED ORDER — BUDESONIDE 0.5 MG/2ML IN SUSP
0.5000 mg | Freq: Two times a day (BID) | RESPIRATORY_TRACT | Status: DC
  Administered 2023-09-12 – 2023-09-13 (×3): 0.5 mg via RESPIRATORY_TRACT
  Filled 2023-09-12 (×3): qty 2

## 2023-09-12 MED ORDER — ASPIRIN 81 MG PO TBEC
81.0000 mg | DELAYED_RELEASE_TABLET | Freq: Every day | ORAL | Status: DC
  Administered 2023-09-12 – 2023-09-13 (×2): 81 mg via ORAL
  Filled 2023-09-12 (×2): qty 1

## 2023-09-12 MED ORDER — BISMUTH SUBSALICYLATE 262 MG/15ML PO SUSP: 30.0000 mL | Freq: Four times a day (QID) | ORAL | Status: DC | PRN

## 2023-09-12 MED ORDER — ONDANSETRON HCL 4 MG PO TABS: 4.0000 mg | ORAL_TABLET | Freq: Four times a day (QID) | ORAL | Status: DC | PRN

## 2023-09-12 MED ORDER — ALPRAZOLAM 0.25 MG PO TABS
0.2500 mg | ORAL_TABLET | Freq: Four times a day (QID) | ORAL | Status: DC | PRN
  Administered 2023-09-12 (×2): 0.25 mg via ORAL
  Filled 2023-09-12 (×2): qty 1

## 2023-09-12 MED ORDER — ARFORMOTEROL TARTRATE 15 MCG/2ML IN NEBU
15.0000 ug | INHALATION_SOLUTION | Freq: Two times a day (BID) | RESPIRATORY_TRACT | Status: DC
  Administered 2023-09-12 – 2023-09-13 (×3): 15 ug via RESPIRATORY_TRACT
  Filled 2023-09-12 (×3): qty 2

## 2023-09-12 MED ORDER — POTASSIUM CHLORIDE CRYS ER 20 MEQ PO TBCR
40.0000 meq | EXTENDED_RELEASE_TABLET | Freq: Once | ORAL | Status: AC
  Administered 2023-09-12: 40 meq via ORAL
  Filled 2023-09-12: qty 2

## 2023-09-12 MED ORDER — ROSUVASTATIN CALCIUM 10 MG PO TABS
5.0000 mg | ORAL_TABLET | Freq: Every day | ORAL | Status: DC
  Administered 2023-09-12 – 2023-09-13 (×2): 5 mg via ORAL
  Filled 2023-09-12 (×2): qty 1

## 2023-09-12 MED ORDER — NICOTINE 21 MG/24HR TD PT24
21.0000 mg | MEDICATED_PATCH | Freq: Every day | TRANSDERMAL | Status: DC
  Administered 2023-09-12 – 2023-09-13 (×2): 21 mg via TRANSDERMAL
  Filled 2023-09-12 (×2): qty 1

## 2023-09-12 NOTE — Progress Notes (Signed)
Patient arrived from the ED to room 319 via w/c accompanied by family.  Patient is alert & oriented x 4.  Patient is currently on 2L Oxygen via [place].  Patient was oriented to the room and instructed to call from assistance.

## 2023-09-12 NOTE — Progress Notes (Signed)
Prior-To-Admission Oral Chemotherapy for Treatment of Oncologic Disease   Order noted from Dr. Antionette Char to continue prior-to-admission oral chemotherapy regimen of Hydrea.  Procedure Per Pharmacy & Therapeutics Committee Policy: Orders for continuation of home oral chemotherapy for treatment of an oncologic disease will be held unless approved by an oncologist during current admission.    For patients receiving oncology care at [facility], inpatient pharmacist contacts patient's oncologist during regular office hours to review. If earlier review is medically necessary, attending physician consults [facility] on-call oncologist   For patients receiving oncology care outside of [facility], attending physician consults patient's oncologist to review. If this oncologist or their coverage cannot be reached, attending physician consults [facility] on-call oncologist   Oral chemotherapy continuation order is on hold pending oncologist review, [facility] oncologist Ellin Saba will be notified by inpatient pharmacy during office hours.   Hospitalist notified that they would need to reach out to oncologist directly over weekend if medication needed.    Junita Push PharmD 09/12/2023, 1:48 AM

## 2023-09-12 NOTE — H&P (Signed)
History and Physical    Denise Maynard JXB:[id number] DOB: [birth year] DOA: 09/11/2023  PCP: Assunta Found, MD   Patient coming from: Home   Chief Complaint: Cough, congestion, SOB, chest tightness   HPI: Denise Maynard is a 68 y.o. female with medical history significant for COPD, polycythemia vera, and current smoker who presents with cough, shortness of breath, congestion, and chest tightness.    Symptoms began almost a week ago.  Shortness of breath and sensation of chest tightness has been worsening over the past 3 days.  She occasionally produces scant white sputum breath the cough is mainly nonproductive.  She was seen at an outpatient clinic yesterday where she was diagnosed with URI and prescribed azithromycin.  ED Course: Upon arrival to the ED, patient is found to be afebrile and saturating mid 90s on room air with tachypnea, normal heart rate, and stable blood pressure.  Labs are notable for normal renal function, normal troponin x 2, and negative respiratory virus panel.  No acute findings are seen on chest x-ray.  Patient was treated with systemic steroids, breathing treatments, and supplemental oxygen in the ED.  Review of Systems:  All other systems reviewed and apart from HPI, are negative.  Past Medical History:  Diagnosis Date   Carotid artery aneurysm (HCC)    Coil embolization of a supraclinoid right internal carotid artery - Dr. Conchita Paris   Hyperlipidemia    Nicotine addiction 05/15/2015   Will try patch   Serum calcium elevated 11/19/2017   recheck   Serum potassium elevated 11/19/2017   Stroke [facility])     Past Surgical History:  Procedure Laterality Date   ABDOMINAL AORTAGRAM N/A 11/24/2014   Procedure: ABDOMINAL Ronny Flurry;  Surgeon: Sherren Kerns, MD;  [facility];  Service: Cardiovascular;  Laterality: N/A;   Aneursym repair  10/2014   COLONOSCOPY N/A 07/30/2015   Procedure: COLONOSCOPY;  Surgeon: West Bali, MD;  Location: [facility] ENDO SUITE;   Service: Endoscopy;  Laterality: N/A;  1:00 Pm - moved to 1:30 - office to notify   FRACTURE SURGERY     Jaw   LOWER EXTREMITY ANGIOGRAM  11/24/2014   Procedure: LOWER EXTREMITY ANGIOGRAM;  Surgeon: Sherren Kerns, MD;  Location: [facility] CATH LAB;  Service: Cardiovascular;;   MOUTH SURGERY     RADIOLOGY WITH ANESTHESIA N/A 10/12/2014   Procedure: Embolization/arteriogram;  Surgeon: Lisbeth Renshaw, MD;  Location: [facility] OR;  Service: Radiology;  Laterality: N/A;    Social History:   reports that she has been smoking cigarettes. She has a 15 pack-year smoking history. She has never used smokeless tobacco. She reports that she does not drink alcohol and does not use drugs.  No Known Allergies  Family History  Problem Relation Age of Onset   Heart attack Mother    Diabetes Mother    Throat cancer Father    Hypertension Father    Hyperlipidemia Sister    Diabetes Maternal Grandmother    Cancer Maternal Grandfather    Heart attack Paternal Grandfather    Hyperlipidemia Sister      Prior to Admission medications   Medication Sig Start Date End Date Taking? Authorizing Provider  acetaminophen (TYLENOL) 500 MG tablet Take 1,000 mg by mouth every 6 (six) hours as needed for mild pain or moderate pain.   Yes [provider]  alendronate (FOSAMAX) 70 MG tablet Take 70 mg by mouth once a week. 05/18/23  Yes [provider]  ALPRAZolam Prudy Feeler) 0.5 MG tablet  Take 1 tablet (0.5 mg total) by mouth at bedtime. Patient taking differently: Take 0.25 mg by mouth at bedtime as needed for sleep or anxiety. 05/15/21  Yes Doreatha Massed, MD  Artificial Tear Ointment (DRY EYES OP) Apply 1 drop to eye daily.   Yes [provider]  aspirin EC 81 MG tablet Take 81 mg by mouth daily.   Yes [provider]  azithromycin (ZITHROMAX) 250 MG tablet Take 250 mg by mouth daily. Z-pack   Yes [provider]  benzonatate (TESSALON) 200 MG capsule Take 200 mg by mouth 3  (three) times daily as needed for cough.   Yes [provider]  bismuth subsalicylate (PEPTO BISMOL) 262 MG/15ML suspension Take 30 mLs by mouth every 6 (six) hours as needed for indigestion or diarrhea or loose stools.   Yes [provider]  Calcium Carb-Cholecalciferol (CALCIUM+D3) 500-15 MG-MCG TABS Take 1 tablet by mouth daily.   Yes [provider]  Cholecalciferol (VITAMIN D) 50 MCG (2000 UT) tablet Take 2,000 Units by mouth daily.   Yes [provider]  hydroxyurea (HYDREA) 500 MG capsule TAKE 1 CAPSULE BY MOUTH DAILY  MAY TAKE WITH FOOD TO MINIMIZE  GI SIDE EFFECTS Patient taking differently: Take 500 mg by mouth daily. Monday through Friday 08/31/23  Yes Doreatha Massed, MD  Multiple Vitamins-Minerals (MULTIVITAMIN WITH MINERALS) tablet Take 1 tablet by mouth daily.   Yes [provider]  Omega-3 Fatty Acids (FISH OIL) 500 MG CAPS Take 500 mg by mouth once.    Yes [provider]  PSEUDOEPH-BROMPHEN-HYDROCODONE PO Take 7.5 mLs by mouth 2 (two) times daily as needed (cough).   Yes [provider]  rosuvastatin (CRESTOR) 5 MG tablet Take 5 mg by mouth daily. 02/25/22  Yes [provider]    Physical Exam: Vitals:   09/11/23 2245 09/11/23 2300 09/11/23 2315 09/11/23 2345  BP: (!) 124/53 129/64 119/68 (!) 127/57  Pulse: 64 65 64 66  Resp: (!) 24 (!) 22 19 20   Temp:      TempSrc:      SpO2: 92% 91% 90% 95%  Weight:      Height:        Constitutional: NAD, no pallor or diaphoresis   Eyes: PERTLA, lids and conjunctivae normal ENMT: Mucous membranes are moist. Posterior pharynx clear of any exudate or lesions.   Neck: supple, no masses  Respiratory: Diminished bilaterally with prolonged expiratory phase and occasional wheeze. Dyspnea with speech.   Cardiovascular: S1 & S2 heard, regular rate and rhythm. No extremity edema.  Abdomen: No distension, no tenderness, soft. Bowel sounds active.  Musculoskeletal: no  clubbing / cyanosis. No joint deformity upper and lower extremities.   Skin: no significant rashes, lesions, ulcers. Warm, dry, well-perfused. Neurologic: CN 2-12 grossly intact. Moving all extremities. Alert and oriented.  Psychiatric: Calm. Cooperative.    Labs and Imaging on Admission: I have personally reviewed following labs and imaging studies  CBC: Recent Labs  Lab 09/11/23 1800  WBC 11.5*  HGB 14.4  HCT 43.5  MCV 103.3*  PLT 310   Basic Metabolic Panel: Recent Labs  Lab 09/11/23 1800  NA 143  K 3.7  CL 106  CO2 23  GLUCOSE 103*  BUN 17  CREATININE 0.77  CALCIUM 9.9   GFR: Estimated Creatinine Clearance: 59.7 mL/min (by C-G formula based on SCr of 0.77 mg/dL). Liver Function Tests: No results for input(s): "AST", "ALT", "ALKPHOS", "BILITOT", "PROT", "ALBUMIN" in the last 168 hours. No  results for input(s): "LIPASE", "AMYLASE" in the last 168 hours. No results for input(s): "AMMONIA" in the last 168 hours. Coagulation Profile: No results for input(s): "INR", "PROTIME" in the last 168 hours. Cardiac Enzymes: No results for input(s): "CKTOTAL", "CKMB", "CKMBINDEX", "TROPONINI" in the last 168 hours. BNP (last 3 results) No results for input(s): "PROBNP" in the last 8760 hours. HbA1C: No results for input(s): "HGBA1C" in the last 72 hours. CBG: No results for input(s): "GLUCAP" in the last 168 hours. Lipid Profile: No results for input(s): "CHOL", "HDL", "LDLCALC", "TRIG", "CHOLHDL", "LDLDIRECT" in the last 72 hours. Thyroid Function Tests: No results for input(s): "TSH", "T4TOTAL", "FREET4", "T3FREE", "THYROIDAB" in the last 72 hours. Anemia Panel: No results for input(s): "VITAMINB12", "FOLATE", "FERRITIN", "TIBC", "IRON", "RETICCTPCT" in the last 72 hours. Urine analysis:    Component Value Date/Time   COLORURINE STRAW (A) 12/04/2017 0927   APPEARANCEUR CLEAR 12/04/2017 0927   LABSPEC 1.002 (L) 12/04/2017 0927   PHURINE 6.0 12/04/2017 0927   GLUCOSEU  NEGATIVE 12/04/2017 0927   HGBUR NEGATIVE 12/04/2017 0927   BILIRUBINUR NEGATIVE 12/04/2017 0927   KETONESUR NEGATIVE 12/04/2017 0927   PROTEINUR NEGATIVE 12/04/2017 0927   NITRITE NEGATIVE 12/04/2017 0927   LEUKOCYTESUR NEGATIVE 12/04/2017 0927   Sepsis Labs: @LABRCNTIP (procalcitonin:4,lacticidven:4) ) Recent Results (from the past 240 hours)  Resp panel by RT-PCR (RSV, Flu A&B, Covid) Anterior Nasal Swab     Status: None   Collection Time: 09/11/23  8:54 PM   Specimen: Anterior Nasal Swab  Result Value Ref Range Status   SARS Coronavirus 2 by RT PCR NEGATIVE NEGATIVE Final    Comment: (NOTE) SARS-CoV-2 target nucleic acids are NOT DETECTED.  The SARS-CoV-2 RNA is generally detectable in upper respiratory specimens during the acute phase of infection. The lowest concentration of SARS-CoV-2 viral copies this assay can detect is 138 copies/mL. A negative result does not preclude SARS-Cov-2 infection and should not be used as the sole basis for treatment or other patient management decisions. A negative result may occur with  improper specimen collection/handling, submission of specimen other than nasopharyngeal swab, presence of viral mutation(s) within the areas targeted by this assay, and inadequate number of viral copies(<138 copies/mL). A negative result must be combined with clinical observations, patient history, and epidemiological information. The expected result is Negative.  Fact Sheet for Patients:  BloggerCourse.com  Fact Sheet for Healthcare Providers:  SeriousBroker.it  This test is no t yet approved or cleared by the [place] FDA and  has been authorized for detection and/or diagnosis of SARS-CoV-2 by FDA under an Emergency Use Authorization (EUA). This EUA will remain  in effect (meaning this test can be used) for the duration of the COVID-19 declaration under Section 564(b)(1) of the Act,  21 U.S.C.section 360bbb-3(b)(1), unless the authorization is terminated  or revoked sooner.       Influenza A by PCR NEGATIVE NEGATIVE Final   Influenza B by PCR NEGATIVE NEGATIVE Final    Comment: (NOTE) The Xpert Xpress SARS-CoV-2/FLU/RSV plus assay is intended as an aid in the diagnosis of influenza from Nasopharyngeal swab specimens and should not be used as a sole basis for treatment. Nasal washings and aspirates are unacceptable for Xpert Xpress SARS-CoV-2/FLU/RSV testing.  Fact Sheet for Patients: BloggerCourse.com  Fact Sheet for Healthcare Providers: SeriousBroker.it  This test is not yet approved or cleared by the [place] FDA and has been authorized for detection and/or diagnosis of SARS-CoV-2 by FDA under an Emergency Use Authorization (EUA). This EUA will  remain in effect (meaning this test can be used) for the duration of the COVID-19 declaration under Section 564(b)(1) of the Act, 21 U.S.C. section 360bbb-3(b)(1), unless the authorization is terminated or revoked.     Resp Syncytial Virus by PCR NEGATIVE NEGATIVE Final    Comment: (NOTE) Fact Sheet for Patients: BloggerCourse.com  Fact Sheet for Healthcare Providers: SeriousBroker.it  This test is not yet approved or cleared by the [place] FDA and has been authorized for detection and/or diagnosis of SARS-CoV-2 by FDA under an Emergency Use Authorization (EUA). This EUA will remain in effect (meaning this test can be used) for the duration of the COVID-19 declaration under Section 564(b)(1) of the Act, 21 U.S.C. section 360bbb-3(b)(1), unless the authorization is terminated or revoked.  Performed at [facility], [address] [place], [place] [address]      Radiological Exams on Admission: DG Chest 2 View Result Date: 09/11/2023 CLINICAL DATA:  Chest pain, congestion and dry cough for 2 days  EXAM: CHEST - 2 VIEW COMPARISON:  X-ray 11/12/2014.  CT 05/21/2023. FINDINGS: Hyperinflation. Apical pleural thickening. No consolidation, pneumothorax or effusion. No edema. Normal cardiopericardial silhouette. Overlapping cardiac leads. Degenerative changes of the spine on lateral view. Curvature of the spine as well. Prior CT scan described areas of nodularity and distortion. These are not as well appreciated on this x-ray. IMPRESSION: Hyperinflation. Chronic changes. No acute consolidation or effusion. Electronically Signed   By: Karen Kays M.D.   On: 09/11/2023 18:36    EKG: Independently reviewed. Sinus rhythm.   Assessment/Plan  1. COPD exacerbation  - Culture sputum, continue systemic steroid and antibiotic, schedule DuoNeb, use additional albuterol as needed    2. Polycythemia  - Appears stable, continue Hydrea     DVT prophylaxis: Lovenox  Code Status: Full  Level of Care: Level of care: Med-Surg Family Communication: Sister and neighbor at bedside  Disposition Plan:  Patient is from: Home  Anticipated d/c is to: Home Anticipated d/c date is: 2/1 or 09/13/23  Patient currently: Pending improved respiratory status  Consults called: None Admission status: Observation     Briscoe Deutscher, MD [facility]  09/12/2023, 12:06 AM

## 2023-09-12 NOTE — Progress Notes (Signed)
PROGRESS NOTE    Denise Maynard  KGM:[id number] DOB: 09/14/1955 DOA: 09/11/2023 PCP: Assunta Found, MD    Brief Narrative:   Denise Maynard is a 68 y.o. female with past medical history significant for COPD, polycythemia vera, continued tobacco use disorder who presented to [facility], ED on 1/31 from home via EMS with progressive shortness of breath, cough, chest congestion and chest tightness.  Onset 1 week prior.  Symptoms have been progressively worsening over the last 3 days.  Endorses scant white sputum but mainly nonproductive.  Was seen by outpatient clinic day prior to admission where she was diagnosed with URI and prescribed azithromycin.  Denies headache, no fever, no chills, no chest pain, no palpitations, no abdominal pain, no focal weakness, no fatigue, no paresthesia.  In the ED, temperature 98.4 F, HR 62, RR 21, BP 145/86, SpO2 90% on 2 L nasal cannula.  WBC 11.5, hemoglobin 14.4, platelet count 310.  Sodium 143, potassium 3.7, chloride 106, CO2 23, glucose 103, BUN 17, creatinine 0.77.  High sensitive troponin 6> 8.  Influenza A/B PCR negative, RSV/COVID PCR negative.  Chest x-ray with hyperinflation, chronic changes, no acute consolidation or effusion.  In the ED, patient was started on systemic steroids, breathing treatments and supplemental oxygen.  TRH consulted for admission for further evaluation management of acute COPD exacerbation.  Assessment & Plan:   COPD exacerbation Patient presenting to ED with 1 week history of progressive shortness of breath associated with mildly productive cough, chest congestion/tightness.  Was seen outpatient and prescribed azithromycin for concern of URI.  On arrival to the ED patient tachypneic requiring 2 L nasal cannula which she is not on baseline.  Continues to endorse continued tobacco use and denies any home inhaler use.  Patient is afebrile with slightly elevated WBC count.  Chest x-ray with no focal consolidation. -- Solu-Medrol 125  mg IV q12h x 1 day followed by prednisone 40 mg p.o. daily -- Brovana neb twice daily -- Pulmicort neb twice daily -- DuoNeb scheduled 3 times daily -- Albuterol neb every 2 hours as needed shortness of breath/wheezing -- Guaifenesin 5 mL every 4 hours as needed cough -- Continue supplemental oxygen, maintain SpO2 greater than 88%; currently on 2 L nasal cannula -- Would likely benefit from pulmonology outpatient referral as well as controlling inhalers on discharge -- Ambulatory O2 screen  Polycythemia vera Follows with medical oncology sees hematology, Dr. Ellin Saba.  Resume home Hydrea on discharge.  HLD -- Crestor 5 mg p.o. daily  Anxiety -- Alprazolam 0.25 mg p.o. nightly as needed sleep/anxiety  Tobacco use disorder Patient continues to endorse 1 pack/day, counseled on need for complete cessation/abstinence. --Nicotine patch  Weakness/debility/deconditioning: -- PT evaluation   DVT prophylaxis: enoxaparin (LOVENOX) injection 40 mg Start: 09/12/23 1000    Code Status: Full Code Family Communication: No family present at bedside this morning  Disposition Plan:  Level of care: Med-Surg Status is: Observation The patient remains OBS appropriate and will d/c before 2 midnights.    Consultants:  None  Procedures:  None  Antimicrobials:  Azithromycin 2/1>>   Subjective: Patient seen and examined at bedside, resting calmly.   sitting in bedside chair.  Continues with dyspnea, remains on 2 L nasal cannula.  Also continues with nonproductive cough this morning.  Dyspnea worse with ambulation.  Discussed with patient needs complete cessation from tobacco use is likely contributing factor with her underlying COPD.  Patient also endorses that she does not utilize any inhalers at  home and would likely benefit from some on discharge.  Also would benefit from outpatient pulmonology referral for further management following the hospitalization.  Patient with no other specific  complaints, concerns or questions at this time.  Denies headache, no visual changes, no chest pain, no palpitations, no fever/chills/night sweats, no nausea/vomiting/diarrhea, no focal weakness, no fatigue, no paresthesia.  No acute events overnight per nursing.  Objective: Vitals:   09/12/23 0021 09/12/23 0030 09/12/23 0434 09/12/23 0901  BP:  130/60 124/75   Pulse:  68 62   Resp:  16 16   Temp: 98.3 F (36.8 C) 98.2 F (36.8 C) 98.3 F (36.8 C)   TempSrc:  Oral Oral   SpO2:  97% 98% 93%  Weight:  61.2 kg    Height:  5\' 5"  (1.651 m)      Intake/Output Summary (Last 24 hours) at 09/12/2023 0933 Last data filed at 09/12/2023 0434 Gross per 24 hour  Intake 240 ml  Output --  Net 240 ml   Filed Weights   09/11/23 1751 09/12/23 0030  Weight: 62.6 kg 61.2 kg    Examination:  Physical Exam: GEN: NAD, alert and oriented x 3, elderly appearance HEENT: NCAT, PERRL, EOMI, sclera clear, MMM PULM: Diminished breath sounds bilateral bases, diminished air movement throughout all lung fields with wheezing, no crackles, normal respiratory effort without accessory muscle use, on 2 L nasal cannula CV: RRR w/o M/G/R GI: abd soft, NTND, NABS, no R/G/M MSK: no peripheral edema, muscle strength globally intact 5/5 bilateral upper/lower extremities NEURO: CN II-XII intact, no focal deficits, sensation to light touch intact PSYCH: normal mood/affect Integumentary: No concerning rashes/lesions/wounds noted on exposed skin surfaces    Data Reviewed: I have personally reviewed following labs and imaging studies  CBC: Recent Labs  Lab 09/11/23 1800 09/12/23 0422  WBC 11.5* 8.7  HGB 14.4 12.2  HCT 43.5 35.7*  MCV 103.3* 103.2*  PLT 310 272   Basic Metabolic Panel: Recent Labs  Lab 09/11/23 1800 09/12/23 0422  NA 143 139  K 3.7 3.5  CL 106 107  CO2 23 23  GLUCOSE 103* 121*  BUN 17 18  CREATININE 0.77 0.59  CALCIUM 9.9 8.6*   GFR: Estimated Creatinine Clearance: 59.7 mL/min (by  C-G formula based on SCr of 0.59 mg/dL). Liver Function Tests: No results for input(s): "AST", "ALT", "ALKPHOS", "BILITOT", "PROT", "ALBUMIN" in the last 168 hours. No results for input(s): "LIPASE", "AMYLASE" in the last 168 hours. No results for input(s): "AMMONIA" in the last 168 hours. Coagulation Profile: No results for input(s): "INR", "PROTIME" in the last 168 hours. Cardiac Enzymes: No results for input(s): "CKTOTAL", "CKMB", "CKMBINDEX", "TROPONINI" in the last 168 hours. BNP (last 3 results) No results for input(s): "PROBNP" in the last 8760 hours. HbA1C: No results for input(s): "HGBA1C" in the last 72 hours. CBG: No results for input(s): "GLUCAP" in the last 168 hours. Lipid Profile: No results for input(s): "CHOL", "HDL", "LDLCALC", "TRIG", "CHOLHDL", "LDLDIRECT" in the last 72 hours. Thyroid Function Tests: No results for input(s): "TSH", "T4TOTAL", "FREET4", "T3FREE", "THYROIDAB" in the last 72 hours. Anemia Panel: No results for input(s): "VITAMINB12", "FOLATE", "FERRITIN", "TIBC", "IRON", "RETICCTPCT" in the last 72 hours. Sepsis Labs: No results for input(s): "PROCALCITON", "LATICACIDVEN" in the last 168 hours.  Recent Results (from the past 240 hours)  Resp panel by RT-PCR (RSV, Flu A&B, Covid) Anterior Nasal Swab     Status: None   Collection Time: 09/11/23  8:54 PM   Specimen: Anterior  Nasal Swab  Result Value Ref Range Status   SARS Coronavirus 2 by RT PCR NEGATIVE NEGATIVE Final    Comment: (NOTE) SARS-CoV-2 target nucleic acids are NOT DETECTED.  The SARS-CoV-2 RNA is generally detectable in upper respiratory specimens during the acute phase of infection. The lowest concentration of SARS-CoV-2 viral copies this assay can detect is 138 copies/mL. A negative result does not preclude SARS-Cov-2 infection and should not be used as the sole basis for treatment or other patient management decisions. A negative result may occur with  improper specimen  collection/handling, submission of specimen other than nasopharyngeal swab, presence of viral mutation(s) within the areas targeted by this assay, and inadequate number of viral copies(<138 copies/mL). A negative result must be combined with clinical observations, patient history, and epidemiological information. The expected result is Negative.  Fact Sheet for Patients:  BloggerCourse.com  Fact Sheet for Healthcare Providers:  SeriousBroker.it  This test is no t yet approved or cleared by the [place] FDA and  has been authorized for detection and/or diagnosis of SARS-CoV-2 by FDA under an Emergency Use Authorization (EUA). This EUA will remain  in effect (meaning this test can be used) for the duration of the COVID-19 declaration under Section 564(b)(1) of the Act, 21 U.S.C.section 360bbb-3(b)(1), unless the authorization is terminated  or revoked sooner.       Influenza A by PCR NEGATIVE NEGATIVE Final   Influenza B by PCR NEGATIVE NEGATIVE Final    Comment: (NOTE) The Xpert Xpress SARS-CoV-2/FLU/RSV plus assay is intended as an aid in the diagnosis of influenza from Nasopharyngeal swab specimens and should not be used as a sole basis for treatment. Nasal washings and aspirates are unacceptable for Xpert Xpress SARS-CoV-2/FLU/RSV testing.  Fact Sheet for Patients: BloggerCourse.com  Fact Sheet for Healthcare Providers: SeriousBroker.it  This test is not yet approved or cleared by the [place] FDA and has been authorized for detection and/or diagnosis of SARS-CoV-2 by FDA under an Emergency Use Authorization (EUA). This EUA will remain in effect (meaning this test can be used) for the duration of the COVID-19 declaration under Section 564(b)(1) of the Act, 21 U.S.C. section 360bbb-3(b)(1), unless the authorization is terminated or revoked.     Resp Syncytial  Virus by PCR NEGATIVE NEGATIVE Final    Comment: (NOTE) Fact Sheet for Patients: BloggerCourse.com  Fact Sheet for Healthcare Providers: SeriousBroker.it  This test is not yet approved or cleared by the [place] FDA and has been authorized for detection and/or diagnosis of SARS-CoV-2 by FDA under an Emergency Use Authorization (EUA). This EUA will remain in effect (meaning this test can be used) for the duration of the COVID-19 declaration under Section 564(b)(1) of the Act, 21 U.S.C. section 360bbb-3(b)(1), unless the authorization is terminated or revoked.  Performed at [facility], [address], [place], [place] [address]          Radiology Studies: DG Chest 2 View Result Date: 09/11/2023 CLINICAL DATA:  Chest pain, congestion and dry cough for 2 days EXAM: CHEST - 2 VIEW COMPARISON:  X-ray 11/12/2014.  CT 05/21/2023. FINDINGS: Hyperinflation. Apical pleural thickening. No consolidation, pneumothorax or effusion. No edema. Normal cardiopericardial silhouette. Overlapping cardiac leads. Degenerative changes of the spine on lateral view. Curvature of the spine as well. Prior CT scan described areas of nodularity and distortion. These are not as well appreciated on this x-ray. IMPRESSION: Hyperinflation. Chronic changes. No acute consolidation or effusion. Electronically Signed   By: Piedad Climes.D.  On: 09/11/2023 18:36        Scheduled Meds:  arformoterol  15 mcg Nebulization BID   aspirin EC  81 mg Oral Daily   azithromycin  250 mg Oral Daily   budesonide (PULMICORT) nebulizer solution  0.5 mg Nebulization BID   enoxaparin (LOVENOX) injection  40 mg Subcutaneous Q24H   ipratropium-albuterol  3 mL Nebulization TID   methylPREDNISolone (SOLU-MEDROL) injection  125 mg Intravenous Q12H   Followed by   Melene Muller ON 09/13/2023] predniSONE  40 mg Oral Q breakfast   rosuvastatin  5 mg Oral Daily   Continuous Infusions:    LOS: 0 days    Time spent: 54 minutes spent on chart review, discussion with nursing staff, consultants, updating family and interview/physical exam; more than 50% of that time was spent in counseling and/or coordination of care.    Alvira Philips [place], DO [facility] Available via Epic secure chat 7am-7pm After these hours, please refer to coverage provider listed on [email or web address] 09/12/2023, 9:33 AM

## 2023-09-12 NOTE — Progress Notes (Signed)
Patient ambulated on RA with an o2 saturation of 95%. Patient did still complain of chest tightness and shakiness. MD [place] notified.

## 2023-09-12 NOTE — Progress Notes (Signed)
Patient is still shaking. Patient's family/friend is concerned. MD [place] states it is probably from the steroids. Patient's family/friend states that she was shaking before the medication was started. MD [place] aware.

## 2023-09-12 NOTE — Evaluation (Signed)
Physical Therapy Brief Evaluation and Discharge Note Patient Details Name: Denise Maynard MRN: [id number] DOB: 02-28-1956 Today's Date: 09/12/2023   History of Present Illness  a 68 y.o. female with past medical history significant for COPD, polycythemia vera, continued tobacco use disorder who presented to [facility] ED on 1/31 from home via EMS with progressive shortness of breath, cough, chest congestion and chest tightness.  Onset 1 week prior.  Symptoms have been progressively worsening over the last 3 days.  Endorses scant white sputum but mainly nonproductive.  Was seen by outpatient clinic day prior to admission where she was diagnosed with URI and prescribed azithromycin.  Denies headache, no fever, no chills, no chest pain, no palpitations, no abdominal pain, no focal weakness, no fatigue, no paresthesia.  Clinical Impression   Pt tolerating evaluation well. During ambulation of 152ft, pt on room air and saturating @ > 95% entire length of ambulation bout. Pt continues to note concerns regarding tightness of chest and shakiness from steroids. Pt was educated making concerns known to staff. Functionally, patient is performing at functional baseline, no safety concerns noted throughout evaluation.  Patient is safe to DC home with appropriate services, DME, and any other needs indicated.  PT to sign off at this time.  Please reconsult acute PT services if functional changes occur while admitted.  Thank you.       PT Assessment    Assistance Needed at Discharge       Equipment Recommendations None recommended by PT  Recommendations for Other Services       Precautions/Restrictions Precautions Precautions: None Restrictions Weight Bearing Restrictions Per Provider Order: No        Mobility  Bed Mobility          Transfers Overall transfer level: Independent                      Ambulation/Gait Ambulation/Gait assistance: Independent Gait Distance (Feet): 120  Feet Assistive device: None Gait Pattern/deviations: WFL(Within Functional Limits)   General Gait Details: safe ambulation, with no LOB. Turning appropriately. 127ft on room air, SpO2 > 95% entire time.  Home Activity Instructions    Stairs            Modified Rankin (Stroke Patients Only)        Balance                          Pertinent Vitals/Pain   Pain Assessment Pain Assessment:  (Pt c/o chest tightness, but does not characterize as pain.)     Home Living   Living Arrangements: Alone       Home Equipment: None        Prior Function        UE/LE Assessment               Communication   Communication Communication: No apparent difficulties     Cognition         General Comments      Exercises     Assessment/Plan    PT Problem List         PT Visit Diagnosis Other abnormalities of gait and mobility (R26.89)    No Skilled PT Patient is independent with all acitivity/mobility   Co-evaluation                AMPAC 6 Clicks Help needed turning from your back to your side while in a flat bed  without using bedrails?: None Help needed moving from lying on your back to sitting on the side of a flat bed without using bedrails?: None Help needed moving to and from a bed to a chair (including a wheelchair)?: None Help needed standing up from a chair using your arms (e.g., wheelchair or bedside chair)?: None Help needed to walk in hospital room?: None Help needed climbing 3-5 steps with a railing? : None 6 Click Score: 24      End of Session Equipment Utilized During Treatment: Gait belt Activity Tolerance: Patient tolerated treatment well Patient left: in bed;with call bell/phone within reach;with family/visitor present Nurse Communication: Mobility status PT Visit Diagnosis: Other abnormalities of gait and mobility (R26.89)     Time: 1027-2536 PT Time Calculation (min) (ACUTE ONLY): 18 min  Charges:   PT  Evaluation $PT Eval Low Complexity: 1 Low      Elie Goody, DPT [facility] [place] [address] [phone] office  Nelida Meuse  09/12/2023, 10:26 AM

## 2023-09-12 NOTE — Progress Notes (Signed)
   09/12/23 1357  TOC Brief Assessment  Insurance and Status Reviewed  Patient has primary care physician Yes  Home environment has been reviewed Single family home  Prior level of function: Independent  Prior/Current Home Services No current home services  Social Drivers of Health Review SDOH reviewed no interventions necessary  Readmission risk has been reviewed Yes  Transition of care needs no transition of care needs at this time

## 2023-09-13 DIAGNOSIS — J441 Chronic obstructive pulmonary disease with (acute) exacerbation: Secondary | ICD-10-CM | POA: Diagnosis not present

## 2023-09-13 LAB — BASIC METABOLIC PANEL
Anion gap: 12 (ref 5–15)
BUN: 13 mg/dL (ref 8–23)
CO2: 23 mmol/L (ref 22–32)
Calcium: 9.3 mg/dL (ref 8.9–10.3)
Chloride: 106 mmol/L (ref 98–111)
Creatinine, Ser: 0.65 mg/dL (ref 0.44–1.00)
GFR, Estimated: >60 mL/min (ref >60)
Glucose, Bld: 121 mg/dL — ABNORMAL HIGH (ref 70–99)
Potassium: 5.1 mmol/L (ref 3.5–5.1)
Sodium: 141 mmol/L (ref 135–145)

## 2023-09-13 LAB — MAGNESIUM: Magnesium: 2.3 mg/dL (ref 1.7–2.4)

## 2023-09-13 MED ORDER — PREDNISONE 10 MG PO TABS: ORAL_TABLET | ORAL | 0 refills | Status: AC

## 2023-09-13 MED ORDER — ALBUTEROL SULFATE HFA 108 (90 BASE) MCG/ACT IN AERS
2.0000 | INHALATION_SPRAY | Freq: Four times a day (QID) | RESPIRATORY_TRACT | 2 refills | Status: AC | PRN
Start: 2023-09-13 — End: ?

## 2023-09-13 NOTE — Discharge Summary (Signed)
Physician Discharge Summary  Denise Maynard ZOX:[id number] DOB: 12-13-1955 DOA: 09/11/2023  PCP: Assunta Found, MD  Admit date: 09/11/2023 Discharge date: 09/13/2023  Admitted From: Home Disposition: Home  Recommendations for Outpatient Follow-up:  Follow up with PCP in 1-2 weeks Continue prednisone taper on discharge for COPD exacerbation Resume azithromycin prescribed by PCP to complete antibiotic course, 3 doses remaining Continue to encourage tobacco cessation Would benefit from outpatient PFTs, pulmonology referral; and started on COPD inhaled medications but patient would like to discuss with PCP prior to initiation.   Home Health: No needs identified by PT Equipment/Devices: None  Discharge Condition: Stable CODE STATUS: Full code Diet recommendation: Regular diet  History of present illness:  Denise Maynard is a 68 y.o. female with past medical history significant for COPD, polycythemia vera, continued tobacco use disorder who presented to [facility], ED on 1/31 from home via EMS with progressive shortness of breath, cough, chest congestion and chest tightness.  Onset 1 week prior.  Symptoms have been progressively worsening over the last 3 days.  Endorses scant white sputum but mainly nonproductive.  Was seen by outpatient clinic day prior to admission where she was diagnosed with URI and prescribed azithromycin.  Denies headache, no fever, no chills, no chest pain, no palpitations, no abdominal pain, no focal weakness, no fatigue, no paresthesia.   In the ED, temperature 98.4 F, HR 62, RR 21, BP 145/86, SpO2 90% on 2 L nasal cannula.  WBC 11.5, hemoglobin 14.4, platelet count 310.  Sodium 143, potassium 3.7, chloride 106, CO2 23, glucose 103, BUN 17, creatinine 0.77.  High sensitive troponin 6> 8.  Influenza A/B PCR negative, RSV/COVID PCR negative.  Chest x-ray with hyperinflation, chronic changes, no acute consolidation or effusion.  In the ED, patient was started on systemic  steroids, breathing treatments and supplemental oxygen.  TRH consulted for admission for further evaluation management of acute COPD exacerbation.  Hospital course:  COPD exacerbation Patient presenting to ED with 1 week history of progressive shortness of breath associated with mildly productive cough, chest congestion/tightness.  Was seen outpatient and prescribed azithromycin for concern of URI.  On arrival to the ED patient tachypneic requiring 2 L nasal cannula which she is not on baseline.  Continues to endorse continued tobacco use and denies any home inhaler use.  Patient is afebrile with slightly elevated WBC count.  Chest x-ray with no focal consolidation.  Patient was started on IV steroids, scheduled neb treatments.  Patient was able to be titrated off Esimil oxygen.  Symptoms markedly improved and will discharge to complete antibiotic course with azithromycin previously prescribed by PCP as well as steroid taper.  Discussed with patient extensively needs tobacco cessation.  Also discussed referral to pulmonology, outpatient PFTs and need to likely initiate chronic inhaled therapy for underlying COPD but wishes to discuss with her PCP outpatient.   Polycythemia vera Follows with medical oncology sees hematology, Dr. Ellin Saba.  Resume home Hydrea on discharge.   HLD Crestor 5 mg p.o. daily   Anxiety Alprazolam 0.25 mg p.o. nightly as needed sleep/anxiety   Tobacco use disorder Patient continues to endorse 1 pack/day, counseled on need for complete cessation/abstinence.   Weakness/debility/deconditioning: Seen by physical therapy, no needs identified.  Discharge Diagnoses:  Principal Problem:   COPD exacerbation (HCC) Active Problems:   Polycythemia vera [facility])    Discharge Instructions  Discharge Instructions     Call MD for:  difficulty breathing, headache or visual disturbances   Complete by: As  directed    Call MD for:  extreme fatigue   Complete by: As directed     Call MD for:  persistant dizziness or light-headedness   Complete by: As directed    Call MD for:  persistant nausea and vomiting   Complete by: As directed    Call MD for:  severe uncontrolled pain   Complete by: As directed    Call MD for:  temperature >100.4   Complete by: As directed    Diet - low sodium heart healthy   Complete by: As directed    Increase activity slowly   Complete by: As directed       Allergies as of 09/13/2023   No Known Allergies      Medication List     TAKE these medications    acetaminophen 500 MG tablet Commonly known as: TYLENOL Take 1,000 mg by mouth every 6 (six) hours as needed for mild pain or moderate pain.   albuterol 108 (90 Base) MCG/ACT inhaler Commonly known as: VENTOLIN HFA Inhale 2 puffs into the lungs every 6 (six) hours as needed for wheezing or shortness of breath.   alendronate 70 MG tablet Commonly known as: FOSAMAX Take 70 mg by mouth once a week.   ALPRAZolam 0.5 MG tablet Commonly known as: XANAX Take 1 tablet (0.5 mg total) by mouth at bedtime. What changed:  how much to take when to take this reasons to take this   aspirin EC 81 MG tablet Take 81 mg by mouth daily.   azithromycin 250 MG tablet Commonly known as: ZITHROMAX Take 250 mg by mouth daily. Z-pack   benzonatate 200 MG capsule Commonly known as: TESSALON Take 200 mg by mouth 3 (three) times daily as needed for cough.   bismuth subsalicylate 262 MG/15ML suspension Commonly known as: PEPTO BISMOL Take 30 mLs by mouth every 6 (six) hours as needed for indigestion or diarrhea or loose stools.   Calcium+D3 500-15 MG-MCG Tabs Generic drug: Calcium Carb-Cholecalciferol Take 1 tablet by mouth daily.   DRY EYES OP Apply 1 drop to eye daily.   Fish Oil 500 MG Caps Take 500 mg by mouth once.   hydroxyurea 500 MG capsule Commonly known as: HYDREA TAKE 1 CAPSULE BY MOUTH DAILY  MAY TAKE WITH FOOD TO MINIMIZE  GI SIDE EFFECTS What changed: See the  new instructions.   multivitamin with minerals tablet Take 1 tablet by mouth daily.   predniSONE 10 MG tablet Commonly known as: DELTASONE Take 4 tablets (40 mg total) by mouth daily for 3 days, THEN 3 tablets (30 mg total) daily for 3 days, THEN 2 tablets (20 mg total) daily for 3 days, THEN 1 tablet (10 mg total) daily for 3 days. Start taking on: September 14, 2023   PSEUDOEPH-BROMPHEN-HYDROCODONE PO Take 7.5 mLs by mouth 2 (two) times daily as needed (cough).   rosuvastatin 5 MG tablet Commonly known as: CRESTOR Take 5 mg by mouth daily.   Vitamin D 50 MCG (2000 UT) tablet Take 2,000 Units by mouth daily.        Follow-up Information     Assunta Found, MD. Schedule an appointment as soon as possible for a visit in 1 week(s).   Specialty: Family Medicine Contact information: [address] [place] [address] [phone]                No Known Allergies  Consultations: None   Procedures/Studies: DG Chest 2 View Result Date: 09/11/2023 CLINICAL DATA:  Chest pain, congestion and dry cough for 2 days EXAM: CHEST - 2 VIEW COMPARISON:  X-ray 11/12/2014.  CT 05/21/2023. FINDINGS: Hyperinflation. Apical pleural thickening. No consolidation, pneumothorax or effusion. No edema. Normal cardiopericardial silhouette. Overlapping cardiac leads. Degenerative changes of the spine on lateral view. Curvature of the spine as well. Prior CT scan described areas of nodularity and distortion. These are not as well appreciated on this x-ray. IMPRESSION: Hyperinflation. Chronic changes. No acute consolidation or effusion. Electronically Signed   By: Karen Kays M.D.   On: 09/11/2023 18:36     Subjective: Patient seen examined bedside, resting,.  Walking around room.  Remains off of oxygen.  Dyspnea much improved.  Ready for discharge home.  Discussed once again need for complete tobacco abstinence given her underlying COPD.  Also discussed starting on chronic controlled  inhaler therapy but wishes to follow-up with PCP for further guidance.  No other specific questions, concerns or complaints at this time.  Denies headache, no dizziness, no chest pain, no palpitations, no shortness of breath, no abdominal pain, no fever/chills/night sweats, no nausea/vomiting/diarrhea, no focal weakness, no fatigue, no paresthesias.  No acute events overnight per nursing staff.  Discharge Exam: Vitals:   09/12/23 2040 09/13/23 0451  BP:  135/71  Pulse:  65  Resp:  16  Temp:  98.9 F (37.2 C)  SpO2: 94% 96%   Vitals:   09/12/23 1430 09/12/23 1947 09/12/23 2040 09/13/23 0451  BP: 123/69 136/78  135/71  Pulse: 66 60  65  Resp:  18  16  Temp: 98 F (36.7 C) 98.3 F (36.8 C)  98.9 F (37.2 C)  TempSrc: Oral Oral  Oral  SpO2: 95% 96% 94% 96%  Weight:      Height:        Physical Exam: GEN: NAD, alert and oriented x 3, elderly in appearance HEENT: NCAT, PERRL, EOMI, sclera clear, MMM PULM: Breath sounds slight diminished bilateral bases, mild late expiratory wheezing bilateral upper lung fields, no crackles, normal respiratory effort without accessory muscle use, on room air with SpO2 96%. CV: RRR w/o M/G/R GI: abd soft, NTND, NABS, no R/G/M MSK: no peripheral edema, muscle strength globally intact 5/5 bilateral upper/lower extremities NEURO: CN II-XII intact, no focal deficits, sensation to light touch intact PSYCH: normal mood/affect Integumentary: dry/intact, no rashes or wounds    The results of significant diagnostics from this hospitalization (including imaging, microbiology, ancillary and laboratory) are listed below for reference.     Microbiology: Recent Results (from the past 240 hours)  Resp panel by RT-PCR (RSV, Flu A&B, Covid) Anterior Nasal Swab     Status: None   Collection Time: 09/11/23  8:54 PM   Specimen: Anterior Nasal Swab  Result Value Ref Range Status   SARS Coronavirus 2 by RT PCR NEGATIVE NEGATIVE Final    Comment:  (NOTE) SARS-CoV-2 target nucleic acids are NOT DETECTED.  The SARS-CoV-2 RNA is generally detectable in upper respiratory specimens during the acute phase of infection. The lowest concentration of SARS-CoV-2 viral copies this assay can detect is 138 copies/mL. A negative result does not preclude SARS-Cov-2 infection and should not be used as the sole basis for treatment or other patient management decisions. A negative result may occur with  improper specimen collection/handling, submission of specimen other than nasopharyngeal swab, presence of viral mutation(s) within the areas targeted by this assay, and inadequate number of viral copies(<138 copies/mL). A negative result must be combined with clinical observations, patient history, and epidemiological  information. The expected result is Negative.  Fact Sheet for Patients:  BloggerCourse.com  Fact Sheet for Healthcare Providers:  SeriousBroker.it  This test is no t yet approved or cleared by the [place] FDA and  has been authorized for detection and/or diagnosis of SARS-CoV-2 by FDA under an Emergency Use Authorization (EUA). This EUA will remain  in effect (meaning this test can be used) for the duration of the COVID-19 declaration under Section 564(b)(1) of the Act, 21 U.S.C.section 360bbb-3(b)(1), unless the authorization is terminated  or revoked sooner.       Influenza A by PCR NEGATIVE NEGATIVE Final   Influenza B by PCR NEGATIVE NEGATIVE Final    Comment: (NOTE) The Xpert Xpress SARS-CoV-2/FLU/RSV plus assay is intended as an aid in the diagnosis of influenza from Nasopharyngeal swab specimens and should not be used as a sole basis for treatment. Nasal washings and aspirates are unacceptable for Xpert Xpress SARS-CoV-2/FLU/RSV testing.  Fact Sheet for Patients: BloggerCourse.com  Fact Sheet for Healthcare  Providers: SeriousBroker.it  This test is not yet approved or cleared by the [place] FDA and has been authorized for detection and/or diagnosis of SARS-CoV-2 by FDA under an Emergency Use Authorization (EUA). This EUA will remain in effect (meaning this test can be used) for the duration of the COVID-19 declaration under Section 564(b)(1) of the Act, 21 U.S.C. section 360bbb-3(b)(1), unless the authorization is terminated or revoked.     Resp Syncytial Virus by PCR NEGATIVE NEGATIVE Final    Comment: (NOTE) Fact Sheet for Patients: BloggerCourse.com  Fact Sheet for Healthcare Providers: SeriousBroker.it  This test is not yet approved or cleared by the [place] FDA and has been authorized for detection and/or diagnosis of SARS-CoV-2 by FDA under an Emergency Use Authorization (EUA). This EUA will remain in effect (meaning this test can be used) for the duration of the COVID-19 declaration under Section 564(b)(1) of the Act, 21 U.S.C. section 360bbb-3(b)(1), unless the authorization is terminated or revoked.  Performed at [facility], [address] [place], [place] [address]      Labs: BNP (last 3 results) No results for input(s): "BNP" in the last 8760 hours. Basic Metabolic Panel: Recent Labs  Lab 09/11/23 1800 09/12/23 0422 09/13/23 0657  NA 143 139 141  K 3.7 3.5 5.1  CL 106 107 106  CO2 23 23 23   GLUCOSE 103* 121* 121*  BUN 17 18 13   CREATININE 0.77 0.59 0.65  CALCIUM 9.9 8.6* 9.3  MG  --   --  2.3   Liver Function Tests: No results for input(s): "AST", "ALT", "ALKPHOS", "BILITOT", "PROT", "ALBUMIN" in the last 168 hours. No results for input(s): "LIPASE", "AMYLASE" in the last 168 hours. No results for input(s): "AMMONIA" in the last 168 hours. CBC: Recent Labs  Lab 09/11/23 1800 09/12/23 0422  WBC 11.5* 8.7  HGB 14.4 12.2  HCT 43.5 35.7*  MCV 103.3* 103.2*  PLT  310 272   Cardiac Enzymes: No results for input(s): "CKTOTAL", "CKMB", "CKMBINDEX", "TROPONINI" in the last 168 hours. BNP: Invalid input(s): "POCBNP" CBG: No results for input(s): "GLUCAP" in the last 168 hours. D-Dimer No results for input(s): "DDIMER" in the last 72 hours. Hgb A1c No results for input(s): "HGBA1C" in the last 72 hours. Lipid Profile No results for input(s): "CHOL", "HDL", "LDLCALC", "TRIG", "CHOLHDL", "LDLDIRECT" in the last 72 hours. Thyroid function studies No results for input(s): "TSH", "T4TOTAL", "T3FREE", "THYROIDAB" in the last 72 hours.  Invalid input(s): "FREET3" Anemia work  up No results for input(s): "VITAMINB12", "FOLATE", "FERRITIN", "TIBC", "IRON", "RETICCTPCT" in the last 72 hours. Urinalysis    Component Value Date/Time   COLORURINE STRAW (A) 12/04/2017 0927   APPEARANCEUR CLEAR 12/04/2017 0927   LABSPEC 1.002 (L) 12/04/2017 0927   PHURINE 6.0 12/04/2017 0927   GLUCOSEU NEGATIVE 12/04/2017 0927   HGBUR NEGATIVE 12/04/2017 0927   BILIRUBINUR NEGATIVE 12/04/2017 0927   KETONESUR NEGATIVE 12/04/2017 0927   PROTEINUR NEGATIVE 12/04/2017 0927   NITRITE NEGATIVE 12/04/2017 0927   LEUKOCYTESUR NEGATIVE 12/04/2017 0927   Sepsis Labs Recent Labs  Lab 09/11/23 1800 09/12/23 0422  WBC 11.5* 8.7   Microbiology Recent Results (from the past 240 hours)  Resp panel by RT-PCR (RSV, Flu A&B, Covid) Anterior Nasal Swab     Status: None   Collection Time: 09/11/23  8:54 PM   Specimen: Anterior Nasal Swab  Result Value Ref Range Status   SARS Coronavirus 2 by RT PCR NEGATIVE NEGATIVE Final    Comment: (NOTE) SARS-CoV-2 target nucleic acids are NOT DETECTED.  The SARS-CoV-2 RNA is generally detectable in upper respiratory specimens during the acute phase of infection. The lowest concentration of SARS-CoV-2 viral copies this assay can detect is 138 copies/mL. A negative result does not preclude SARS-Cov-2 infection and should not be used as the  sole basis for treatment or other patient management decisions. A negative result may occur with  improper specimen collection/handling, submission of specimen other than nasopharyngeal swab, presence of viral mutation(s) within the areas targeted by this assay, and inadequate number of viral copies(<138 copies/mL). A negative result must be combined with clinical observations, patient history, and epidemiological information. The expected result is Negative.  Fact Sheet for Patients:  BloggerCourse.com  Fact Sheet for Healthcare Providers:  SeriousBroker.it  This test is no t yet approved or cleared by the [place] FDA and  has been authorized for detection and/or diagnosis of SARS-CoV-2 by FDA under an Emergency Use Authorization (EUA). This EUA will remain  in effect (meaning this test can be used) for the duration of the COVID-19 declaration under Section 564(b)(1) of the Act, 21 U.S.C.section 360bbb-3(b)(1), unless the authorization is terminated  or revoked sooner.       Influenza A by PCR NEGATIVE NEGATIVE Final   Influenza B by PCR NEGATIVE NEGATIVE Final    Comment: (NOTE) The Xpert Xpress SARS-CoV-2/FLU/RSV plus assay is intended as an aid in the diagnosis of influenza from Nasopharyngeal swab specimens and should not be used as a sole basis for treatment. Nasal washings and aspirates are unacceptable for Xpert Xpress SARS-CoV-2/FLU/RSV testing.  Fact Sheet for Patients: BloggerCourse.com  Fact Sheet for Healthcare Providers: SeriousBroker.it  This test is not yet approved or cleared by the [place] FDA and has been authorized for detection and/or diagnosis of SARS-CoV-2 by FDA under an Emergency Use Authorization (EUA). This EUA will remain in effect (meaning this test can be used) for the duration of the COVID-19 declaration under Section 564(b)(1) of the  Act, 21 U.S.C. section 360bbb-3(b)(1), unless the authorization is terminated or revoked.     Resp Syncytial Virus by PCR NEGATIVE NEGATIVE Final    Comment: (NOTE) Fact Sheet for Patients: BloggerCourse.com  Fact Sheet for Healthcare Providers: SeriousBroker.it  This test is not yet approved or cleared by the [place] FDA and has been authorized for detection and/or diagnosis of SARS-CoV-2 by FDA under an Emergency Use Authorization (EUA). This EUA will remain in effect (meaning this test can be used) for  the duration of the COVID-19 declaration under Section 564(b)(1) of the Act, 21 U.S.C. section 360bbb-3(b)(1), unless the authorization is terminated or revoked.  Performed at [facility], [address] [place], [place] [address]      Time coordinating discharge: Over 30 minutes  SIGNED:   Alvira Philips [place], DO  [facility] 09/13/2023, 8:44 AM

## 2023-09-13 NOTE — Progress Notes (Signed)
IV removed. Discharge instructions explained. Patient understands. Waiting for family to pick up patient.

## 2023-09-13 NOTE — Care Management Obs Status (Signed)
MEDICARE OBSERVATION STATUS NOTIFICATION   Patient Details  Name: Denise Maynard MRN: [id number] Date of Birth: 12/25/1955   Medicare Observation Status Notification Given:  Yes  I, Karn Cassis, LCSW, verbally reviewed observation notice.  09/13/2023, 8:54 AM   Karn Cassis, LCSW 09/13/2023, 8:54 AM

## 2023-09-21 DIAGNOSIS — J439 Emphysema, unspecified: Secondary | ICD-10-CM | POA: Diagnosis not present

## 2023-09-21 DIAGNOSIS — Z6823 Body mass index (BMI) 23.0-23.9, adult: Secondary | ICD-10-CM | POA: Diagnosis not present

## 2023-09-21 DIAGNOSIS — F17200 Nicotine dependence, unspecified, uncomplicated: Secondary | ICD-10-CM | POA: Diagnosis not present

## 2023-09-30 ENCOUNTER — Inpatient Hospital Stay: Payer: Medicare Other

## 2023-10-07 ENCOUNTER — Inpatient Hospital Stay: Payer: Medicare Other | Attending: Hematology

## 2023-10-07 ENCOUNTER — Inpatient Hospital Stay: Payer: Medicare Other | Admitting: Hematology

## 2023-10-07 DIAGNOSIS — D45 Polycythemia vera: Secondary | ICD-10-CM | POA: Diagnosis not present

## 2023-10-07 DIAGNOSIS — D471 Chronic myeloproliferative disease: Secondary | ICD-10-CM

## 2023-10-07 DIAGNOSIS — D75839 Thrombocytosis, unspecified: Secondary | ICD-10-CM

## 2023-10-07 LAB — CBC WITH DIFFERENTIAL/PLATELET
Abs Immature Granulocytes: 0.04 10*3/uL (ref 0.00–0.07)
Basophils Absolute: 0 10*3/uL (ref 0.0–0.1)
Basophils Relative: 0 %
Eosinophils Absolute: 0.1 10*3/uL (ref 0.0–0.5)
Eosinophils Relative: 1 %
HCT: 40.6 % (ref 36.0–46.0)
Hemoglobin: 13.3 g/dL (ref 12.0–15.0)
Immature Granulocytes: 1 %
Lymphocytes Relative: 22 %
Lymphs Abs: 1.9 10*3/uL (ref 0.7–4.0)
MCH: 34.8 pg — ABNORMAL HIGH (ref 26.0–34.0)
MCHC: 32.8 g/dL (ref 30.0–36.0)
MCV: 106.3 fL — ABNORMAL HIGH (ref 80.0–100.0)
Monocytes Absolute: 0.8 10*3/uL (ref 0.1–1.0)
Monocytes Relative: 9 %
Neutro Abs: 5.6 10*3/uL (ref 1.7–7.7)
Neutrophils Relative %: 67 %
Platelets: 286 10*3/uL (ref 150–400)
RBC: 3.82 MIL/uL — ABNORMAL LOW (ref 3.87–5.11)
RDW: 14.9 % (ref 11.5–15.5)
WBC: 8.5 10*3/uL (ref 4.0–10.5)
nRBC: 0 % (ref 0.0–0.2)

## 2023-10-07 LAB — COMPREHENSIVE METABOLIC PANEL
ALT: 17 U/L (ref 0–44)
AST: 19 U/L (ref 15–41)
Albumin: 4 g/dL (ref 3.5–5.0)
Alkaline Phosphatase: 49 U/L (ref 38–126)
Anion gap: 10 (ref 5–15)
BUN: 13 mg/dL (ref 8–23)
CO2: 25 mmol/L (ref 22–32)
Calcium: 9.7 mg/dL (ref 8.9–10.3)
Chloride: 103 mmol/L (ref 98–111)
Creatinine, Ser: 0.67 mg/dL (ref 0.44–1.00)
GFR, Estimated: >60 mL/min (ref >60)
Glucose, Bld: 113 mg/dL — ABNORMAL HIGH (ref 70–99)
Potassium: 4.7 mmol/L (ref 3.5–5.1)
Sodium: 138 mmol/L (ref 135–145)
Total Bilirubin: 0.6 mg/dL (ref 0.0–1.2)
Total Protein: 7.4 g/dL (ref 6.5–8.1)

## 2023-10-07 LAB — LACTATE DEHYDROGENASE: LDH: 172 U/L (ref 98–192)

## 2023-10-13 NOTE — Progress Notes (Signed)
 [facility] [address] [place] [address]    Clinic Day:  10/14/2023  Referring physician: Assunta Found, MD  Patient Care Team: Assunta Found, MD as PCP - General (Family Medicine) Lanelle Bal, DO as Consulting Physician (Internal Medicine)   ASSESSMENT & PLAN:   Assessment: 1. [name]: - BMBX on 01/11/2018 shows hypercellular (50 to 70% cellularity) marrow with trilineage hematopoiesis.  Reticulin stain showed mild increase in reticulin fibers.  Chromosome analysis shows 45, XX. - No prior history of vasomotor symptoms or thrombosis.  Given her age more than 24, she was recommended to start on hydroxyurea. - She takes hydroxyurea 500 mg daily started on 12/26/2017, increased to 2 tablets daily on 02/07/2018. -On 07/22/2018, her platelet count dropped to 72.  Hence we will cut back the dose of Hydrea to 2 tablets on Monday Wednesdays and Fridays and 1 tablet on the other days. - Hydroxyurea dose reduced to 1 tablet daily on 09/19/2021, dose reduced to 1 tablet Monday through Friday on 05/22/2022   2.  Sleeplessness: - She is taking Xanax 0.25 mg as needed for sleep which is helping.   3.  Lung nodules: -Patient has a longtime smoking history. -CT of the chest on 03/25/2019 showed multiple small pulmonary nodules are again noted in the lungs bilaterally, largest of which is in the posterior aspect of the right upper lobe near the apex.  -CT of the chest on 04/02/2020 showed lung RADS 2 follow-up in 1 year    Plan: 1.  [name]: - She is tolerating hydroxyurea 1 tablet daily Monday through Friday very well. - Denies any aqua genic pruritus/erythromelalgia's. - Hospitalized from 09/11/2023 through 09/13/2023 for COPD exacerbation. - She reports slight itching on the back after shower which goes away within few minutes.  No prior history of thrombosis. - Reviewed labs from 10/07/2023: Normal LFTs.  Hematocrit 40 and platelet count  286.  White count is normal.  CBC in the target range.  Physical exam did not reveal any splenomegaly. - Continue hydroxyurea 500 mg daily Monday through Friday.  RTC 4 months for follow-up.   2.  Smoking history: - Last CT lung cancer screening scan on 05/21/2023 was BI-RADS Category 2 S with 1 year follow-up scan recommended.      Orders Placed This Encounter  Procedures   CBC with Differential    Standing Status:   Future    Expected Date:   02/08/2024    Expiration Date:   10/13/2024   Comprehensive metabolic panel    Standing Status:   Future    Expected Date:   02/08/2024    Expiration Date:   10/13/2024   Lactate dehydrogenase    Standing Status:   Future    Expected Date:   02/08/2024    Expiration Date:   10/13/2024      Mikeal Hawthorne R Teague,acting as a scribe for Doreatha Massed, MD.,have documented all relevant documentation on the behalf of Doreatha Massed, MD,as directed by  Doreatha Massed, MD while in the presence of Doreatha Massed, MD.  I, Doreatha Massed MD, have reviewed the above documentation for accuracy and completeness, and I agree with the above.     Doreatha Massed, MD   3/5/20254:03 PM  CHIEF COMPLAINT:   Diagnosis:  JAK2 positive polycythemia    Cancer Staging  No matching staging information was found for the patient.    Prior Therapy: none  Current Therapy:  hydrea    HISTORY OF PRESENT ILLNESS:   Oncology History   No history exists.     INTERVAL HISTORY:   Denise Maynard is a 68 y.o. female presenting to clinic today for follow up of  JAK2 positive polycythemia. She was last seen by me on 06/10/23.  Since her last visit, she was admitted to the hospital from 09/11/23 to 09/13/23 for COPD exacerbation, treated with IV steroids, neb treatments, and azithromycin.  Today, she states that she is doing well overall. Her appetite level is at 81%. Her energy level is at 75%.  PAST MEDICAL HISTORY:   Past Medical History: Past  Medical History:  Diagnosis Date   Carotid artery aneurysm (HCC)    Coil embolization of a supraclinoid right internal carotid artery - Dr. Conchita Paris   Hyperlipidemia    Nicotine addiction 05/15/2015   Will try patch   Serum calcium elevated 11/19/2017   recheck   Serum potassium elevated 11/19/2017   Stroke [facility])     Surgical History: Past Surgical History:  Procedure Laterality Date   ABDOMINAL AORTAGRAM N/A 11/24/2014   Procedure: ABDOMINAL Ronny Flurry;  Surgeon: Sherren Kerns, MD;  Location: [facility] CATH LAB;  Service: Cardiovascular;  Laterality: N/A;   Aneursym repair  10/2014   COLONOSCOPY N/A 07/30/2015   Procedure: COLONOSCOPY;  Surgeon: West Bali, MD;  Location: [facility] SUITE;  Service: Endoscopy;  Laterality: N/A;  1:00 Pm - moved to 1:30 - office to notify   FRACTURE SURGERY     Jaw   LOWER EXTREMITY ANGIOGRAM  11/24/2014   Procedure: LOWER EXTREMITY ANGIOGRAM;  Surgeon: Sherren Kerns, MD;  Location: [facility] C[facility] LAB;  Service: Cardiovascular;;   MOUTH SURGERY     RADIOLOGY WITH ANESTHESIA N/A 10/12/2014   Procedure: Embolization/arteriogram;  Surgeon: Lisbeth Renshaw, MD;  Location: [facility] OR;  Service: Radiology;  Laterality: N/A;    Social History: Social History   Socioeconomic History   Marital status: Divorced    Spouse name: Not on file   Number of children: Not on file   Years of education: Not on file   Highest education level: Not on file  Occupational History   Not on file  Tobacco Use   Smoking status: Every Day    Current packs/day: 0.50    Average packs/day: 0.5 packs/day for 30.0 years (15.0 ttl pk-yrs)    Types: Cigarettes   Smokeless tobacco: Never  Substance and Sexual Activity   Alcohol use: No    Alcohol/week: 0.0 standard drinks of alcohol   Drug use: No   Sexual activity: Yes    Birth control/protection: Post-menopausal  Other Topics Concern   Not on file  Social History Narrative   Not on file   Social Drivers of Health   Financial  Resource Strain: Not on file  Food Insecurity: No Food Insecurity (09/12/2023)   Hunger Vital Sign    Worried About Running Out of Food in the Last Year: Never true    Ran Out of Food in the Last Year: Never true  Transportation Needs: No Transportation Needs (09/12/2023)   PRAPARE - Administrator, Civil Service (Medical): No    Lack of Transportation (Non-Medical): No  Physical Activity: Not on file  Stress: Not on file  Social Connections: Not on file  Intimate Partner Violence: Not At Risk (09/12/2023)   Humiliation, Afraid, Rape, and Kick questionnaire    Fear of Current or Ex-Partner: No    Emotionally Abused: No  Physically Abused: No    Sexually Abused: No    Family History: Family History  Problem Relation Age of Onset   Heart attack Mother    Diabetes Mother    Throat cancer Father    Hypertension Father    Hyperlipidemia Sister    Diabetes Maternal Grandmother    Cancer Maternal Grandfather    Heart attack Paternal Grandfather    Hyperlipidemia Sister     Current Medications:  Current Outpatient Medications:    acetaminophen (TYLENOL) 500 MG tablet, Take 1,000 mg by mouth every 6 (six) hours as needed for mild pain or moderate pain., Disp: , Rfl:    albuterol (VENTOLIN HFA) 108 (90 Base) MCG/ACT inhaler, Inhale 2 puffs into the lungs every 6 (six) hours as needed for wheezing or shortness of breath., Disp: 8 g, Rfl: 2   alendronate (FOSAMAX) 70 MG tablet, Take 70 mg by mouth once a week., Disp: , Rfl:    ALPRAZolam (XANAX) 0.5 MG tablet, Take 1 tablet (0.5 mg total) by mouth at bedtime. (Patient taking differently: Take 0.25 mg by mouth at bedtime as needed for sleep or anxiety.), Disp: 30 tablet, Rfl: 0   Artificial Tear Ointment (DRY EYES OP), Apply 1 drop to eye daily., Disp: , Rfl:    aspirin EC 81 MG tablet, Take 81 mg by mouth daily., Disp: , Rfl:    benzonatate (TESSALON) 200 MG capsule, Take 200 mg by mouth 3 (three) times daily as needed for  cough., Disp: , Rfl:    bismuth subsalicylate (PEPTO BISMOL) 262 MG/15ML suspension, Take 30 mLs by mouth every 6 (six) hours as needed for indigestion or diarrhea or loose stools., Disp: , Rfl:    Calcium Carb-Cholecalciferol (CALCIUM+D3) 500-15 MG-MCG TABS, Take 1 tablet by mouth daily., Disp: , Rfl:    Cholecalciferol (VITAMIN D) 50 MCG (2000 UT) tablet, Take 2,000 Units by mouth daily., Disp: , Rfl:    hydroxyurea (HYDREA) 500 MG capsule, TAKE 1 CAPSULE BY MOUTH DAILY  MAY TAKE WITH FOOD TO MINIMIZE  GI SIDE EFFECTS (Patient taking differently: Take 500 mg by mouth daily. Monday through Friday), Disp: 30 capsule, Rfl: 3   Multiple Vitamins-Minerals (MULTIVITAMIN WITH MINERALS) tablet, Take 1 tablet by mouth daily., Disp: , Rfl:    Omega-3 Fatty Acids (FISH OIL) 500 MG CAPS, Take 500 mg by mouth once. , Disp: , Rfl:    PSEUDOEPH-BROMPHEN-HYDROCODONE PO, Take 7.5 mLs by mouth 2 (two) times daily as needed (cough)., Disp: , Rfl:    rosuvastatin (CRESTOR) 5 MG tablet, Take 5 mg by mouth daily., Disp: , Rfl:    Allergies: No Known Allergies  REVIEW OF SYSTEMS:   Review of Systems  Constitutional:  Negative for chills, fatigue and fever.  HENT:   Negative for lump/mass, mouth sores, nosebleeds, sore throat and trouble swallowing.   Eyes:  Negative for eye problems.  Respiratory:  Negative for cough and shortness of breath.   Cardiovascular:  Negative for chest pain, leg swelling and palpitations.  Gastrointestinal:  Negative for abdominal pain, constipation, diarrhea, nausea and vomiting.  Genitourinary:  Negative for bladder incontinence, difficulty urinating, dysuria, frequency, hematuria and nocturia.   Musculoskeletal:  Negative for arthralgias, back pain, flank pain, myalgias and neck pain.  Skin:  Negative for itching and rash.  Neurological:  Negative for dizziness, headaches and numbness.  Hematological:  Does not bruise/bleed easily.  Psychiatric/Behavioral:  Positive for sleep  disturbance. Negative for depression and suicidal ideas. The patient is not nervous/anxious.  All other systems reviewed and are negative.    VITALS:   Blood pressure 126/66, pulse (!) 55, temperature 97.9 F (36.6 C), temperature source Tympanic, resp. rate 17, height 5' 4.5" (1.638 m), weight 138 lb (62.6 kg), SpO2 100%.  Wt Readings from Last 3 Encounters:  10/14/23 138 lb (62.6 kg)  09/12/23 134 lb 14.7 oz (61.2 kg)  01/26/23 142 lb 11.2 oz (64.7 kg)    Body mass index is 23.32 kg/m.  Performance status (ECOG): 1 - Symptomatic but completely ambulatory  PHYSICAL EXAM:   Physical Exam Vitals and nursing note reviewed. Exam conducted with a chaperone present.  Constitutional:      Appearance: Normal appearance.  Cardiovascular:     Rate and Rhythm: Normal rate and regular rhythm.     Pulses: Normal pulses.     Heart sounds: Normal heart sounds.  Pulmonary:     Effort: Pulmonary effort is normal.     Breath sounds: Normal breath sounds.  Abdominal:     Palpations: Abdomen is soft. There is no hepatomegaly, splenomegaly or mass.     Tenderness: There is no abdominal tenderness.  Musculoskeletal:     Right lower leg: No edema.     Left lower leg: No edema.  Lymphadenopathy:     Cervical: No cervical adenopathy.     Right cervical: No superficial, deep or posterior cervical adenopathy.    Left cervical: No superficial, deep or posterior cervical adenopathy.     Upper Body:     Right upper body: No supraclavicular or axillary adenopathy.     Left upper body: No supraclavicular or axillary adenopathy.  Neurological:     General: No focal deficit present.     Mental Status: She is alert and oriented to person, place, and time.  Psychiatric:        Mood and Affect: Mood normal.        Behavior: Behavior normal.     LABS:      Latest Ref Rng & Units 10/07/2023    1:08 PM 09/12/2023    4:22 AM 09/11/2023    6:00 PM  CBC  WBC 4.0 - 10.5 K/uL 8.5  8.7  11.5    Hemoglobin 12.0 - 15.0 g/dL 40.9  81.1  91.4   Hematocrit 36.0 - 46.0 % 40.6  35.7  43.5   Platelets 150 - 400 K/uL 286  272  310       Latest Ref Rng & Units 10/07/2023    1:08 PM 09/13/2023    6:57 AM 09/12/2023    4:22 AM  CMP  Glucose 70 - 99 mg/dL 782  956  213   BUN 8 - 23 mg/dL 13  13  18    Creatinine 0.44 - 1.00 mg/dL 0.86  5.78  4.69   Sodium 135 - 145 mmol/L 138  141  139   Potassium 3.5 - 5.1 mmol/L 4.7  5.1  3.5   Chloride 98 - 111 mmol/L 103  106  107   CO2 22 - 32 mmol/L 25  23  23    Calcium 8.9 - 10.3 mg/dL 9.7  9.3  8.6   Total Protein 6.5 - 8.1 g/dL 7.4     Total Bilirubin 0.0 - 1.2 mg/dL 0.6     Alkaline Phos 38 - 126 U/L 49     AST 15 - 41 U/L 19     ALT 0 - 44 U/L 17        No results  found for: "CEA1", "CEA" / No results found for: "CEA1", "CEA" No results found for: "PSA1" No results found for: "VHQ469" No results found for: "CAN125"  No results found for: "TOTALPROTELP", "ALBUMINELP", "A1GS", "A2GS", "BETS", "BETA2SER", "GAMS", "MSPIKE", "SPEI" No results found for: "TIBC", "FERRITIN", "IRONPCTSAT" Lab Results  Component Value Date   LDH 172 10/07/2023   LDH 213 (H) 05/13/2023   LDH 195 (H) 01/26/2023     STUDIES:   No results found.

## 2023-10-14 ENCOUNTER — Inpatient Hospital Stay: Payer: Medicare Other | Attending: Hematology | Admitting: Hematology

## 2023-10-14 VITALS — BP 126/66 | HR 55 | Temp 97.9°F | Resp 17 | Ht 64.5 in | Wt 138.0 lb

## 2023-10-14 DIAGNOSIS — Z79899 Other long term (current) drug therapy: Secondary | ICD-10-CM | POA: Insufficient documentation

## 2023-10-14 DIAGNOSIS — D471 Chronic myeloproliferative disease: Secondary | ICD-10-CM | POA: Diagnosis not present

## 2023-10-14 DIAGNOSIS — D45 Polycythemia vera: Secondary | ICD-10-CM | POA: Diagnosis not present

## 2023-10-14 DIAGNOSIS — Z7964 Long term (current) use of myelosuppressive agent: Secondary | ICD-10-CM | POA: Diagnosis not present

## 2023-10-14 DIAGNOSIS — Z7982 Long term (current) use of aspirin: Secondary | ICD-10-CM | POA: Insufficient documentation

## 2023-10-14 DIAGNOSIS — F17210 Nicotine dependence, cigarettes, uncomplicated: Secondary | ICD-10-CM | POA: Insufficient documentation

## 2023-10-14 DIAGNOSIS — G4700 Insomnia, unspecified: Secondary | ICD-10-CM | POA: Diagnosis not present

## 2023-10-14 DIAGNOSIS — R918 Other nonspecific abnormal finding of lung field: Secondary | ICD-10-CM | POA: Diagnosis not present

## 2023-10-14 NOTE — Patient Instructions (Signed)
 [facility] at [facility] Discharge Instructions   You were seen and examined today by Dr. Ellin Saba.  He reviewed the results of your lab work which are normal/stable.   We will see you back in 4 months. We will repeat lab work prior to this visit.   Return as scheduled.    Thank you for choosing [facility] at [facility] to provide your oncology and hematology care.  To afford each patient quality time with our provider, please arrive at least 15 minutes before your scheduled appointment time.   If you have a lab appointment with the Cancer Center please come in thru the Main Entrance and check in at the main information desk.  You need to re-schedule your appointment should you arrive 10 or more minutes late.  We strive to give you quality time with our providers, and arriving late affects you and other patients whose appointments are after yours.  Also, if you no show three or more times for appointments you may be dismissed from the clinic at the providers discretion.     Again, thank you for choosing [facility].  Our hope is that these requests will decrease the amount of time that you wait before being seen by our physicians.       _____________________________________________________________  Should you have questions after your visit to [facility], please contact our office at [phone] and follow the prompts.  Our office hours are 8:00 a.m. and 4:30 p.m. Monday - Friday.  Please note that voicemails left after 4:00 p.m. may not be returned until the following business day.  We are closed weekends and major holidays.  You do have access to a nurse 24-7, just call the main number to the clinic [phone] and do not press any options, hold on the line and a nurse will answer the phone.    For prescription refill requests, have your pharmacy contact our office and allow 72 hours.    Due to Covid, you will  need to wear a mask upon entering the hospital. If you do not have a mask, a mask will be given to you at the Main Entrance upon arrival. For doctor visits, patients may have 1 support person age 73 or older with them. For treatment visits, patients can not have anyone with them due to social distancing guidelines and our immunocompromised population.

## 2023-10-20 ENCOUNTER — Ambulatory Visit: Payer: Medicare Other | Admitting: Internal Medicine

## 2023-10-20 ENCOUNTER — Encounter: Payer: Self-pay | Admitting: Internal Medicine

## 2023-10-20 VITALS — BP 144/82 | HR 75 | Ht 64.5 in | Wt 141.2 lb

## 2023-10-20 DIAGNOSIS — F17210 Nicotine dependence, cigarettes, uncomplicated: Secondary | ICD-10-CM

## 2023-10-20 DIAGNOSIS — J4489 Other specified chronic obstructive pulmonary disease: Secondary | ICD-10-CM | POA: Diagnosis not present

## 2023-10-20 NOTE — Progress Notes (Signed)
 Denise Maynard, female    DOB: [birth year]    MRN: [id number]   Brief patient profile:  39  yowm active smoker  referred to pulmonary clinic in [place]  10/20/2023 by Dr Phillips Odor  for aecopd started [facility] Jan 2025  Jan 29thrx zpak / pred > admitted  Sep 11 2023    Admit date: 09/11/2023 Discharge date: 09/13/2023  Home Health: No needs identified by PT Equipment/Devices: None   Discharge Condition: Stable CODE STATUS: Full code Diet recommendation: Regular diet   History of present illness:   Pt has  past medical history significant for COPD, polycythemia vera, continued tobacco use disorder who presented to [facility], ED on 1/31 from home via EMS with progressive shortness of breath, cough, chest congestion and chest tightness.  Onset 2  week prior.  Symptoms have been progressively worsening over the last 3 days.  Endorses scant white sputum but mainly nonproductive.  Was seen by outpatient clinic day prior to admission where she was diagnosed with URI and prescribed azithromycin.  Denies headache, no fever, no chills, no chest pain, no palpitations, no abdominal pain, no focal weakness, no fatigue, no paresthesia.   In the ED, temperature 98.4 F, HR 62, RR 21, BP 145/86, SpO2 90% on 2 L nasal cannula.  WBC 11.5, hemoglobin 14.4, platelet count 310.  Sodium 143, potassium 3.7, chloride 106, CO2 23, glucose 103, BUN 17, creatinine 0.77.  High sensitive troponin 6> 8.  Influenza A/B PCR negative, RSV/COVID PCR negative.  Chest x-ray with hyperinflation, chronic changes, no acute consolidation or effusion.  In the ED, patient was started on systemic steroids, breathing treatments and supplemental oxygen.  TRH consulted for admission for further evaluation management of acute COPD exacerbation.   Hospital course:   COPD exacerbation Patient presenting to ED with 1 week history of progressive shortness of breath associated with mildly productive cough, chest congestion/tightness.  Was seen  outpatient and prescribed azithromycin for concern of URI.  On arrival to the ED patient tachypneic requiring 2 L nasal cannula which she is not on baseline.  Continues to endorse continued tobacco use and denies any home inhaler use.  Patient is afebrile with slightly elevated WBC count.  Chest x-ray with no focal consolidation.  Patient was started on IV steroids, scheduled neb treatments.  Patient was able to be titrated off Esimil oxygen.  Symptoms markedly improved and will discharge to complete antibiotic course with azithromycin previously prescribed by PCP as well as steroid taper.  Discussed with patient extensively needs tobacco cessation.  Also discussed referral to pulmonology, outpatient PFTs and need to likely initiate chronic inhaled therapy for underlying COPD but wishes to discuss with her PCP outpatient.   Polycythemia vera Follows with medical oncology sees hematology, Dr. Ellin Saba.  Resume home Hydrea on discharge.   HLD Crestor 5 mg p.o. daily   Anxiety Alprazolam 0.25 mg p.o. nightly as needed sleep/anxiety   Tobacco use disorder Patient continues to endorse 1 pack/day, counseled on need for complete cessation/abstinence.   Weakness/debility/deconditioning: Seen by physical therapy, no needs identified.   Discharge Diagnoses:  Principal Problem:   COPD exacerbation (HCC)   Polycythemia vera (HCC)         History of Present Illness  10/20/2023  Pulmonary/ 1st office eval/ Pres[facility] Depasquale / [place] Office  Chief Complaint  Patient presents with   Consult  Dyspnea: Not limited by breathing from desired activities  "we'll see when yard work starts" Cough: none  Sleep: flat  bed one pillow no resp cc SABA use: none  02: none     No obvious day to day or daytime pattern/variability or assoc excess/ purulent sputum or mucus plugs or hemoptysis or cp or chest tightness, subjective wheeze or overt sinus or hb symptoms.    Also denies any obvious fluctuation of symptoms  with weather or environmental changes or other aggravating or alleviating factors except as outlined above   No unusual exposure hx or h/o childhood pna/ asthma or knowledge of premature birth.  Current Allergies, Complete Past Medical History, Past Surgical History, Family History, and Social History were reviewed in [employer] record.  ROS  The following are not active complaints unless bolded Hoarseness, sore throat, dysphagia, dental problems, itching, sneezing,  nasal congestion or discharge of excess mucus or purulent secretions, ear ache,   fever, chills, sweats, unintended wt loss or wt gain, classically pleuritic or exertional cp,  orthopnea pnd or arm/hand swelling  or leg swelling, presyncope, palpitations, abdominal pain, anorexia, nausea, vomiting, diarrhea  or change in bowel habits or change in bladder habits, change in stools or change in urine, dysuria, hematuria,  rash, arthralgias, visual complaints, headache, numbness, weakness or ataxia or problems with walking or coordination,  change in mood or  memory.            Outpatient Medications Prior to Visit  Medication Sig Dispense Refill   acetaminophen (TYLENOL) 500 MG tablet Take 1,000 mg by mouth every 6 (six) hours as needed for mild pain or moderate pain.     albuterol (VENTOLIN HFA) 108 (90 Base) MCG/ACT inhaler Inhale 2 puffs into the lungs every 6 (six) hours as needed for wheezing or shortness of breath. 8 g 2   alendronate (FOSAMAX) 70 MG tablet Take 70 mg by mouth once a week.     ALPRAZolam (XANAX) 0.5 MG tablet Take 1 tablet (0.5 mg total) by mouth at bedtime. (Patient taking differently: Take 0.25 mg by mouth at bedtime as needed for sleep or anxiety.) 30 tablet 0   Artificial Tear Ointment (DRY EYES OP) Apply 1 drop to eye daily.     aspirin EC 81 MG tablet Take 81 mg by mouth daily.     benzonatate (TESSALON) 200 MG capsule Take 200 mg by mouth 3 (three) times daily as needed for cough.      bismuth subsalicylate (PEPTO BISMOL) 262 MG/15ML suspension Take 30 mLs by mouth every 6 (six) hours as needed for indigestion or diarrhea or loose stools.     Calcium Carb-Cholecalciferol (CALCIUM+D3) 500-15 MG-MCG TABS Take 1 tablet by mouth daily.     Cholecalciferol (VITAMIN D) 50 MCG (2000 UT) tablet Take 2,000 Units by mouth daily.     hydroxyurea (HYDREA) 500 MG capsule TAKE 1 CAPSULE BY MOUTH DAILY  MAY TAKE WITH FOOD TO MINIMIZE  GI SIDE EFFECTS (Patient taking differently: Take 500 mg by mouth daily. Monday through Friday) 30 capsule 3   Multiple Vitamins-Minerals (MULTIVITAMIN WITH MINERALS) tablet Take 1 tablet by mouth daily.     Omega-3 Fatty Acids (FISH OIL) 500 MG CAPS Take 500 mg by mouth once.      PSEUDOEPH-BROMPHEN-HYDROCODONE PO Take 7.5 mLs by mouth 2 (two) times daily as needed (cough).     rosuvastatin (CRESTOR) 5 MG tablet Take 5 mg by mouth daily.     No facility-administered medications prior to visit.    Past Medical History:  Diagnosis Date   Carotid artery aneurysm (HCC)  Coil embolization of a supraclinoid right internal carotid artery - Dr. Conchita Paris   Hyperlipidemia    Nicotine addiction 05/15/2015   Will try patch   Serum calcium elevated 11/19/2017   recheck   Serum potassium elevated 11/19/2017   Stroke (HCC)       Objective:     BP (!) 144/82   Pulse 75   Ht 5' 4.5" (1.638 m)   Wt 141 lb 3.2 oz (64 kg)   SpO2 95% Comment: room air  BMI 23.86 kg/m   SpO2: 95 % (room air)  Somber amb wf nad   HEENT : Oropharynx  clear   Nasal turbinates nl    NECK :  without  apparent JVD/ palpable Nodes/TM    LUNGS: no acc muscle use,  Min barrel  contour chest wall with bilateral  slightly decreased bs s audible wheeze and  without cough on insp or exp maneuvers and min  Hyperresonant  to  percussion bilaterally    CV:  RRR  no s3 or murmur or increase in P2, and no edema   ABD:  soft and nontender with pos end  insp Hoover's  in the supine  position.  No bruits or organomegaly appreciated   MS:  Nl gait/ ext warm without deformities Or obvious joint restrictions  calf tenderness, cyanosis or clubbing     SKIN: warm and dry without lesions    NEURO:  alert, approp, nl sensorium with  no motor or cerebellar deficits apparent.           . I personally reviewed images and agree with radiology impression as follows:  CXR:   pa and lateral 1//31/25  Hyperinflation. Chronic changes. No acute consolidation or effusion.   LDSCT  05/21/23  mild centrilobular and paraseptal emphysema   Assessment   Asthmatic bronchitis , chronic (HCC) Active smoker with baseline excellent ex tol  - LDSCT  05/21/23  mild centrilobular and paraseptal emphysema  - s/p AB flare > admit 09/11/23  - 10/20/2023  After extensive coaching inhaler device,  effectiveness =    60% from a baseline near 0 so use saba prn return for pfts for worse symptoms or need  for saba   She is back to baseline = MMRC= 0  ie no limiting doe on no rx and is not interested in maint rx if she can avoid it.  Therefore rx for now as Group A copd with prn saba  Re SABA :  I spent extra time with pt today reviewing appropriate use of albuterol for prn use on exertion with the following points: 1) saba is for relief of sob that does not improve by walking a slower pace or resting but rather if the pt does not improve after trying this first. 2) If the pt is convinced, as many are, that saba helps recover from activity faster then it's easy to tell if this is the case by re-challenging : ie stop, take the inhaler, then p 5 minutes try the exact same activity (intensity of workload) that just caused the symptoms and see if they are substantially diminished or not after saba 3) if there is an activity that reproducibly causes the symptoms, try the saba 15 min before the activity on alternate days   If in fact the saba really does help, then fine to continue to use it prn but advised  may need to look closer at the maintenance regimen being used to achieve better control of airways  disease with exertion.   Cigarette smoker Counseled re importance of smoking cessation but did not meet time criteria for separate billing    Suggested e-cigs as an optional  "one way bridge"  Off all tobacco products    Each maintenance medication was reviewed in detail including emphasizing most importantly the difference between maintenance and prns and under what circumstances the prns are to be triggered using an action plan format where appropriate.  Total time for H and P, chart review, counseling, reviewing hfa device(s) and generating customized AVS unique to this office visit / same day charting = 45 min new pt eval           Sandrea Hughs, MD 10/20/2023

## 2023-10-20 NOTE — Assessment & Plan Note (Addendum)
 Active smoker with baseline excellent ex tol  - LDSCT  05/21/23  mild centrilobular and paraseptal emphysema  - s/p AB flare > admit 09/11/23  - 10/20/2023  After extensive coaching inhaler device,  effectiveness =    60% from a baseline near 0 so use saba prn return for pfts for worse symptoms or need  for saba   She is back to baseline = MMRC= 0  ie no limiting doe on no rx and is not interested in maint rx if she can avoid it.  Therefore rx for now as Group A copd with prn saba  Re SABA :  I spent extra time with pt today reviewing appropriate use of albuterol for prn use on exertion with the following points: 1) saba is for relief of sob that does not improve by walking a slower pace or resting but rather if the pt does not improve after trying this first. 2) If the pt is convinced, as many are, that saba helps recover from activity faster then it's easy to tell if this is the case by re-challenging : ie stop, take the inhaler, then p 5 minutes try the exact same activity (intensity of workload) that just caused the symptoms and see if they are substantially diminished or not after saba 3) if there is an activity that reproducibly causes the symptoms, try the saba 15 min before the activity on alternate days   If in fact the saba really does help, then fine to continue to use it prn but advised may need to look closer at the maintenance regimen being used to achieve better control of airways disease with exertion.

## 2023-10-20 NOTE — Assessment & Plan Note (Signed)
 Counseled re importance of smoking cessation but did not meet time criteria for separate billing    Suggested e-cigs as an optional  "one way bridge"  Off all tobacco products    Each maintenance medication was reviewed in detail including emphasizing most importantly the difference between maintenance and prns and under what circumstances the prns are to be triggered using an action plan format where appropriate.  Total time for H and P, chart review, counseling, reviewing hfa device(s) and generating customized AVS unique to this office visit / same day charting = 45 min new pt eval

## 2023-10-20 NOTE — Patient Instructions (Signed)
 Work on inhaler technique:  relax and gently blow all the way out then take a nice smooth full deep breath back in, triggering the inhaler at same time you start breathing in.  Hold breath in for at least  5 seconds if you can.    Only use your albuterol as a rescue medication to be used if you can't catch your breath by resting or doing a relaxed purse lip breathing pattern.  - The less you use it, the better it will work when you need it. - Ok to use up to 2 puffs  every 4 hours if you must but call for immediate appointment if use goes up over your usual need - Don't leave home without it !!  (think of it like the spare tire for your car)   Also  Ok to try albuterol 15 min before an activity (on alternating days)  that you know would usually make you short of breath and see if it makes any difference and if makes none then don't take albuterol after activity unless you can't catch your breath as this means it's the resting that helps, not the albuterol.  If you do find yourself using albuterol more, call my office to schedule PFTs at [facility]      The key is to stop smoking completely before smoking completely stops you!   Pulmonary follow up is as needed

## 2024-01-13 ENCOUNTER — Ambulatory Visit: Payer: Medicare Other | Attending: Internal Medicine | Admitting: Internal Medicine

## 2024-01-13 ENCOUNTER — Encounter: Payer: Self-pay | Admitting: Internal Medicine

## 2024-01-13 ENCOUNTER — Other Ambulatory Visit (HOSPITAL_COMMUNITY)
Admission: RE | Admit: 2024-01-13 | Discharge: 2024-01-13 | Disposition: A | Discharge status: Home / Self Care | Source: Ambulatory Visit | Attending: Internal Medicine | Admitting: Internal Medicine

## 2024-01-13 VITALS — BP 132/88 | HR 60 | Ht 67.0 in | Wt 146.6 lb

## 2024-01-13 DIAGNOSIS — I2510 Atherosclerotic heart disease of native coronary artery without angina pectoris: Secondary | ICD-10-CM

## 2024-01-13 DIAGNOSIS — Z8249 Family history of ischemic heart disease and other diseases of the circulatory system: Secondary | ICD-10-CM | POA: Diagnosis not present

## 2024-01-13 DIAGNOSIS — E785 Hyperlipidemia, unspecified: Secondary | ICD-10-CM | POA: Diagnosis not present

## 2024-01-13 DIAGNOSIS — Z7902 Long term (current) use of antithrombotics/antiplatelets: Secondary | ICD-10-CM | POA: Diagnosis not present

## 2024-01-13 NOTE — Progress Notes (Signed)
 Cardiology Office Note  Date: 01/13/2024   ID: Areal, Cochrane 12-17-1955, MRN [id number]  PCP:  Minus Amel, MD  Cardiologist:  None Electrophysiologist:  None   History of Present Illness: Denise Maynard is a 68 y.o. female  Referred for coronary calcifications on CT scan.  CT chest lung cancer screening in October 2024 showed calcified atherosclerotic plaque in the left main, LAD and RCA.  Patient seen symptomatic, no angina or DOE.  Active at baseline.  No dizziness, palpitations, syncope or leg swelling.  She has family history of premature CAD, her mother had MI and CABG in early 42s.  Quit smoking cigarettes.  Currently on aspirin  81 mg once daily and rosuvastatin  5 mg nightly.  I reviewed her lipid panel from couple of years ago that showed LDL in 130s.  But she reported that she had blood work done recently.    Past Medical History:  Diagnosis Date   Carotid artery aneurysm (HCC)    Coil embolization of a supraclinoid right internal carotid artery - Dr. Nat Badger   Hyperlipidemia    Nicotine  addiction 05/15/2015   Will try patch   Serum calcium  elevated 11/19/2017   recheck   Serum potassium elevated 11/19/2017   Stroke [facility])     Past Surgical History:  Procedure Laterality Date   ABDOMINAL AORTAGRAM N/A 11/24/2014   Procedure: ABDOMINAL Tommi Fraise;  Surgeon: Richrd Char, MD;  Location: [facility] CATH LAB;  Service: Cardiovascular;  Laterality: N/A;   Aneursym repair  10/2014   COLONOSCOPY N/A 07/30/2015   Procedure: COLONOSCOPY;  Surgeon: Alyce Jubilee, MD;  Location: [facility] ENDO SUITE;  Service: Endoscopy;  Laterality: N/A;  1:00 Pm - moved to 1:30 - office to notify   FRACTURE SURGERY     Jaw   LOWER EXTREMITY ANGIOGRAM  11/24/2014   Procedure: LOWER EXTREMITY ANGIOGRAM;  Surgeon: Richrd Char, MD;  Location: [facility] CATH LAB;  Service: Cardiovascular;;   MOUTH SURGERY     RADIOLOGY WITH ANESTHESIA N/A 10/12/2014   Procedure: Embolization/arteriogram;  Surgeon:  Augusto Blonder, MD;  Location: [facility] OR;  Service: Radiology;  Laterality: N/A;    Current Outpatient Medications  Medication Sig Dispense Refill   acetaminophen  (TYLENOL ) 500 MG tablet Take 1,000 mg by mouth every 6 (six) hours as needed for mild pain or moderate pain.     albuterol  (VENTOLIN  HFA) 108 (90 Base) MCG/ACT inhaler Inhale 2 puffs into the lungs every 6 (six) hours as needed for wheezing or shortness of breath. 8 g 2   alendronate (FOSAMAX) 70 MG tablet Take 70 mg by mouth once a week.     Artificial Tear Ointment (DRY EYES OP) Apply 1 drop to eye daily.     aspirin  EC 81 MG tablet Take 81 mg by mouth daily.     bismuth  subsalicylate (PEPTO BISMOL) 262 MG/15ML suspension Take 30 mLs by mouth every 6 (six) hours as needed for indigestion or diarrhea or loose stools.     Calcium  Carb-Cholecalciferol (CALCIUM +D3) 500-15 MG-MCG TABS Take 1 tablet by mouth daily.     Cholecalciferol (VITAMIN D ) 50 MCG (2000 UT) tablet Take 2,000 Units by mouth daily.     hydroxyurea  (HYDREA ) 500 MG capsule TAKE 1 CAPSULE BY MOUTH DAILY  MAY TAKE WITH FOOD TO MINIMIZE  GI SIDE EFFECTS (Patient taking differently: Take 500 mg by mouth daily. Monday through Friday) 30 capsule 3   Multiple Vitamins-Minerals (MULTIVITAMIN WITH MINERALS) tablet Take 1 tablet by mouth daily.  Omega-3 Fatty Acids (FISH OIL) 500 MG CAPS Take 500 mg by mouth once.      PSEUDOEPH-BROMPHEN-HYDROCODONE  PO Take 7.5 mLs by mouth 2 (two) times daily as needed (cough).     rosuvastatin  (CRESTOR ) 5 MG tablet Take 5 mg by mouth daily.     ALPRAZolam  (XANAX ) 0.5 MG tablet Take 1 tablet (0.5 mg total) by mouth at bedtime. (Patient not taking: Reported on 01/13/2024) 30 tablet 0   benzonatate (TESSALON) 200 MG capsule Take 200 mg by mouth 3 (three) times daily as needed for cough. (Patient not taking: Reported on 01/13/2024)     No current facility-administered medications for this visit.   Allergies:  Patient has no known allergies.    Social History: The patient  reports that she has been smoking cigarettes. She has a 15 pack-year smoking history. She has never used smokeless tobacco. She reports that she does not drink alcohol and does not use drugs.   Family History: The patient's family history includes Cancer in her maternal grandfather; Diabetes in her maternal grandmother and mother; Heart attack in her mother and paternal grandfather; Hyperlipidemia in her sister and sister; Hypertension in her father; Throat cancer in her father.   ROS:  Please see the history of present illness. Otherwise, complete review of systems is positive for none.  All other systems are reviewed and negative.   Physical Exam: VS:  BP 132/88 (BP Location: Left Arm, Patient Position: Sitting, Cuff Size: Normal)   Pulse 60   Ht 5\' 7"  (1.702 m)   Wt 146 lb 9.6 oz (66.5 kg)   SpO2 94%   BMI 22.96 kg/m , BMI Body mass index is 22.96 kg/m.  Wt Readings from Last 3 Encounters:  01/13/24 146 lb 9.6 oz (66.5 kg)  10/20/23 141 lb 3.2 oz (64 kg)  10/14/23 138 lb (62.6 kg)    General: Patient appears comfortable at rest. HEENT: Conjunctiva and lids normal, oropharynx clear with moist mucosa. Neck: Supple, no elevated JVP or carotid bruits, no thyromegaly. Lungs: Clear to auscultation, nonlabored breathing at rest. Cardiac: Regular rate and rhythm, no S3 or significant systolic murmur, no pericardial rub. Abdomen: Soft, nontender, no hepatomegaly, bowel sounds present, no guarding or rebound. Extremities: No pitting edema, distal pulses 2+. Skin: Warm and dry. Musculoskeletal: No kyphosis. Neuropsychiatric: Alert and oriented x3, affect grossly appropriate.  Recent Labwork: 09/13/2023: Magnesium  2.3 10/07/2023: ALT 17; AST 19; BUN 13; Creatinine, Ser 0.67; Hemoglobin 13.3; Platelets 286; Potassium 4.7; Sodium 138     Component Value Date/Time   CHOL 241 (H) 01/03/2021 0816   CHOL 249 (H) 11/17/2017 1013   TRIG 157 (H) 01/03/2021 0816    HDL 71 01/03/2021 0816   HDL 62 11/17/2017 1013   CHOLHDL 3.4 01/03/2021 0816   VLDL 31 01/03/2021 0816   LDLCALC 139 (H) 01/03/2021 0816   LDLCALC 151 (H) 11/17/2017 1013     Assessment and Plan:  Imaging evidence of coronary calcifications: Asymptomatic, no angina or DOE.  CT chest lung cancer screening from October 2024 showed calcified atherosclerotic plaque in left main, LAD and RCA.  Due to left main plaque, will continue aspirin  81 mg once daily and obtain CT calcium  scoring of coronaries.  Continue rosuvastatin  5 mg at bedtime.  Goal LDL is 100.  However if calcium  score is more than 300, LDL goal will be less than 70.  Discussed symptoms of MI and CAD.  Heart healthy diet and exercise recommendations provided.  Patient verbalized understanding.  Hyperlipidemia, not at goal: Continue rosuvastatin  5 mg at bedtime.  No myalgias.  Can follow with PCP.  Goal LDL less than 100.  LDL goal will change if the CAC score is more than 300.  Obtain lipoprotein a levels.  If lipoprotein a levels are significantly elevated, she will benefit from PCSK9 inhibitors rather than statins.  Family history premature CAD: Mother had CABG in early 69s.  Obtain lipoprotein a levels.    Medication Adjustments/Labs and Tests Ordered: Current medicines are reviewed at length with the patient today.  Concerns regarding medicines are outlined above.    Disposition:  Follow up as needed  Signed, Richie Bonanno Beauford Bounds, MD, 01/13/2024 8:50 AM    [facility] at [facility] [address], [place], [place] [address]

## 2024-01-13 NOTE — Patient Instructions (Addendum)
 Medication Instructions:  Your physician recommends that you continue on your current medications as directed. Please refer to the Current Medication list given to you today.  *If you need a refill on your cardiac medications before your next appointment, please call your pharmacy*  Lab Work: LP-a  If you have labs (blood work) drawn today and your tests are completely normal, you will receive your results only by: MyChart Message (if you have MyChart) OR A paper copy in the mail If you have any lab test that is abnormal or we need to change your treatment, we will call you to review the results.  Testing/Procedures: Calcium  Score- Self Pay  Follow-Up: At [facility], you and your health needs are our priority.  As part of our continuing mission to provide you with exceptional heart care, our providers are all part of one team.  This team includes your primary Cardiologist (physician) and Advanced Practice Providers or APPs (Physician Assistants and Nurse Practitioners) who all work together to provide you with the care you need, when you need it.  Your next appointment:    Follow up as needed.   Provider:   You may see Vishnu Mallipeddi, MD or one of the following Advanced Practice Providers on your designated Care Team:   [place], PA-C  [place], [place] Theotis Flake, [place]     We recommend signing up for the patient portal called "MyChart".  Sign up information is provided on this After Visit Summary.  MyChart is used to connect with patients for Virtual Visits (Telemedicine).  Patients are able to view lab/test results, encounter notes, upcoming appointments, etc.  Non-urgent messages can be sent to your provider as well.   To learn more about what you can do with MyChart, go to [email or web address].   Other Instructions

## 2024-01-15 ENCOUNTER — Ambulatory Visit: Payer: Self-pay | Admitting: Internal Medicine

## 2024-01-15 LAB — LIPOPROTEIN A (LPA): Lipoprotein (a): <8.4 nmol/L (ref <75.0)

## 2024-02-17 ENCOUNTER — Inpatient Hospital Stay: Attending: Hematology

## 2024-02-17 DIAGNOSIS — D45 Polycythemia vera: Secondary | ICD-10-CM | POA: Insufficient documentation

## 2024-02-17 DIAGNOSIS — Z87891 Personal history of nicotine dependence: Secondary | ICD-10-CM | POA: Insufficient documentation

## 2024-02-17 DIAGNOSIS — D471 Chronic myeloproliferative disease: Secondary | ICD-10-CM

## 2024-02-17 DIAGNOSIS — Z79899 Other long term (current) drug therapy: Secondary | ICD-10-CM | POA: Diagnosis not present

## 2024-02-17 LAB — CBC WITH DIFFERENTIAL/PLATELET
Abs Immature Granulocytes: 0.03 K/uL (ref 0.00–0.07)
Basophils Absolute: 0 K/uL (ref 0.0–0.1)
Basophils Relative: 1 %
Eosinophils Absolute: 0.1 K/uL (ref 0.0–0.5)
Eosinophils Relative: 1 %
HCT: 41.1 % (ref 36.0–46.0)
Hemoglobin: 13.8 g/dL (ref 12.0–15.0)
Immature Granulocytes: 1 %
Lymphocytes Relative: 23 %
Lymphs Abs: 1.4 K/uL (ref 0.7–4.0)
MCH: 35.9 pg — ABNORMAL HIGH (ref 26.0–34.0)
MCHC: 33.6 g/dL (ref 30.0–36.0)
MCV: 107 fL — ABNORMAL HIGH (ref 80.0–100.0)
Monocytes Absolute: 0.7 K/uL (ref 0.1–1.0)
Monocytes Relative: 12 %
Neutro Abs: 3.7 K/uL (ref 1.7–7.7)
Neutrophils Relative %: 62 %
Platelets: 315 K/uL (ref 150–400)
RBC: 3.84 MIL/uL — ABNORMAL LOW (ref 3.87–5.11)
RDW: 14.1 % (ref 11.5–15.5)
WBC: 5.9 K/uL (ref 4.0–10.5)
nRBC: 0 % (ref 0.0–0.2)

## 2024-02-17 LAB — COMPREHENSIVE METABOLIC PANEL WITH GFR
ALT: 18 U/L (ref 0–44)
AST: 24 U/L (ref 15–41)
Albumin: 4 g/dL (ref 3.5–5.0)
Alkaline Phosphatase: 52 U/L (ref 38–126)
Anion gap: 8 (ref 5–15)
BUN: 10 mg/dL (ref 8–23)
CO2: 27 mmol/L (ref 22–32)
Calcium: 9.2 mg/dL (ref 8.9–10.3)
Chloride: 105 mmol/L (ref 98–111)
Creatinine, Ser: 0.69 mg/dL (ref 0.44–1.00)
GFR, Estimated: >60 mL/min (ref >60)
Glucose, Bld: 100 mg/dL — ABNORMAL HIGH (ref 70–99)
Potassium: 4.9 mmol/L (ref 3.5–5.1)
Sodium: 140 mmol/L (ref 135–145)
Total Bilirubin: 1 mg/dL (ref 0.0–1.2)
Total Protein: 7.4 g/dL (ref 6.5–8.1)

## 2024-02-17 LAB — LACTATE DEHYDROGENASE: LDH: 173 U/L (ref 98–192)

## 2024-02-23 ENCOUNTER — Other Ambulatory Visit: Payer: Self-pay | Admitting: Hematology

## 2024-02-23 ENCOUNTER — Inpatient Hospital Stay: Admitting: Hematology

## 2024-02-23 VITALS — BP 137/78 | HR 61 | Temp 98.2°F | Resp 16 | Wt 147.7 lb

## 2024-02-23 DIAGNOSIS — D45 Polycythemia vera: Secondary | ICD-10-CM | POA: Diagnosis not present

## 2024-02-23 DIAGNOSIS — D471 Chronic myeloproliferative disease: Secondary | ICD-10-CM | POA: Diagnosis not present

## 2024-02-23 DIAGNOSIS — Z122 Encounter for screening for malignant neoplasm of respiratory organs: Secondary | ICD-10-CM

## 2024-02-23 DIAGNOSIS — Z87891 Personal history of nicotine dependence: Secondary | ICD-10-CM | POA: Diagnosis not present

## 2024-02-23 DIAGNOSIS — Z79899 Other long term (current) drug therapy: Secondary | ICD-10-CM | POA: Diagnosis not present

## 2024-02-23 NOTE — Progress Notes (Signed)
 [facility] [address] [place] [address]    Clinic Day:  02/23/2024  Referring physician: Marvine Rush, MD  Patient Care Team: Marvine Rush, MD as PCP - General (Family Medicine) Mallipeddi, Diannah SQUIBB, MD as PCP - Cardiology (Cardiology) Cindie Carlin POUR, DO as Consulting Physician (Internal Medicine) Darlean Ozell NOVAK, MD as Consulting Physician (Pulmonary Disease)   ASSESSMENT & PLAN:   Assessment: 1. [name]: - BMBX on 01/11/2018 shows hypercellular (50 to 70% cellularity) marrow with trilineage hematopoiesis.  Reticulin stain showed mild increase in reticulin fibers.  Chromosome analysis shows 93, XX. - No prior history of vasomotor symptoms or thrombosis.  Given her age more than 62, she was recommended to start on hydroxyurea . - She takes hydroxyurea  500 mg daily started on 12/26/2017, increased to 2 tablets daily on 02/07/2018. -On 07/22/2018, her platelet count dropped to 72.  Hence we will cut back the dose of Hydrea  to 2 tablets on Monday Wednesdays and Fridays and 1 tablet on the other days. - Hydroxyurea  dose reduced to 1 tablet daily on 09/19/2021, dose reduced to 1 tablet Monday through Friday on 05/22/2022   2.  Sleeplessness: - She is taking Xanax  0.25 mg as needed for sleep which is helping.   3.  Lung nodules: -Patient has a longtime smoking history. -CT of the chest on 03/25/2019 showed multiple small pulmonary nodules are again noted in the lungs bilaterally, largest of which is in the posterior aspect of the right upper lobe near the apex.  -CT of the chest on 04/02/2020 showed lung RADS 2 follow-up in 1 year    Plan: 1.  [name]: - She is tolerating hydroxyurea  1 tablet daily Monday through Friday very well. - Denies any aqua genic pruritus or erythromelalgia's.  No recent infections.  Mild itching in the back after shower which goes away within few minutes stable. - Labs from 02/17/2024: Normal LFTs.  LDH  is normal.  CBC: Platelet count 315, hematocrit 41.  White count is normal. - Recommend continuing hydroxyurea  500 mg daily Monday through Friday. - Recommend follow-up in 6 months with repeat labs.   2.  Smoking history: - Last CT lung cancer scan on 05/21/2023 was BI-RADS Category 2 S.  We will arrange for another scan in October.      Orders Placed This Encounter  Procedures   CT CHEST LUNG CA SCREEN LOW DOSE W/O CM    Standing Status:   Future    Expected Date:   05/20/2024    Expiration Date:   02/22/2025    Preferred Imaging Location?:   [facility]   CBC with Differential    Standing Status:   Future    Expected Date:   08/22/2024    Expiration Date:   11/20/2024   Comprehensive metabolic panel    Standing Status:   Future    Expected Date:   08/22/2024    Expiration Date:   11/20/2024   Lactate dehydrogenase    Standing Status:   Future    Expected Date:   08/22/2024    Expiration Date:   11/20/2024      LILLETTE Hummingbird R Teague,acting as a scribe for Alean Stands, MD.,have documented all relevant documentation on the behalf of Alean Stands, MD,as directed by  Alean Stands, MD while in the presence of Alean Stands, MD.  I, Alean Stands MD, have reviewed the above documentation for accuracy and completeness, and I agree  with the above.    Alean Stands, MD   7/15/20253:45 PM  CHIEF COMPLAINT:   Diagnosis:  JAK2 positive polycythemia    Cancer Staging  No matching staging information was found for the patient.    Prior Therapy: none  Current Therapy:  hydrea     HISTORY OF PRESENT ILLNESS:   Oncology History   No history exists.     INTERVAL HISTORY:   [place] is a 68 y.o. female presenting to clinic today for follow up of  JAK2 positive polycythemia. She was last seen by me on 10/14/23.  Today, she states that she is doing well overall. Her appetite level is at 100%. Her energy level is at 75%. Arabella is taking hydrea   as prescribed. She denies any infections in the last 6 months or erythromelalgia. She does note mild aquagenic pruritus in the form of itching a few minutes after showering.   PAST MEDICAL HISTORY:   Past Medical History: Past Medical History:  Diagnosis Date   Carotid artery aneurysm (HCC)    Coil embolization of a supraclinoid right internal carotid artery - Dr. Lanis   Hyperlipidemia    Nicotine  addiction 05/15/2015   Will try patch   Serum calcium  elevated 11/19/2017   recheck   Serum potassium elevated 11/19/2017   Stroke [facility])     Surgical History: Past Surgical History:  Procedure Laterality Date   ABDOMINAL AORTAGRAM N/A 11/24/2014   Procedure: ABDOMINAL EZELLA;  Surgeon: Carlin FORBES Haddock, MD;  Location: [facility] CATH LAB;  Service: Cardiovascular;  Laterality: N/A;   Aneursym repair  10/2014   COLONOSCOPY N/A 07/30/2015   Procedure: COLONOSCOPY;  Surgeon: Margo LITTIE Haddock, MD;  Location: [facility] ENDO SUITE;  Service: Endoscopy;  Laterality: N/A;  1:00 Pm - moved to 1:30 - office to notify   FRACTURE SURGERY     Jaw   LOWER EXTREMITY ANGIOGRAM  11/24/2014   Procedure: LOWER EXTREMITY ANGIOGRAM;  Surgeon: Carlin FORBES Haddock, MD;  Location: [facility] CATH LAB;  Service: Cardiovascular;;   MOUTH SURGERY     RADIOLOGY WITH ANESTHESIA N/A 10/12/2014   Procedure: Embolization/arteriogram;  Surgeon: Gerldine Lanis, MD;  Location: [facility] OR;  Service: Radiology;  Laterality: N/A;    Social History: Social History   Socioeconomic History   Marital status: Divorced    Spouse name: Not on file   Number of children: Not on file   Years of education: Not on file   Highest education level: Not on file  Occupational History   Not on file  Tobacco Use   Smoking status: Every Day    Current packs/day: 0.50    Average packs/day: 0.5 packs/day for 30.0 years (15.0 ttl pk-yrs)    Types: Cigarettes   Smokeless tobacco: Never  Substance and Sexual Activity   Alcohol use: No    Alcohol/week: 0.0  standard drinks of alcohol   Drug use: No   Sexual activity: Yes    Birth control/protection: Post-menopausal  Other Topics Concern   Not on file  Social History Narrative   Not on file   Social Drivers of Health   Financial Resource Strain: Not on file  Food Insecurity: No Food Insecurity (09/12/2023)   Hunger Vital Sign    Worried About Running Out of Food in the Last Year: Never true    Ran Out of Food in the Last Year: Never true  Transportation Needs: No Transportation Needs (09/12/2023)   PRAPARE - Administrator, Civil Service (Medical): No  Lack of Transportation (Non-Medical): No  Physical Activity: Not on file  Stress: Not on file  Social Connections: Not on file  Intimate Partner Violence: Not At Risk (09/12/2023)   Humiliation, Afraid, Rape, and Kick questionnaire    Fear of Current or Ex-Partner: No    Emotionally Abused: No    Physically Abused: No    Sexually Abused: No    Family History: Family History  Problem Relation Age of Onset   Heart attack Mother    Diabetes Mother    Throat cancer Father    Hypertension Father    Hyperlipidemia Sister    Diabetes Maternal Grandmother    Cancer Maternal Grandfather    Heart attack Paternal Grandfather    Hyperlipidemia Sister     Current Medications:  Current Outpatient Medications:    acetaminophen  (TYLENOL ) 500 MG tablet, Take 1,000 mg by mouth every 6 (six) hours as needed for mild pain or moderate pain., Disp: , Rfl:    albuterol  (VENTOLIN  HFA) 108 (90 Base) MCG/ACT inhaler, Inhale 2 puffs into the lungs every 6 (six) hours as needed for wheezing or shortness of breath., Disp: 8 g, Rfl: 2   alendronate (FOSAMAX) 70 MG tablet, Take 70 mg by mouth once a week., Disp: , Rfl:    ALPRAZolam  (XANAX ) 0.5 MG tablet, Take 1 tablet (0.5 mg total) by mouth at bedtime., Disp: 30 tablet, Rfl: 0   Artificial Tear Ointment (DRY EYES OP), Apply 1 drop to eye daily., Disp: , Rfl:    aspirin  EC 81 MG tablet, Take  81 mg by mouth daily., Disp: , Rfl:    benzonatate (TESSALON) 200 MG capsule, Take 200 mg by mouth 3 (three) times daily as needed for cough., Disp: , Rfl:    bismuth  subsalicylate (PEPTO BISMOL) 262 MG/15ML suspension, Take 30 mLs by mouth every 6 (six) hours as needed for indigestion or diarrhea or loose stools., Disp: , Rfl:    Calcium  Carb-Cholecalciferol (CALCIUM +D3) 500-15 MG-MCG TABS, Take 1 tablet by mouth daily., Disp: , Rfl:    Cholecalciferol (VITAMIN D ) 50 MCG (2000 UT) tablet, Take 2,000 Units by mouth daily., Disp: , Rfl:    hydroxyurea  (HYDREA ) 500 MG capsule, TAKE 1 CAPSULE BY MOUTH DAILY  MAY TAKE WITH FOOD TO MINIMIZE  GI SIDE EFFECTS (Patient taking differently: Take 500 mg by mouth daily. Monday through Friday), Disp: 30 capsule, Rfl: 3   Multiple Vitamins-Minerals (MULTIVITAMIN WITH MINERALS) tablet, Take 1 tablet by mouth daily., Disp: , Rfl:    Omega-3 Fatty Acids (FISH OIL) 500 MG CAPS, Take 500 mg by mouth once. , Disp: , Rfl:    PSEUDOEPH-BROMPHEN-HYDROCODONE  PO, Take 7.5 mLs by mouth 2 (two) times daily as needed (cough)., Disp: , Rfl:    rosuvastatin  (CRESTOR ) 5 MG tablet, Take 5 mg by mouth daily., Disp: , Rfl:    Allergies: No Known Allergies  REVIEW OF SYSTEMS:   Review of Systems  Constitutional:  Negative for chills, fatigue and fever.  HENT:   Negative for lump/mass, mouth sores, nosebleeds, sore throat and trouble swallowing.   Eyes:  Negative for eye problems.  Respiratory:  Negative for cough and shortness of breath.   Cardiovascular:  Negative for chest pain, leg swelling and palpitations.  Gastrointestinal:  Negative for abdominal pain, constipation, diarrhea, nausea and vomiting.  Genitourinary:  Negative for bladder incontinence, difficulty urinating, dysuria, frequency, hematuria and nocturia.   Musculoskeletal:  Negative for arthralgias, back pain, flank pain, myalgias and neck pain.  Skin:  Negative for itching and rash.  Neurological:  Negative  for dizziness, headaches and numbness.  Hematological:  Does not bruise/bleed easily.  Psychiatric/Behavioral:  Positive for sleep disturbance. Negative for depression and suicidal ideas. The patient is not nervous/anxious.   All other systems reviewed and are negative.    VITALS:   Blood pressure 137/78, pulse 61, temperature 98.2 F (36.8 C), temperature source Oral, resp. rate 16, weight 147 lb 11.2 oz (67 kg), SpO2 97%.  Wt Readings from Last 3 Encounters:  02/23/24 147 lb 11.2 oz (67 kg)  01/13/24 146 lb 9.6 oz (66.5 kg)  10/20/23 141 lb 3.2 oz (64 kg)    Body mass index is 23.13 kg/m.  Performance status (ECOG): 1 - Symptomatic but completely ambulatory  PHYSICAL EXAM:   Physical Exam Vitals and nursing note reviewed. Exam conducted with a chaperone present.  Constitutional:      Appearance: Normal appearance.  Cardiovascular:     Rate and Rhythm: Normal rate and regular rhythm.     Pulses: Normal pulses.     Heart sounds: Normal heart sounds.  Pulmonary:     Effort: Pulmonary effort is normal.     Breath sounds: Normal breath sounds.  Abdominal:     Palpations: Abdomen is soft. There is no hepatomegaly, splenomegaly or mass.     Tenderness: There is no abdominal tenderness.  Musculoskeletal:     Right lower leg: No edema.     Left lower leg: No edema.  Lymphadenopathy:     Cervical: No cervical adenopathy.     Right cervical: No superficial, deep or posterior cervical adenopathy.    Left cervical: No superficial, deep or posterior cervical adenopathy.     Upper Body:     Right upper body: No supraclavicular or axillary adenopathy.     Left upper body: No supraclavicular or axillary adenopathy.  Neurological:     General: No focal deficit present.     Mental Status: She is alert and oriented to person, place, and time.  Psychiatric:        Mood and Affect: Mood normal.        Behavior: Behavior normal.     LABS:      Latest Ref Rng & Units 02/17/2024     8:52 AM 10/07/2023    1:08 PM 09/12/2023    4:22 AM  CBC  WBC 4.0 - 10.5 K/uL 5.9  8.5  8.7   Hemoglobin 12.0 - 15.0 g/dL 86.1  86.6  87.7   Hematocrit 36.0 - 46.0 % 41.1  40.6  35.7   Platelets 150 - 400 K/uL 315  286  272       Latest Ref Rng & Units 02/17/2024    8:52 AM 10/07/2023    1:08 PM 09/13/2023    6:57 AM  CMP  Glucose 70 - 99 mg/dL 899  886  878   BUN 8 - 23 mg/dL 10  13  13    Creatinine 0.44 - 1.00 mg/dL 9.30  9.32  9.34   Sodium 135 - 145 mmol/L 140  138  141   Potassium 3.5 - 5.1 mmol/L 4.9  4.7  5.1   Chloride 98 - 111 mmol/L 105  103  106   CO2 22 - 32 mmol/L 27  25  23    Calcium  8.9 - 10.3 mg/dL 9.2  9.7  9.3   Total Protein 6.5 - 8.1 g/dL 7.4  7.4    Total Bilirubin 0.0 - 1.2 mg/dL  1.0  0.6    Alkaline Phos 38 - 126 U/L 52  49    AST 15 - 41 U/L 24  19    ALT 0 - 44 U/L 18  17       No results found for: CEA1, CEA / No results found for: CEA1, CEA No results found for: PSA1 No results found for: CAN199 No results found for: CAN125  No results found for: TOTALPROTELP, ALBUMINELP, A1GS, A2GS, BETS, BETA2SER, GAMS, MSPIKE, SPEI No results found for: TIBC, FERRITIN, IRONPCTSAT Lab Results  Component Value Date   LDH 173 02/17/2024   LDH 172 10/07/2023   LDH 213 (H) 05/13/2023     STUDIES:   No results found.

## 2024-02-23 NOTE — Patient Instructions (Signed)
 [facility] at [facility] Discharge Instructions   You were seen and examined today by Dr. Rogers.  He reviewed the results of your lab work which are normal/stable.   Continue Hydrea  500 mg daily Monday-Friday.   We will see you back in 6 months. We will repeat lab work prior to this visit.    Return as scheduled.    Thank you for choosing [facility] at [facility] to provide your oncology and hematology care.  To afford each patient quality time with our provider, please arrive at least 15 minutes before your scheduled appointment time.   If you have a lab appointment with the Cancer Center please come in thru the Main Entrance and check in at the main information desk.  You need to re-schedule your appointment should you arrive 10 or more minutes late.  We strive to give you quality time with our providers, and arriving late affects you and other patients whose appointments are after yours.  Also, if you no show three or more times for appointments you may be dismissed from the clinic at the providers discretion.     Again, thank you for choosing [facility].  Our hope is that these requests will decrease the amount of time that you wait before being seen by our physicians.       _____________________________________________________________  Should you have questions after your visit to [facility], please contact our office at [phone] and follow the prompts.  Our office hours are 8:00 a.m. and 4:30 p.m. Monday - Friday.  Please note that voicemails left after 4:00 p.m. may not be returned until the following business day.  We are closed weekends and major holidays.  You do have access to a nurse 24-7, just call the main number to the clinic [phone] and do not press any options, hold on the line and a nurse will answer the phone.    For prescription refill requests, have your pharmacy contact our  office and allow 72 hours.    Due to Covid, you will need to wear a mask upon entering the hospital. If you do not have a mask, a mask will be given to you at the Main Entrance upon arrival. For doctor visits, patients may have 1 support person age 25 or older with them. For treatment visits, patients can not have anyone with them due to social distancing guidelines and our immunocompromised population.

## 2024-02-24 ENCOUNTER — Ambulatory Visit: Admitting: Hematology

## 2024-02-25 DIAGNOSIS — H04123 Dry eye syndrome of bilateral lacrimal glands: Secondary | ICD-10-CM | POA: Diagnosis not present

## 2024-03-02 ENCOUNTER — Ambulatory Visit (HOSPITAL_COMMUNITY)
Admission: RE | Admit: 2024-03-02 | Discharge: 2024-03-02 | Disposition: A | Discharge status: Home / Self Care | Payer: Self-pay | Source: Ambulatory Visit | Attending: Internal Medicine | Admitting: Internal Medicine

## 2024-03-02 DIAGNOSIS — I2510 Atherosclerotic heart disease of native coronary artery without angina pectoris: Secondary | ICD-10-CM | POA: Insufficient documentation

## 2024-03-30 DIAGNOSIS — J439 Emphysema, unspecified: Secondary | ICD-10-CM | POA: Diagnosis not present

## 2024-03-30 DIAGNOSIS — I671 Cerebral aneurysm, nonruptured: Secondary | ICD-10-CM | POA: Diagnosis not present

## 2024-03-30 DIAGNOSIS — H2513 Age-related nuclear cataract, bilateral: Secondary | ICD-10-CM | POA: Diagnosis not present

## 2024-03-30 DIAGNOSIS — Z0001 Encounter for general adult medical examination with abnormal findings: Secondary | ICD-10-CM | POA: Diagnosis not present

## 2024-03-30 DIAGNOSIS — H43813 Vitreous degeneration, bilateral: Secondary | ICD-10-CM | POA: Diagnosis not present

## 2024-03-30 DIAGNOSIS — M5030 Other cervical disc degeneration, unspecified cervical region: Secondary | ICD-10-CM | POA: Diagnosis not present

## 2024-03-30 DIAGNOSIS — Z83511 Family history of glaucoma: Secondary | ICD-10-CM | POA: Diagnosis not present

## 2024-03-30 DIAGNOSIS — H40003 Preglaucoma, unspecified, bilateral: Secondary | ICD-10-CM | POA: Diagnosis not present

## 2024-03-30 DIAGNOSIS — D45 Polycythemia vera: Secondary | ICD-10-CM | POA: Diagnosis not present

## 2024-04-04 DIAGNOSIS — R7309 Other abnormal glucose: Secondary | ICD-10-CM | POA: Diagnosis not present

## 2024-05-02 ENCOUNTER — Other Ambulatory Visit (HOSPITAL_COMMUNITY): Payer: Self-pay | Admitting: Adult Health

## 2024-05-02 DIAGNOSIS — Z1231 Encounter for screening mammogram for malignant neoplasm of breast: Secondary | ICD-10-CM

## 2024-05-18 ENCOUNTER — Ambulatory Visit (HOSPITAL_COMMUNITY)
Admission: RE | Admit: 2024-05-18 | Discharge: 2024-05-18 | Disposition: A | Discharge status: Home / Self Care | Source: Ambulatory Visit | Attending: Hematology | Admitting: Hematology

## 2024-05-18 ENCOUNTER — Ambulatory Visit (HOSPITAL_COMMUNITY)
Admission: RE | Admit: 2024-05-18 | Discharge: 2024-05-18 | Disposition: A | Discharge status: Home / Self Care | Source: Ambulatory Visit | Attending: Adult Health | Admitting: Adult Health

## 2024-05-18 ENCOUNTER — Encounter (HOSPITAL_COMMUNITY): Payer: Self-pay

## 2024-05-18 DIAGNOSIS — D3501 Benign neoplasm of right adrenal gland: Secondary | ICD-10-CM | POA: Insufficient documentation

## 2024-05-18 DIAGNOSIS — I700 Atherosclerosis of aorta: Secondary | ICD-10-CM | POA: Insufficient documentation

## 2024-05-18 DIAGNOSIS — F17210 Nicotine dependence, cigarettes, uncomplicated: Secondary | ICD-10-CM | POA: Diagnosis not present

## 2024-05-18 DIAGNOSIS — J439 Emphysema, unspecified: Secondary | ICD-10-CM | POA: Insufficient documentation

## 2024-05-18 DIAGNOSIS — Z1231 Encounter for screening mammogram for malignant neoplasm of breast: Secondary | ICD-10-CM | POA: Diagnosis not present

## 2024-05-18 DIAGNOSIS — Z122 Encounter for screening for malignant neoplasm of respiratory organs: Secondary | ICD-10-CM | POA: Insufficient documentation

## 2024-05-18 DIAGNOSIS — I2510 Atherosclerotic heart disease of native coronary artery without angina pectoris: Secondary | ICD-10-CM | POA: Diagnosis not present

## 2024-05-18 DIAGNOSIS — Z87891 Personal history of nicotine dependence: Secondary | ICD-10-CM | POA: Diagnosis not present

## 2024-05-20 ENCOUNTER — Ambulatory Visit: Payer: Self-pay | Admitting: Adult Health

## 2024-06-06 DIAGNOSIS — X32XXXD Exposure to sunlight, subsequent encounter: Secondary | ICD-10-CM | POA: Diagnosis not present

## 2024-06-06 DIAGNOSIS — L570 Actinic keratosis: Secondary | ICD-10-CM | POA: Diagnosis not present

## 2024-07-13 ENCOUNTER — Ambulatory Visit: Admitting: Internal Medicine

## 2024-07-13 ENCOUNTER — Encounter: Payer: Self-pay | Admitting: Internal Medicine

## 2024-07-13 ENCOUNTER — Ambulatory Visit (HOSPITAL_COMMUNITY)
Admission: RE | Admit: 2024-07-13 | Discharge: 2024-07-13 | Disposition: A | Source: Ambulatory Visit | Attending: Internal Medicine

## 2024-07-13 VITALS — BP 142/74 | HR 64 | Ht 64.0 in | Wt 151.4 lb

## 2024-07-13 DIAGNOSIS — E782 Mixed hyperlipidemia: Secondary | ICD-10-CM

## 2024-07-13 DIAGNOSIS — Z8679 Personal history of other diseases of the circulatory system: Secondary | ICD-10-CM

## 2024-07-13 DIAGNOSIS — M5030 Other cervical disc degeneration, unspecified cervical region: Secondary | ICD-10-CM | POA: Insufficient documentation

## 2024-07-13 DIAGNOSIS — M810 Age-related osteoporosis without current pathological fracture: Secondary | ICD-10-CM | POA: Insufficient documentation

## 2024-07-13 DIAGNOSIS — J4489 Other specified chronic obstructive pulmonary disease: Secondary | ICD-10-CM | POA: Diagnosis not present

## 2024-07-13 DIAGNOSIS — F5101 Primary insomnia: Secondary | ICD-10-CM | POA: Insufficient documentation

## 2024-07-13 DIAGNOSIS — Z23 Encounter for immunization: Secondary | ICD-10-CM | POA: Diagnosis not present

## 2024-07-13 DIAGNOSIS — D45 Polycythemia vera: Secondary | ICD-10-CM

## 2024-07-13 DIAGNOSIS — I10 Essential (primary) hypertension: Secondary | ICD-10-CM | POA: Insufficient documentation

## 2024-07-13 DIAGNOSIS — Z9889 Other specified postprocedural states: Secondary | ICD-10-CM

## 2024-07-13 MED ORDER — TRAZODONE HCL 50 MG PO TABS: 25.0000 mg | ORAL_TABLET | Freq: Every evening | ORAL | 3 refills | Status: DC | PRN

## 2024-07-13 MED ORDER — ROSUVASTATIN CALCIUM 5 MG PO TABS: 5.0000 mg | ORAL_TABLET | Freq: Every day | ORAL | 3 refills | Status: AC

## 2024-07-13 MED ORDER — ALENDRONATE SODIUM 70 MG PO TABS: 70.0000 mg | ORAL_TABLET | ORAL | 3 refills | Status: AC

## 2024-07-13 NOTE — Assessment & Plan Note (Signed)
 Chronic neck pain, likely due to DDD of cervical spine Check x-ray of cervical spine Tylenol  arthritis as needed for pain

## 2024-07-13 NOTE — Assessment & Plan Note (Addendum)
 By Dr Lanis in 2016 Has intermittent headache - if worsening, will refer her to Neurosurgery again

## 2024-07-13 NOTE — Patient Instructions (Signed)
 Please start taking Trazodone as needed for insomnia.  Please maintain simple sleep hygiene. - Maintain dark and non-noisy environment in the bedroom. - Please use the bedroom for sleep and sexual activity only. - Do not use electronic devices in the bedroom. - Please take dinner at least 2 hours before bedtime. - Please avoid caffeinated products in the evening, including coffee, soft drinks. - Please try to maintain the regular sleep-wake cycle - Go to bed and wake up at the same time.  Please continue to take other medications as prescribed.  Please continue to follow low salt diet and perform moderate exercise/walking as tolerated.

## 2024-07-13 NOTE — Assessment & Plan Note (Signed)
 BP Readings from Last 1 Encounters:  07/13/24 (!) 142/74   Uncontrolled Advised to check BP at home and contact if persistently above 140/90 Advised DASH diet and moderate exercise/walking, at least 150 mins/week Will evaluate after 3 months with home BP readings - if persistent elevation despite lifestyle modification, will add ARB

## 2024-07-13 NOTE — Assessment & Plan Note (Addendum)
 On Crestor  5 mg QD Last lipid profile reviewed - LDL improved

## 2024-07-13 NOTE — Assessment & Plan Note (Signed)
Followed by hematology.  On hydroxyurea.   

## 2024-07-13 NOTE — Assessment & Plan Note (Signed)
 Last DEXA scan showed severe osteoporosis in lumbar spine area On alendronate and vitamin D  supplement

## 2024-07-13 NOTE — Assessment & Plan Note (Signed)
 Well-controlled with albuterol  as needed, requires it rarely Quit smoking in 02/25

## 2024-07-13 NOTE — Progress Notes (Signed)
 New Patient Office Visit  Subjective:  Patient ID: Denise Maynard, female    DOB: 09-07-1955  Age: 68 y.o. MRN: [id number]  CC:  Chief Complaint  Patient presents with   Establish Care    Previously seeing Dr. Marvine, establishing care today.    Insomnia    Does not sleep well at night currently taking alprazolam  would like to discuss other options.     HPI Denise Maynard is a 68 y.o. female with past medical history of cerebral aneurysm, thrombocytosis, polycythemia vera, asthmatic bronchitis, osteoporosis, DDD of cervical spine and HLD who presents for establishing care.  Her BP was elevated today on multiple measurements.  She denies history of hypertension.  Denies any dizziness, chest pain or palpitations.  She has been evaluated by cardiology for coronary calcifications noted on CT chest.  She has had CT coronary, which showed 56th percentile score. She takes Crestor  for HLD.  Thrombocytosis and polycythemia vera: Followed by hematology clinic.  She is on hydroxyurea  once daily from M-F.  She has intermittent itching after hot shower at times.  Asthmatic bronchitis: She has been evaluated by pulmonology after an episode of acute bronchitis/COPD exacerbation related hospitalization in 01/25.  She currently uses albuterol  as needed for dyspnea, but has not needed it frequently.  She has quit smoking since 02/25.  Osteoporosis: Her last DEXA scan showed osteoporosis in lumbar spine area.  She currently takes alendronate once weekly.  She also takes calcium  and vitamin D  supplement.  Chronic neck pain: She reports chronic neck pain, which has been worse recently.  Her pain is constant, dull, radiating to bilateral sides and worse with neck movement.  Denies any numbness or tingling of the UE currently.  Insomnia: She complains of persistent insomnia, especially with difficulty maintaining sleep.  She currently takes Xanax  0.5 mg nightly as needed, but tries to avoid daily dosing.   She has not tried any other medicine except melatonin for insomnia.    Past Medical History:  Diagnosis Date   Carotid artery aneurysm    Coil embolization of a supraclinoid right internal carotid artery - Dr. Lanis   Hyperlipidemia    Nicotine  addiction 05/15/2015   Will try patch   Serum calcium  elevated 11/19/2017   recheck   Serum potassium elevated 11/19/2017   Stroke [facility])     Past Surgical History:  Procedure Laterality Date   ABDOMINAL AORTAGRAM N/A 11/24/2014   Procedure: ABDOMINAL EZELLA;  Surgeon: Carlin FORBES Haddock, MD;  Location: [facility] CATH LAB;  Service: Cardiovascular;  Laterality: N/A;   Aneursym repair  10/2014   COLONOSCOPY N/A 07/30/2015   Procedure: COLONOSCOPY;  Surgeon: Margo LITTIE Haddock, MD;  Location: [facility] ENDO SUITE;  Service: Endoscopy;  Laterality: N/A;  1:00 Pm - moved to 1:30 - office to notify   FRACTURE SURGERY     Jaw   LOWER EXTREMITY ANGIOGRAM  11/24/2014   Procedure: LOWER EXTREMITY ANGIOGRAM;  Surgeon: Carlin FORBES Haddock, MD;  Location: [facility] CATH LAB;  Service: Cardiovascular;;   MOUTH SURGERY     RADIOLOGY WITH ANESTHESIA N/A 10/12/2014   Procedure: Embolization/arteriogram;  Surgeon: Gerldine Lanis, MD;  Location: [facility] OR;  Service: Radiology;  Laterality: N/A;    Family History  Problem Relation Age of Onset   Heart attack Mother    Diabetes Mother    Throat cancer Father    Hypertension Father    Hyperlipidemia Sister    Diabetes Maternal Grandmother    Cancer Maternal Grandfather  Heart attack Paternal Grandfather    Hyperlipidemia Sister     Social History   Socioeconomic History   Marital status: Divorced    Spouse name: Not on file   Number of children: Not on file   Years of education: Not on file   Highest education level: 12th grade  Occupational History   Not on file  Tobacco Use   Smoking status: Every Day    Current packs/day: 0.50    Average packs/day: 0.5 packs/day for 30.0 years (15.0 ttl pk-yrs)    Types:  Cigarettes   Smokeless tobacco: Never  Substance and Sexual Activity   Alcohol use: No    Alcohol/week: 0.0 standard drinks of alcohol   Drug use: No   Sexual activity: Yes    Birth control/protection: Post-menopausal  Other Topics Concern   Not on file  Social History Narrative   Not on file   Social Drivers of Health   Financial Resource Strain: Low Risk  (07/06/2024)   Overall Financial Resource Strain (CARDIA)    Difficulty of Paying Living Expenses: Not hard at all  Food Insecurity: No Food Insecurity (07/06/2024)   Hunger Vital Sign    Worried About Running Out of Food in the Last Year: Never true    Ran Out of Food in the Last Year: Never true  Transportation Needs: No Transportation Needs (07/06/2024)   PRAPARE - Administrator, Civil Service (Medical): No    Lack of Transportation (Non-Medical): No  Physical Activity: Insufficiently Active (07/06/2024)   Exercise Vital Sign    Days of Exercise per Week: 2 days    Minutes of Exercise per Session: 20 min  Stress: Stress Concern Present (07/06/2024)   [employer] of Occupational Health - Occupational Stress Questionnaire    Feeling of Stress: Rather much  Social Connections: Moderately Isolated (07/06/2024)   Social Connection and Isolation Panel    Frequency of Communication with Friends and Family: More than three times a week    Frequency of Social Gatherings with Friends and Family: More than three times a week    Attends Religious Services: 1 to 4 times per year    Active Member of [employer] or Organizations: No    Attends Banker Meetings: Not on file    Marital Status: Divorced  Intimate Partner Violence: Not At Risk (09/12/2023)   Humiliation, Afraid, Rape, and Kick questionnaire    Fear of Current or Ex-Partner: No    Emotionally Abused: No    Physically Abused: No    Sexually Abused: No    ROS Review of Systems  Constitutional:  Negative for chills and fever.  HENT:  Negative  for congestion and sore throat.   Eyes:  Negative for pain and discharge.  Respiratory:  Negative for cough and shortness of breath.   Cardiovascular:  Negative for chest pain and palpitations.  Gastrointestinal:  Negative for abdominal pain, diarrhea, nausea and vomiting.  Endocrine: Negative for polydipsia and polyuria.  Genitourinary:  Negative for dysuria and hematuria.  Musculoskeletal:  Positive for neck pain. Negative for neck stiffness.  Skin:  Negative for rash.  Neurological:  Negative for dizziness and weakness.  Psychiatric/Behavioral:  Positive for sleep disturbance. Negative for agitation and behavioral problems.     Objective:   Today's Vitals: BP (!) 142/74 (BP Location: Right Arm)   Pulse 64   Ht 5' 4 (1.626 m)   Wt 151 lb 6.4 oz (68.7 kg)   SpO2 93%  BMI 25.99 kg/m   Physical Exam Vitals reviewed.  Constitutional:      General: She is not in acute distress.    Appearance: She is not diaphoretic.  HENT:     Head: Normocephalic and atraumatic.     Nose: Nose normal.     Mouth/Throat:     Mouth: Mucous membranes are moist.  Eyes:     General: No scleral icterus.    Extraocular Movements: Extraocular movements intact.  Cardiovascular:     Rate and Rhythm: Normal rate and regular rhythm.     Heart sounds: Normal heart sounds. No murmur heard. Pulmonary:     Breath sounds: Normal breath sounds. No wheezing or rales.  Abdominal:     Palpations: Abdomen is soft.     Tenderness: There is no abdominal tenderness.  Musculoskeletal:     Cervical back: Neck supple. Tenderness present. Pain with movement present.     Right lower leg: No edema.     Left lower leg: No edema.  Skin:    General: Skin is warm.     Findings: No rash.  Neurological:     General: No focal deficit present.     Mental Status: She is alert and oriented to person, place, and time.     Sensory: No sensory deficit.     Motor: No weakness.  Psychiatric:        Mood and Affect: Mood  normal.        Behavior: Behavior normal.     Assessment & Plan:   Problem List Items Addressed This Visit       Cardiovascular and Mediastinum   Essential hypertension   BP Readings from Last 1 Encounters:  07/13/24 (!) 142/74   Uncontrolled Advised to check BP at home and contact if persistently above 140/90 Advised DASH diet and moderate exercise/walking, at least 150 mins/week Will evaluate after 3 months with home BP readings - if persistent elevation despite lifestyle modification, will add ARB      Relevant Medications   rosuvastatin  (CRESTOR ) 5 MG tablet     Respiratory   Asthmatic bronchitis , chronic (HCC) - Primary   Well-controlled with albuterol  as needed, requires it rarely Quit smoking in 02/25        Musculoskeletal and Integument   DDD (degenerative disc disease), cervical   Chronic neck pain, likely due to DDD of cervical spine Check x-ray of cervical spine Tylenol  arthritis as needed for pain      Relevant Orders   DG Cervical Spine Complete   Osteoporosis without current pathological fracture   Last DEXA scan showed severe osteoporosis in lumbar spine area On alendronate  and vitamin D  supplement      Relevant Medications   alendronate  (FOSAMAX ) 70 MG tablet     Other   History of cerebral aneurysm repair   By Dr Lanis in 2016 Has intermittent headache - if worsening, will refer her to Neurosurgery again      Mixed hyperlipidemia   On Crestor  5 mg QD Last lipid profile reviewed - LDL improved      Relevant Medications   rosuvastatin  (CRESTOR ) 5 MG tablet   Polycythemia vera (HCC)   Followed by hematology On hydroxyurea       Primary insomnia   Sleep hygiene discussed, material provided Has tried Xanax  0.5 mg nightly as needed, but still has difficulty maintaining sleep Discussed about alternatives - agrees to try trazodone , prescribed trazodone  as needed for insomnia  Relevant Medications   traZODone (DESYREL) 50 MG  tablet   Other Visit Diagnoses       Encounter for immunization       Relevant Orders   Pneumococcal conjugate vaccine 20-valent (Completed)       Outpatient Encounter Medications as of 07/13/2024  Medication Sig   acetaminophen  (TYLENOL ) 500 MG tablet Take 1,000 mg by mouth every 6 (six) hours as needed for mild pain or moderate pain.   albuterol  (VENTOLIN  HFA) 108 (90 Base) MCG/ACT inhaler Inhale 2 puffs into the lungs every 6 (six) hours as needed for wheezing or shortness of breath.   ALPRAZolam  (XANAX ) 0.5 MG tablet Take 1 tablet (0.5 mg total) by mouth at bedtime.   Artificial Tear Ointment (DRY EYES OP) Apply 1 drop to eye daily.   aspirin  EC 81 MG tablet Take 81 mg by mouth daily.   Calcium  Carb-Cholecalciferol (CALCIUM +D3) 500-15 MG-MCG TABS Take 1 tablet by mouth daily.   Cholecalciferol (VITAMIN D ) 50 MCG (2000 UT) tablet Take 2,000 Units by mouth daily.   hydroxyurea  (HYDREA ) 500 MG capsule Take 1 capsule (500 mg total) by mouth daily. Monday through Friday   Multiple Vitamins-Minerals (MULTIVITAMIN WITH MINERALS) tablet Take 1 tablet by mouth daily.   Omega-3 Fatty Acids (FISH OIL) 500 MG CAPS Take 500 mg by mouth once.    traZODone (DESYREL) 50 MG tablet Take 0.5-1 tablets (25-50 mg total) by mouth at bedtime as needed for sleep.   [DISCONTINUED] alendronate (FOSAMAX) 70 MG tablet Take 70 mg by mouth once a week.   [DISCONTINUED] benzonatate (TESSALON) 200 MG capsule Take 200 mg by mouth 3 (three) times daily as needed for cough.   [DISCONTINUED] bismuth  subsalicylate (PEPTO BISMOL) 262 MG/15ML suspension Take 30 mLs by mouth every 6 (six) hours as needed for indigestion or diarrhea or loose stools.   [DISCONTINUED] PSEUDOEPH-BROMPHEN-HYDROCODONE  PO Take 7.5 mLs by mouth 2 (two) times daily as needed (cough).   [DISCONTINUED] rosuvastatin  (CRESTOR ) 5 MG tablet Take 5 mg by mouth daily.   alendronate (FOSAMAX) 70 MG tablet Take 1 tablet (70 mg total) by mouth once a week.    rosuvastatin  (CRESTOR ) 5 MG tablet Take 1 tablet (5 mg total) by mouth daily.   No facility-administered encounter medications on file as of 07/13/2024.    Follow-up: Return in about 3 months (around 10/11/2024) for Insomnia and HTN.   Suzzane MARLA Blanch, MD

## 2024-07-13 NOTE — Assessment & Plan Note (Signed)
 Sleep hygiene discussed, material provided Has tried Xanax  0.5 mg nightly as needed, but still has difficulty maintaining sleep Discussed about alternatives - agrees to try trazodone, prescribed trazodone as needed for insomnia

## 2024-07-21 ENCOUNTER — Ambulatory Visit: Payer: Self-pay | Admitting: Internal Medicine

## 2024-08-04 ENCOUNTER — Other Ambulatory Visit: Payer: Self-pay | Admitting: Internal Medicine

## 2024-08-04 DIAGNOSIS — F5101 Primary insomnia: Secondary | ICD-10-CM

## 2024-08-23 ENCOUNTER — Other Ambulatory Visit

## 2024-08-24 ENCOUNTER — Inpatient Hospital Stay: Attending: Oncology

## 2024-08-24 DIAGNOSIS — D471 Chronic myeloproliferative disease: Secondary | ICD-10-CM

## 2024-08-24 LAB — COMPREHENSIVE METABOLIC PANEL WITH GFR
ALT: 18 U/L (ref 0–44)
AST: 27 U/L (ref 15–41)
Albumin: 4.6 g/dL (ref 3.5–5.0)
Alkaline Phosphatase: 61 U/L (ref 38–126)
Anion gap: 14 (ref 5–15)
BUN: 10 mg/dL (ref 8–23)
CO2: 22 mmol/L (ref 22–32)
Calcium: 10.1 mg/dL (ref 8.9–10.3)
Chloride: 105 mmol/L (ref 98–111)
Creatinine, Ser: 0.72 mg/dL (ref 0.44–1.00)
GFR, Estimated: >60 mL/min
Glucose, Bld: 96 mg/dL (ref 70–99)
Potassium: 4.3 mmol/L (ref 3.5–5.1)
Sodium: 141 mmol/L (ref 135–145)
Total Bilirubin: 0.5 mg/dL (ref 0.0–1.2)
Total Protein: 7.6 g/dL (ref 6.5–8.1)

## 2024-08-24 LAB — CBC WITH DIFFERENTIAL/PLATELET
Abs Immature Granulocytes: 0.02 K/uL (ref 0.00–0.07)
Basophils Absolute: 0 K/uL (ref 0.0–0.1)
Basophils Relative: 0 %
Eosinophils Absolute: 0.1 K/uL (ref 0.0–0.5)
Eosinophils Relative: 1 %
HCT: 41.9 % (ref 36.0–46.0)
Hemoglobin: 13.8 g/dL (ref 12.0–15.0)
Immature Granulocytes: 0 %
Lymphocytes Relative: 29 %
Lymphs Abs: 2.2 K/uL (ref 0.7–4.0)
MCH: 34.9 pg — ABNORMAL HIGH (ref 26.0–34.0)
MCHC: 32.9 g/dL (ref 30.0–36.0)
MCV: 106.1 fL — ABNORMAL HIGH (ref 80.0–100.0)
Monocytes Absolute: 0.8 K/uL (ref 0.1–1.0)
Monocytes Relative: 10 %
Neutro Abs: 4.5 K/uL (ref 1.7–7.7)
Neutrophils Relative %: 60 %
Platelets: 306 K/uL (ref 150–400)
RBC: 3.95 MIL/uL (ref 3.87–5.11)
RDW: 13.7 % (ref 11.5–15.5)
WBC: 7.6 K/uL (ref 4.0–10.5)
nRBC: 0 % (ref 0.0–0.2)

## 2024-08-24 LAB — LACTATE DEHYDROGENASE: LDH: 243 U/L — ABNORMAL HIGH (ref 105–235)

## 2024-08-30 ENCOUNTER — Ambulatory Visit: Admitting: Oncology

## 2024-08-31 ENCOUNTER — Inpatient Hospital Stay: Admitting: Oncology

## 2024-08-31 VITALS — BP 136/73 | HR 63 | Temp 98.1°F | Resp 18 | Ht 63.0 in | Wt 154.0 lb

## 2024-08-31 DIAGNOSIS — D45 Polycythemia vera: Secondary | ICD-10-CM

## 2024-08-31 DIAGNOSIS — F17210 Nicotine dependence, cigarettes, uncomplicated: Secondary | ICD-10-CM | POA: Diagnosis not present

## 2024-08-31 NOTE — Progress Notes (Signed)
 "  [facility] OFFICE PROGRESS NOTE  Tobie Suzzane POUR, MD  ASSESSMENT & PLAN:    Assessment & Plan Polycythemia vera [facility] - She is tolerating hydroxyurea  1 tablet daily Monday through Friday very well. - Denies any aqua genic pruritus or erythromelalgia's.  No recent infections.  Mild itching in the back after shower which goes away within few minutes stable. - Labs from 08/24/2024 showed normal LFTs, creatinine, hemoglobin and white blood cell count.  MCV elevated at 106.1.  Platelet count 306.  LDH elevated at 243. - Recommend continuing hydroxyurea  500 mg daily Monday through Friday. - Recommend follow-up in 6 months with repeat labs.   Cigarette smoker - Last CT lung cancer scan on 05/21/2023 was BI-RADS Category 2 S.  We will arrange for another scan in October.    Orders Placed This Encounter  Procedures   Lactate dehydrogenase    Standing Status:   Future    Expected Date:   03/02/2025    Expiration Date:   05/29/2025   Comprehensive metabolic panel    Standing Status:   Future    Expected Date:   03/02/2025    Expiration Date:   05/29/2025   CBC with Differential    Standing Status:   Future    Expected Date:   03/02/2025    Expiration Date:   05/29/2025    INTERVAL HISTORY: Patient returns for follow-up for JAK2 positive polycythemia vera currently on hydroxyurea  1 tablet daily Monday through Friday.  In the interim, patient had CT lung cancer screening on 05/18/2024 which showed lung RADS 2 benign appearance or behavior.  Reports overall doing well.  She recently saw Dr. Tobie her new PCP for chronic comorbidities.  Appetite is 100% energy levels are 75%.  She still has trouble sleeping and was recently prescribed trazodone  which is not really helping much.  Reports she is under a lot of stress as her sister is sick and was in the hospital over Christmas.  We reviewed LDH, CMP and CBC.  SUMMARY OF HEMATOLOGIC HISTORY: Oncology History   No problem  history exists.  1. [name]: - BMBX on 01/11/2018 shows hypercellular (50 to 70% cellularity) marrow with trilineage hematopoiesis.  Reticulin stain showed mild increase in reticulin fibers.  Chromosome analysis shows 34, XX. - No prior history of vasomotor symptoms or thrombosis.  Given her age more than 41, she was recommended to start on hydroxyurea . - She takes hydroxyurea  500 mg daily started on 12/26/2017, increased to 2 tablets daily on 02/07/2018. -On 07/22/2018, her platelet count dropped to 72.  Hence we will cut back the dose of Hydrea  to 2 tablets on Monday Wednesdays and Fridays and 1 tablet on the other days. - Hydroxyurea  dose reduced to 1 tablet daily on 09/19/2021, dose reduced to 1 tablet Monday through Friday on 05/22/2022   2.  Sleeplessness: - She is taking Xanax  0.25 mg as needed for sleep which is helping.   3.  Lung nodules: -Patient has a longtime smoking history. -CT of the chest on 03/25/2019 showed multiple small pulmonary nodules are again noted in the lungs bilaterally, largest of which is in the posterior aspect of the right upper lobe near the apex.  -CT of the chest on 04/02/2020 showed lung RADS 2 follow-up in 1 year   CBC    Component Value Date/Time   WBC 7.6 08/24/2024 1332   RBC 3.95 08/24/2024 1332   HGB 13.8 08/24/2024 1332  HGB 17.2 (H) 11/30/2017 0841   HCT 41.9 08/24/2024 1332   HCT 51.4 (H) 11/30/2017 0841   PLT 306 08/24/2024 1332   PLT 900 (HH) 11/30/2017 0841   MCV 106.1 (H) 08/24/2024 1332   MCV 95 11/30/2017 0841   MCH 34.9 (H) 08/24/2024 1332   MCHC 32.9 08/24/2024 1332   RDW 13.7 08/24/2024 1332   RDW 15.7 (H) 11/30/2017 0841   LYMPHSABS 2.2 08/24/2024 1332   LYMPHSABS 2.2 11/30/2017 0841   MONOABS 0.8 08/24/2024 1332   EOSABS 0.1 08/24/2024 1332   EOSABS 0.2 11/30/2017 0841   BASOSABS 0.0 08/24/2024 1332   BASOSABS 0.1 11/30/2017 0841       Latest Ref Rng & Units 08/24/2024    1:32 PM 02/17/2024    8:52 AM  10/07/2023    1:08 PM  CMP  Glucose 70 - 99 mg/dL 96  899  886   BUN 8 - 23 mg/dL 10  10  13    Creatinine 0.44 - 1.00 mg/dL 9.27  9.30  9.32   Sodium 135 - 145 mmol/L 141  140  138   Potassium 3.5 - 5.1 mmol/L 4.3  4.9  4.7   Chloride 98 - 111 mmol/L 105  105  103   CO2 22 - 32 mmol/L 22  27  25    Calcium  8.9 - 10.3 mg/dL 89.8  9.2  9.7   Total Protein 6.5 - 8.1 g/dL 7.6  7.4  7.4   Total Bilirubin 0.0 - 1.2 mg/dL 0.5  1.0  0.6   Alkaline Phos 38 - 126 U/L 61  52  49   AST 15 - 41 U/L 27  24  19    ALT 0 - 44 U/L 18  18  17       Lab Results  Component Value Date   VITAMINB12 575 09/19/2021    Vitals:   08/31/24 1335 08/31/24 1336  BP: (!) 147/78 136/73  Pulse: 63   Resp: 18   Temp: 98.1 F (36.7 C)   SpO2: 100%     Review of System:  Review of Systems  Psychiatric/Behavioral:  The patient has insomnia.     Physical Exam: Physical Exam Constitutional:      Appearance: Normal appearance.  HENT:     Head: Normocephalic and atraumatic.  Eyes:     Pupils: Pupils are equal, round, and reactive to light.  Cardiovascular:     Rate and Rhythm: Normal rate and regular rhythm.     Heart sounds: Normal heart sounds. No murmur heard. Pulmonary:     Effort: Pulmonary effort is normal.     Breath sounds: Normal breath sounds. No wheezing.  Abdominal:     General: Bowel sounds are normal. There is no distension.     Palpations: Abdomen is soft.     Tenderness: There is no abdominal tenderness.  Musculoskeletal:        General: Normal range of motion.     Cervical back: Normal range of motion.  Skin:    General: Skin is warm and dry.     Findings: No rash.  Neurological:     Mental Status: She is alert and oriented to person, place, and time.     Gait: Gait is intact.  Psychiatric:        Mood and Affect: Mood and affect normal.        Cognition and Memory: Memory normal.        Judgment: Judgment normal.  I spent 20 minutes dedicated to the care of this  patient (face-to-face and non-face-to-face) on the date of the encounter to include what is described in the assessment and plan.,  Delon Hope, NP 08/31/2024 1:57 PM "

## 2024-08-31 NOTE — Assessment & Plan Note (Addendum)
-   Last CT lung cancer scan on 05/21/2023 was BI-RADS Category 2 S.  We will arrange for another scan in October.

## 2024-08-31 NOTE — Assessment & Plan Note (Addendum)
-   She is tolerating hydroxyurea  1 tablet daily Monday through Friday very well. - Denies any aqua genic pruritus or erythromelalgia's.  No recent infections.  Mild itching in the back after shower which goes away within few minutes stable. - Labs from 08/24/2024 showed normal LFTs, creatinine, hemoglobin and white blood cell count.  MCV elevated at 106.1.  Platelet count 306.  LDH elevated at 243. - Recommend continuing hydroxyurea  500 mg daily Monday through Friday. - Recommend follow-up in 6 months with repeat labs.

## 2024-10-12 ENCOUNTER — Ambulatory Visit: Admitting: Internal Medicine

## 2025-03-01 ENCOUNTER — Inpatient Hospital Stay

## 2025-03-02 ENCOUNTER — Inpatient Hospital Stay

## 2025-03-08 ENCOUNTER — Inpatient Hospital Stay: Admitting: Oncology

## 2025-03-09 ENCOUNTER — Inpatient Hospital Stay: Admitting: Oncology
